# Patient Record
Sex: Female | Born: 1990 | Race: White | Hispanic: Yes | Marital: Single | Smoking: Former smoker
Health system: Southern US, Community
[De-identification: ages and names within clinical notes are randomized; demographics above are authoritative.]

## PROBLEM LIST (undated history)

## (undated) ENCOUNTER — Inpatient Hospital Stay (HOSPITAL_COMMUNITY): Payer: Self-pay

## (undated) ENCOUNTER — Inpatient Hospital Stay (HOSPITAL_COMMUNITY): Payer: Medicaid Other

## (undated) ENCOUNTER — Ambulatory Visit (HOSPITAL_COMMUNITY): Admission: EM | Source: Home / Self Care

## (undated) DIAGNOSIS — K429 Umbilical hernia without obstruction or gangrene: Secondary | ICD-10-CM

## (undated) DIAGNOSIS — K8020 Calculus of gallbladder without cholecystitis without obstruction: Secondary | ICD-10-CM

## (undated) DIAGNOSIS — T8859XA Other complications of anesthesia, initial encounter: Secondary | ICD-10-CM

## (undated) DIAGNOSIS — K219 Gastro-esophageal reflux disease without esophagitis: Secondary | ICD-10-CM

## (undated) DIAGNOSIS — D509 Iron deficiency anemia, unspecified: Secondary | ICD-10-CM

## (undated) DIAGNOSIS — J45909 Unspecified asthma, uncomplicated: Secondary | ICD-10-CM

## (undated) DIAGNOSIS — T4145XA Adverse effect of unspecified anesthetic, initial encounter: Secondary | ICD-10-CM

## (undated) DIAGNOSIS — O210 Mild hyperemesis gravidarum: Secondary | ICD-10-CM

## (undated) DIAGNOSIS — R011 Cardiac murmur, unspecified: Secondary | ICD-10-CM

## (undated) HISTORY — PX: PILONIDAL CYST / SINUS EXCISION: SUR543

---
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
## 2011-04-09 LAB — DIFFERENTIAL
Basophils Absolute: 0 K/uL (ref 0.0–0.1)
Basophils Relative: 0 % (ref 0–1)
Eosinophils Absolute: 0.1 K/uL (ref 0.0–1.2)
Eosinophils Relative: 0 % (ref 0–5)
Lymphocytes Relative: 13 % — ABNORMAL LOW (ref 24–48)
Lymphs Abs: 1.8 K/uL (ref 1.1–4.8)
Monocytes Absolute: 0.2 K/uL (ref 0.2–1.2)
Monocytes Relative: 2 % — ABNORMAL LOW (ref 3–11)
Neutro Abs: 11.8 K/uL — ABNORMAL HIGH (ref 1.7–8.0)
Neutrophils Relative %: 85 % — ABNORMAL HIGH (ref 43–71)

## 2011-04-09 LAB — CBC
HCT: 42 % (ref 36.0–49.0)
Hemoglobin: 14 g/dL (ref 12.0–16.0)
MCHC: 33.4 g/dL (ref 31.0–37.0)
MCV: 89.6 fL (ref 78.0–98.0)
Platelets: 266 K/uL (ref 150–400)
RBC: 4.69 MIL/uL (ref 3.80–5.70)
RDW: 13.9 % (ref 11.4–15.5)
WBC: 13.9 K/uL — ABNORMAL HIGH (ref 4.5–13.5)

## 2011-04-09 LAB — WET PREP, GENITAL
Trich, Wet Prep: NONE SEEN
Yeast Wet Prep HPF POC: NONE SEEN

## 2011-04-09 LAB — COMPREHENSIVE METABOLIC PANEL WITH GFR
ALT: 29 U/L (ref 0–35)
AST: 27 U/L (ref 0–37)
Albumin: 4.3 g/dL (ref 3.5–5.2)
Alkaline Phosphatase: 76 U/L (ref 47–119)
BUN: 8 mg/dL (ref 6–23)
CO2: 26 meq/L (ref 19–32)
Calcium: 9.8 mg/dL (ref 8.4–10.5)
Chloride: 104 meq/L (ref 96–112)
Creatinine, Ser: 0.51 mg/dL (ref 0.4–1.2)
Glucose, Bld: 105 mg/dL — ABNORMAL HIGH (ref 70–99)
Potassium: 4.1 meq/L (ref 3.5–5.1)
Sodium: 139 meq/L (ref 135–145)
Total Bilirubin: 0.7 mg/dL (ref 0.3–1.2)
Total Protein: 7.6 g/dL (ref 6.0–8.3)

## 2011-04-09 LAB — URINALYSIS, ROUTINE W REFLEX MICROSCOPIC
Bilirubin Urine: NEGATIVE
Glucose, UA: NEGATIVE mg/dL
Hgb urine dipstick: NEGATIVE
Ketones, ur: NEGATIVE mg/dL
Nitrite: NEGATIVE
Protein, ur: NEGATIVE mg/dL
Specific Gravity, Urine: 1.021 (ref 1.005–1.030)
Urobilinogen, UA: 0.2 mg/dL (ref 0.0–1.0)
pH: 7.5 (ref 5.0–8.0)

## 2011-04-09 LAB — GC/CHLAMYDIA PROBE AMP, GENITAL
Chlamydia, DNA Probe: NEGATIVE
GC Probe Amp, Genital: POSITIVE — AB

## 2011-04-09 LAB — POCT PREGNANCY, URINE: Preg Test, Ur: NEGATIVE

## 2011-05-07 NOTE — H&P (Signed)
Caitlin Sullivan, SESMA                ACCOUNT NO.:  [id number]   MEDICAL RECORD NO.:  [id number]          PATIENT TYPE:  EMS   LOCATION:  ED                           FACILITY:  [facility]   PHYSICIAN:  Anselm Pancoast. Weatherly, M.D.DATE OF BIRTH:  September 08, 1991   DATE OF ADMISSION:  05/24/2008  DATE OF DISCHARGE:                              HISTORY & PHYSICAL   PREOPERATIVE DIAGNOSIS:  Infected pilonidal cyst.   HISTORY:  Fawne Hughley is a 20 year old Latino who presented I think to  the [facility] either ER or Urgent Care yesterday and had a pilonidal cyst  that she said started being painful on Saturday.  She was drained in the  emergency room with a little small incision to the left of midline and  then referred to the pediatric surgical clinic this morning.  Dr.  Wyline Mood, a pediatric surgeon from [facility], has a clinic here twice a  week, and he examined her and thought that the abscess was not  adequately drained but he only does nonacute cases and asked if we would  manage the problem.  I said sure and sent to the emergency room at  [facility] where I am on call.  On questioning, she says that she has  never had previous problems until this past Saturday.  She is 20 years  and is in the tenth grade and has had no problems with chronic  infection.  Her mother speaks very little [place].  The patient speaks  excellent English, and it appears that she was placed on Vicodin and  Septra last evening from the clinic.   ALLERGIES:  NONE.   CHRONIC MEDICATIONS:  None.   Her last menstrual period was approximately 1 month ago.   PHYSICAL EXAMINATION IN THE EMERGENCY ROOM:  GENERAL:  She is a pleasant  female, uncomfortable but not toxic-appearing.  VITAL SIGNS:  Temperature was 98, blood pressure 104/78, pulse 94,  respirations 18.  HEENT:  Unremarkable.  LUNGS:  Clear.  CARDIAC:  Normal sinus rhythm.  ABDOMEN:  Soft, nontender.  In the pilonidal area, there is an obvious  much inflamed  fluctuant area with a little small quarter-inch stab wound  to the right of midline about 2 cm from actually the midline area.  There is one small crypt that you can see in the midline pilonidal area.  The area is markedly inflamed and inadequately drained.  EXTREMITIES:  Unremarkable.  CNS: Physiologic.   ADMISSION IMPRESSION:  Inadequately drained pilonidal cyst.   PLAN:  We will plan on proceeding to the OR for drainage and debridement  of this pilonidal cyst, and hopefully this will not recurrent and can  heal with secondary closure.  The patient and her mother are in  agreement with this plan.           ______________________________  Anselm Pancoast. Zachery Dakins, M.D.     WJW/MEDQ  D:  05/24/2008  T:  05/24/2008  Job:  [id number]

## 2011-05-07 NOTE — Op Note (Signed)
NAMEMARISSA, Sullivan                ACCOUNT NO.:  [id number]   MEDICAL RECORD NO.:  [id number]          PATIENT TYPE:  EMS   LOCATION:  ED                           FACILITY:  [facility]   PHYSICIAN:  Anselm Pancoast. Weatherly, M.D.DATE OF BIRTH:  17-Jan-1991   DATE OF PROCEDURE:  DATE OF DISCHARGE:                               OPERATIVE REPORT   PREOPERATIVE DIAGNOSIS:  Infected pilonidal cyst.   OPERATION:  Debridement and drainage of moderate size infected pilonidal  cyst.   ANESTHESIA:  General.   POSITION:  Prone.   HISTORY:  Caitlin Sullivan is a 20 year old Latino female who presented to  [facility] via Urgent Clinic for ER yesterday and had a pilonidal cyst that  she said had only been painful about 3 days, drained with local  anesthesia.  She was referred by Dr. [facility]  today.  He called and said that this needed to be more adequately  drained.  They only do chronic non-acute issues there and could we take  care of it.  I sent her to the emergency room at [facility] and on  physical exam she has an obvious large pilonidal abscess that has not  drained adequately and the little opening is not actually draining any  fluid at this time.  I gave her a gram of Ancef.  She had been started  on Septra DS and Vicodin tablets and permission was obtained to drain  this in the operating room.  Her white count was about 12,000 and her  last menstrual period was approximately a month ago.   PROCEDURE:  The patient was positioned on the OR table, induction of  general anesthesia in a prone position, and then the buttocks were taped  apart.  I shaved or clipped the surrounding hair and then draped it  after a Betadine prep.  With pressing on the cavity, after she was  asleep, there was frank purulence that was very foul-smelling that was  cultured, aerobic and anaerobically.  The hemostat was then placed and  you could feel that there was a little pocket where they have  actually  drained and a much larger pocket more centrally, and it does go to the  midline area and you could see a little crypt right at the midline area.  I excised just to the left opposite side of midline and then sharply  debrided down all the abscess cavity wall.  There was not any actual  hair in the area.  I then switched to the opposite side and cut into  where they had drained this little vein to the area centrally and then  removed a little bit more skin on the opposite side, and then removed  all the chronic abscess cavity and etc. down to normal tissue  circumferentially right on down  periosteally to fascia over the sacrum.  I cauterized the little bleeders, no actual sutures were needed, and  then waited a few minutes after flushing and washing the area and then  re-lightly cauterized the area.  I then used Iodoform 1 inch gauze that  was soaked in additional Betadine to pack the little area and then we  put 4x4 dressings over it.  I will see her in the office either Thursday  or Friday to change the dressing  and then we will do sitz baths or soak in the tub afterwards and  hopefully this will close up, but not reoccur.  The tissue was sent for  pathology exam plus the cultures which were aerobic and anaerobic.  We  will continue all the Septra DS and the Vicodin tablets and she will be  released after a short stay if she is comfortable.           ______________________________  Anselm Pancoast. Zachery Dakins, M.D.     WJW/MEDQ  D:  05/24/2008  T:  05/24/2008  Job:  [id number]   cc:   Bunnie Pion, MD  Fax: [phone]

## 2011-07-04 ENCOUNTER — Emergency Department (HOSPITAL_COMMUNITY)
Admission: EM | Admit: 2011-07-04 | Discharge: 2011-07-04 | Disposition: A | Discharge status: Home / Self Care | Payer: Medicaid Other | Attending: Emergency Medicine | Admitting: Emergency Medicine

## 2011-07-04 DIAGNOSIS — B9689 Other specified bacterial agents as the cause of diseases classified elsewhere: Secondary | ICD-10-CM | POA: Insufficient documentation

## 2011-07-04 DIAGNOSIS — N898 Other specified noninflammatory disorders of vagina: Secondary | ICD-10-CM | POA: Insufficient documentation

## 2011-07-04 DIAGNOSIS — A499 Bacterial infection, unspecified: Secondary | ICD-10-CM | POA: Insufficient documentation

## 2011-07-04 DIAGNOSIS — N949 Unspecified condition associated with female genital organs and menstrual cycle: Secondary | ICD-10-CM | POA: Insufficient documentation

## 2011-07-04 DIAGNOSIS — N946 Dysmenorrhea, unspecified: Secondary | ICD-10-CM | POA: Insufficient documentation

## 2011-07-04 DIAGNOSIS — N390 Urinary tract infection, site not specified: Secondary | ICD-10-CM | POA: Insufficient documentation

## 2011-07-04 DIAGNOSIS — N760 Acute vaginitis: Secondary | ICD-10-CM | POA: Insufficient documentation

## 2011-07-04 LAB — POCT I-STAT, CHEM 8
BUN: 8 mg/dL (ref 6–23)
Calcium, Ion: 1.15 mmol/L (ref 1.12–1.32)
Creatinine, Ser: 0.6 mg/dL (ref 0.50–1.10)
Glucose, Bld: 85 mg/dL (ref 70–99)
TCO2: 21 mmol/L (ref 0–100)

## 2011-07-04 LAB — URINALYSIS, ROUTINE W REFLEX MICROSCOPIC
Bilirubin Urine: NEGATIVE
Specific Gravity, Urine: 1.027 (ref 1.005–1.030)
pH: 7.5 (ref 5.0–8.0)

## 2011-07-04 LAB — URINE MICROSCOPIC-ADD ON

## 2011-07-04 LAB — WET PREP, GENITAL: Yeast Wet Prep HPF POC: NONE SEEN

## 2011-09-01 ENCOUNTER — Emergency Department (HOSPITAL_COMMUNITY)
Admission: EM | Admit: 2011-09-01 | Discharge: 2011-09-02 | Disposition: A | Discharge status: Home / Self Care | Payer: Self-pay | Attending: Emergency Medicine | Admitting: Emergency Medicine

## 2011-09-01 DIAGNOSIS — L02419 Cutaneous abscess of limb, unspecified: Secondary | ICD-10-CM | POA: Insufficient documentation

## 2011-09-01 DIAGNOSIS — L03119 Cellulitis of unspecified part of limb: Secondary | ICD-10-CM | POA: Insufficient documentation

## 2011-09-19 LAB — CULTURE, ROUTINE-ABSCESS
Culture: NO GROWTH
Gram Stain: NONE SEEN

## 2011-09-19 LAB — DIFFERENTIAL
Eosinophils Absolute: 0
Lymphocytes Relative: 12 — ABNORMAL LOW
Lymphs Abs: 1.4 — ABNORMAL LOW
Monocytes Relative: 8
Neutro Abs: 9.5 — ABNORMAL HIGH
Neutrophils Relative %: 80 — ABNORMAL HIGH

## 2011-09-19 LAB — POCT PREGNANCY, URINE
Operator id: 231701
Preg Test, Ur: NEGATIVE

## 2011-09-19 LAB — COMPREHENSIVE METABOLIC PANEL
ALT: 31
Calcium: 9.4
Glucose, Bld: 101 — ABNORMAL HIGH
Sodium: 137
Total Protein: 7.6

## 2011-09-19 LAB — CBC
Hemoglobin: 13.2
MCHC: 34.4
RDW: 13.3

## 2012-09-19 ENCOUNTER — Emergency Department (HOSPITAL_COMMUNITY): Payer: Self-pay

## 2012-09-19 ENCOUNTER — Encounter (HOSPITAL_COMMUNITY): Payer: Self-pay | Admitting: Emergency Medicine

## 2012-09-19 ENCOUNTER — Emergency Department (HOSPITAL_COMMUNITY)
Admission: EM | Admit: 2012-09-19 | Discharge: 2012-09-19 | Disposition: A | Discharge status: Home / Self Care | Payer: Medicaid Other | Attending: Emergency Medicine | Admitting: Emergency Medicine

## 2012-09-19 DIAGNOSIS — A499 Bacterial infection, unspecified: Secondary | ICD-10-CM | POA: Insufficient documentation

## 2012-09-19 DIAGNOSIS — N390 Urinary tract infection, site not specified: Secondary | ICD-10-CM | POA: Insufficient documentation

## 2012-09-19 DIAGNOSIS — Z711 Person with feared health complaint in whom no diagnosis is made: Secondary | ICD-10-CM

## 2012-09-19 DIAGNOSIS — Z202 Contact with and (suspected) exposure to infections with a predominantly sexual mode of transmission: Secondary | ICD-10-CM | POA: Insufficient documentation

## 2012-09-19 DIAGNOSIS — N83209 Unspecified ovarian cyst, unspecified side: Secondary | ICD-10-CM

## 2012-09-19 DIAGNOSIS — N760 Acute vaginitis: Secondary | ICD-10-CM | POA: Insufficient documentation

## 2012-09-19 DIAGNOSIS — B9689 Other specified bacterial agents as the cause of diseases classified elsewhere: Secondary | ICD-10-CM | POA: Insufficient documentation

## 2012-09-19 DIAGNOSIS — R1031 Right lower quadrant pain: Secondary | ICD-10-CM | POA: Insufficient documentation

## 2012-09-19 LAB — COMPREHENSIVE METABOLIC PANEL
BUN: 10 mg/dL (ref 6–23)
CO2: 21 mEq/L (ref 19–32)
Calcium: 9.5 mg/dL (ref 8.4–10.5)
GFR calc Af Amer: >90 mL/min (ref >90)
GFR calc non Af Amer: >90 mL/min (ref >90)
Glucose, Bld: 143 mg/dL — ABNORMAL HIGH (ref 70–99)
Total Protein: 7.9 g/dL (ref 6.0–8.3)

## 2012-09-19 LAB — URINE MICROSCOPIC-ADD ON

## 2012-09-19 LAB — CBC WITH DIFFERENTIAL/PLATELET
Basophils Absolute: 0 10*3/uL (ref 0.0–0.1)
HCT: 39.9 % (ref 36.0–46.0)
Hemoglobin: 14 g/dL (ref 12.0–15.0)
Lymphocytes Relative: 15 % (ref 12–46)
Monocytes Absolute: 0.4 10*3/uL (ref 0.1–1.0)
Monocytes Relative: 3 % (ref 3–12)
Neutro Abs: 9 10*3/uL — ABNORMAL HIGH (ref 1.7–7.7)
Neutrophils Relative %: 81 % — ABNORMAL HIGH (ref 43–77)
WBC: 11 10*3/uL — ABNORMAL HIGH (ref 4.0–10.5)

## 2012-09-19 LAB — LIPASE, BLOOD: Lipase: 19 U/L (ref 11–59)

## 2012-09-19 LAB — URINALYSIS, ROUTINE W REFLEX MICROSCOPIC
Glucose, UA: NEGATIVE mg/dL
Specific Gravity, Urine: 1.028 (ref 1.005–1.030)
pH: 7.5 (ref 5.0–8.0)

## 2012-09-19 LAB — POCT PREGNANCY, URINE: Preg Test, Ur: NEGATIVE

## 2012-09-19 LAB — WET PREP, GENITAL: Trich, Wet Prep: NONE SEEN

## 2012-09-19 MED ORDER — AZITHROMYCIN 250 MG PO TABS
1000.0000 mg | ORAL_TABLET | Freq: Once | ORAL | Status: AC
  Administered 2012-09-19: 1000 mg via ORAL
  Filled 2012-09-19: qty 4

## 2012-09-19 MED ORDER — ALBUTEROL SULFATE (5 MG/ML) 0.5% IN NEBU
5.0000 mg | INHALATION_SOLUTION | Freq: Once | RESPIRATORY_TRACT | Status: AC
  Administered 2012-09-19: 5 mg via RESPIRATORY_TRACT
  Filled 2012-09-19: qty 1

## 2012-09-19 MED ORDER — SULFAMETHOXAZOLE-TRIMETHOPRIM 800-160 MG PO TABS: 1.0000 | ORAL_TABLET | Freq: Two times a day (BID) | ORAL | Status: DC

## 2012-09-19 MED ORDER — METRONIDAZOLE 500 MG PO TABS: 500.0000 mg | ORAL_TABLET | Freq: Two times a day (BID) | ORAL | Status: DC

## 2012-09-19 MED ORDER — IOHEXOL 300 MG/ML  SOLN
100.0000 mL | Freq: Once | INTRAMUSCULAR | Status: AC | PRN
  Administered 2012-09-19: 100 mL via INTRAVENOUS

## 2012-09-19 MED ORDER — ONDANSETRON HCL 4 MG/2ML IJ SOLN
4.0000 mg | Freq: Once | INTRAMUSCULAR | Status: AC
  Administered 2012-09-19: 4 mg via INTRAVENOUS
  Filled 2012-09-19: qty 2

## 2012-09-19 MED ORDER — LIDOCAINE HCL 1 % IJ SOLN
INTRAMUSCULAR | Status: AC
  Administered 2012-09-19: 2.1 mL
  Filled 2012-09-19: qty 20

## 2012-09-19 MED ORDER — SODIUM CHLORIDE 0.9 % IV BOLUS (SEPSIS)
1000.0000 mL | Freq: Once | INTRAVENOUS | Status: AC
  Administered 2012-09-19: 1000 mL via INTRAVENOUS

## 2012-09-19 MED ORDER — CEFTRIAXONE SODIUM 250 MG IJ SOLR
250.0000 mg | Freq: Once | INTRAMUSCULAR | Status: AC
  Administered 2012-09-19: 250 mg via INTRAMUSCULAR
  Filled 2012-09-19: qty 250

## 2012-09-19 MED ORDER — ONDANSETRON HCL 4 MG PO TABS: 4.0000 mg | ORAL_TABLET | Freq: Four times a day (QID) | ORAL | Status: DC

## 2012-09-19 NOTE — ED Provider Notes (Signed)
History     CSN: [id number]  Arrival date & time 09/19/12  4696   First MD Initiated Contact with Patient 09/19/12 1008      Chief Complaint  Patient presents with  . Emesis    (Consider location/radiation/quality/duration/timing/severity/associated sxs/prior treatment) The history is provided by the patient and medical records.    Caitlin Sullivan is a 21 y.o. female presents to the emergency department complaining of vomiting.  The onset of the symptoms was  abrupt starting 4 hours ago.  The patient has associated nausea, shortness of breath, chest discomfort, abdominal pain, dizziness.  The symptoms have been  intermittent, gradually worsened.  nothing makes the symptoms worse and nothing makes symptoms better.  The patient denies fever, chills, headache, changes in vision, neck pain, diarrhea, syncope.  Pt states she awoke at 6am and began vomiting. She states she vomited off and on for a few hours and then became anxious and felt as if she couldn't breathe. She presented to triage with hyperventilation and c/o dizziness.  Pt states Hx of many UTIs as a child, but no corrective surgery needed after urethrocystogram.  Pt currently denies dysuria, frequency or urgency.   Pt Denies exogenous estrogen, long trips, surgery, periods of immobilization.  Pt is sexually active with 1 partner and intermittent use of a condom.  She does not take an OCP.     History reviewed. No pertinent past medical history.  No past surgical history on file.  No family history on file.  History  Substance Use Topics  . Smoking status: Current Every Day Smoker -- 0.2 packs/day    Types: Cigarettes  . Smokeless tobacco: Never Used  . Alcohol Use: No    OB History    Grav Para Term Preterm Abortions TAB SAB Ect Mult Living                  Review of Systems  Constitutional: Negative for fever, chills, diaphoresis, appetite change, fatigue and unexpected weight change.  HENT: Negative for congestion,  mouth sores, trouble swallowing, neck pain, neck stiffness and postnasal drip.   Respiratory: Positive for cough, shortness of breath and wheezing. Negative for chest tightness and stridor.   Cardiovascular: Negative for chest pain and palpitations.  Gastrointestinal: Positive for nausea, vomiting and abdominal pain. Negative for diarrhea, constipation, blood in stool, abdominal distention and rectal pain.  Genitourinary: Negative for dysuria, urgency, frequency, hematuria, flank pain and difficulty urinating.  Musculoskeletal: Negative for back pain.  Skin: Negative for rash.  Neurological: Positive for dizziness. Negative for weakness.  Hematological: Negative for adenopathy.  Psychiatric/Behavioral: Negative for confusion.  All other systems reviewed and are negative.    Allergies  Review of patient's allergies indicates no known allergies.  Home Medications   Current Outpatient Rx  Name Route Sig Dispense Refill  . METRONIDAZOLE 500 MG PO TABS Oral Take 1 tablet (500 mg total) by mouth 2 (two) times daily. 14 tablet 0  . ONDANSETRON HCL 4 MG PO TABS Oral Take 1 tablet (4 mg total) by mouth every 6 (six) hours. 12 tablet 0  . SULFAMETHOXAZOLE-TRIMETHOPRIM 800-160 MG PO TABS Oral Take 1 tablet by mouth every 12 (twelve) hours. 20 tablet 0    BP 116/76  Pulse 59  Temp 97.8 F (36.6 C) (Oral)  SpO2 100%  LMP 09/18/2012  Physical Exam  Nursing note and vitals reviewed. Constitutional: She is oriented to person, place, and time. She appears well-developed and well-nourished. No distress.  HENT:  Head: Normocephalic and atraumatic.  Right Ear: Tympanic membrane, external ear and ear canal normal.  Left Ear: Tympanic membrane, external ear and ear canal normal.  Nose: Nose normal. Right sinus exhibits no maxillary sinus tenderness and no frontal sinus tenderness. Left sinus exhibits no maxillary sinus tenderness and no frontal sinus tenderness.  Mouth/Throat: Uvula is midline,  oropharynx is clear and moist and mucous membranes are normal. No oropharyngeal exudate.  Eyes: Conjunctivae normal are normal. Pupils are equal, round, and reactive to light. No scleral icterus.  Neck: Normal range of motion. Neck supple. No thyromegaly present.  Cardiovascular: Normal rate, regular rhythm, S1 normal, S2 normal, normal heart sounds, intact distal pulses and normal pulses.  PMI is not displaced.  Exam reveals no gallop and no friction rub.   No murmur heard. Pulmonary/Chest: Effort normal. No respiratory distress. She has decreased breath sounds (throughout). She has wheezes (very mild expiratory) in the right lower field and the left lower field. She has no rhonchi. She has no rales.  Abdominal: Soft. Normal appearance and bowel sounds are normal. She exhibits no mass. There is tenderness in the right upper quadrant, right lower quadrant, epigastric area and left upper quadrant. There is rebound (RLQ) and guarding. There is no rigidity, no CVA tenderness and negative Murphy's sign.  Genitourinary: Pelvic exam was performed with patient supine. There is no rash, tenderness, lesion or injury on the right labia. There is no rash, tenderness, lesion or injury on the left labia. Uterus is tender. Uterus is not deviated, not enlarged and not fixed. Cervix exhibits no motion tenderness, no discharge and no friability. Right adnexum displays tenderness. Right adnexum displays no mass and no fullness. Left adnexum displays no mass, no tenderness and no fullness. There is tenderness (R vaginal wall with sample collection) and bleeding around the vagina. No erythema around the vagina. No signs of injury around the vagina. No vaginal discharge found.  Musculoskeletal: Normal range of motion. She exhibits no edema.  Lymphadenopathy:    She has no cervical adenopathy.  Neurological: She is alert and oriented to person, place, and time.       Speech is clear and goal oriented Moves extremities  without ataxia  Skin: Skin is warm and dry. No rash noted. She is not diaphoretic. No erythema.  Psychiatric: She has a normal mood and affect.    ED Course  Procedures (including critical care time)  Labs Reviewed  URINALYSIS, ROUTINE W REFLEX MICROSCOPIC - Abnormal; Notable for the following:    Hgb urine dipstick SMALL (*)     Ketones, ur 15 (*)     Leukocytes, UA MODERATE (*)     All other components within normal limits  CBC WITH DIFFERENTIAL - Abnormal; Notable for the following:    WBC 11.0 (*)     Neutrophils Relative 81 (*)     Neutro Abs 9.0 (*)     All other components within normal limits  COMPREHENSIVE METABOLIC PANEL - Abnormal; Notable for the following:    Glucose, Bld 143 (*)     All other components within normal limits  WET PREP, GENITAL - Abnormal; Notable for the following:    Clue Cells Wet Prep HPF POC FEW (*)     WBC, Wet Prep HPF POC FEW (*)     All other components within normal limits  URINE MICROSCOPIC-ADD ON - Abnormal; Notable for the following:    Bacteria, UA MANY (*)     All other components within  normal limits  LIPASE, BLOOD  POCT PREGNANCY, URINE  GC/CHLAMYDIA PROBE AMP, GENITAL   US Transvaginal Non-ob  09/19/2012  *RADIOLOGY REPORT*  Clinical Data:  Right lower quadrant/right pelvic pain, evaluate for torsion  TRANSABDOMINAL AND TRANSVAGINAL ULTRASOUND OF PELVIS DOPPLER ULTRASOUND OF OVARIES  Technique:  Both transabdominal and transvaginal ultrasound examinations of the pelvis were performed. Transabdominal technique was performed for global imaging of the pelvis including uterus, ovaries, adnexal regions, and pelvic cul-de-sac.  It was necessary to proceed with endovaginal exam following the transabdominal exam to visualize the endometrium.  Color and duplex Doppler ultrasound was utilized to evaluate blood flow to the ovaries.  Comparison:  None.  Findings:  Uterus:  Normal in size and appearance, measuring 8.5 x 3.5 x 5.1 cm.  Endometrium:   Normal in thickness and appearance, measuring 3 mm.  Right ovary: Measures 4.0 x 2.3 x 2.5 cm and is notable for a probable hemorrhagic/corpus luteal cyst.  Left ovary: Normal appearance/no adnexal mass, measuring 3.4 x 1.9 x 1.7 cm.  Pulsed Doppler evaluation demonstrates normal low-resistance arterial and venous waveforms in both ovaries.  Additional comments:  No free fluid.  IMPRESSION: Normal exam.  No evidence of pelvic mass or other significant abnormality.  Suspected right hemorrhagic/corpus luteal cyst.  No sonographic evidence for ovarian torsion.   Original Report Authenticated By: Charline Bills, M.D.    US Pelvis Complete  09/19/2012  *RADIOLOGY REPORT*  Clinical Data:  Right lower quadrant/right pelvic pain, evaluate for torsion  TRANSABDOMINAL AND TRANSVAGINAL ULTRASOUND OF PELVIS DOPPLER ULTRASOUND OF OVARIES  Technique:  Both transabdominal and transvaginal ultrasound examinations of the pelvis were performed. Transabdominal technique was performed for global imaging of the pelvis including uterus, ovaries, adnexal regions, and pelvic cul-de-sac.  It was necessary to proceed with endovaginal exam following the transabdominal exam to visualize the endometrium.  Color and duplex Doppler ultrasound was utilized to evaluate blood flow to the ovaries.  Comparison:  None.  Findings:  Uterus:  Normal in size and appearance, measuring 8.5 x 3.5 x 5.1 cm.  Endometrium:  Normal in thickness and appearance, measuring 3 mm.  Right ovary: Measures 4.0 x 2.3 x 2.5 cm and is notable for a probable hemorrhagic/corpus luteal cyst.  Left ovary: Normal appearance/no adnexal mass, measuring 3.4 x 1.9 x 1.7 cm.  Pulsed Doppler evaluation demonstrates normal low-resistance arterial and venous waveforms in both ovaries.  Additional comments:  No free fluid.  IMPRESSION: Normal exam.  No evidence of pelvic mass or other significant abnormality.  Suspected right hemorrhagic/corpus luteal cyst.  No sonographic evidence  for ovarian torsion.   Original Report Authenticated By: Charline Bills, M.D.    Ct Abdomen Pelvis W Contrast  09/19/2012  *RADIOLOGY REPORT*  Clinical Data: Right lower quadrant pain, vomiting  CT ABDOMEN AND PELVIS WITH CONTRAST  Technique:  Multidetector CT imaging of the abdomen and pelvis was performed following the standard protocol during bolus administration of intravenous contrast.  Contrast: OMNIPAQUE IOHEXOL 300 MG/ML  SOLN  Comparison: None.  Findings: Lung bases are clear.  Spleen, pancreas, and adrenal glands are within normal limits.  Gallbladder is unremarkable.  No intrahepatic or extrahepatic ductal dilatation.  Kidneys are within normal limits.  No hydronephrosis.  No evidence of bowel obstruction.  Normal appendix.  No evidence of abdominal aortic aneurysm.  No suspicious abdominopelvic lymphadenopathy.  Uterus and bilateral ovaries are unremarkable.  Trace pelvic ascites, likely physiologic.  Bladder is within normal limits.  Visualized osseous structures are within  normal limits.  IMPRESSION: Normal appendix.  No evidence of bowel obstruction.  No CT findings to account for the patient's abdominal pain.   Original Report Authenticated By: Charline Bills, M.D.    [place] Art/ven Flow Abd Pelv Doppler  09/19/2012  *RADIOLOGY REPORT*  Clinical Data:  Right lower quadrant/right pelvic pain, evaluate for torsion  TRANSABDOMINAL AND TRANSVAGINAL ULTRASOUND OF PELVIS DOPPLER ULTRASOUND OF OVARIES  Technique:  Both transabdominal and transvaginal ultrasound examinations of the pelvis were performed. Transabdominal technique was performed for global imaging of the pelvis including uterus, ovaries, adnexal regions, and pelvic cul-de-sac.  It was necessary to proceed with endovaginal exam following the transabdominal exam to visualize the endometrium.  Color and duplex Doppler ultrasound was utilized to evaluate blood flow to the ovaries.  Comparison:  None.  Findings:  Uterus:  Normal in size  and appearance, measuring 8.5 x 3.5 x 5.1 cm.  Endometrium:  Normal in thickness and appearance, measuring 3 mm.  Right ovary: Measures 4.0 x 2.3 x 2.5 cm and is notable for a probable hemorrhagic/corpus luteal cyst.  Left ovary: Normal appearance/no adnexal mass, measuring 3.4 x 1.9 x 1.7 cm.  Pulsed Doppler evaluation demonstrates normal low-resistance arterial and venous waveforms in both ovaries.  Additional comments:  No free fluid.  IMPRESSION: Normal exam.  No evidence of pelvic mass or other significant abnormality.  Suspected right hemorrhagic/corpus luteal cyst.  No sonographic evidence for ovarian torsion.   Original Report Authenticated By: Charline Bills, M.D.    Results for orders placed during the hospital encounter of 09/19/12  URINALYSIS, ROUTINE W REFLEX MICROSCOPIC      Component Value Range   Color, Urine YELLOW  YELLOW   APPearance CLEAR  CLEAR   Specific Gravity, Urine 1.028  1.005 - 1.030   pH 7.5  5.0 - 8.0   Glucose, UA NEGATIVE  NEGATIVE mg/dL   Hgb urine dipstick SMALL (*) NEGATIVE   Bilirubin Urine NEGATIVE  NEGATIVE   Ketones, ur 15 (*) NEGATIVE mg/dL   Protein, ur NEGATIVE  NEGATIVE mg/dL   Urobilinogen, UA 0.2  0.0 - 1.0 mg/dL   Nitrite NEGATIVE  NEGATIVE   Leukocytes, UA MODERATE (*) NEGATIVE  CBC WITH DIFFERENTIAL      Component Value Range   WBC 11.0 (*) 4.0 - 10.5 K/uL   RBC 4.67  3.87 - 5.11 MIL/uL   Hemoglobin 14.0  12.0 - 15.0 g/dL   HCT 16.1  09.6 - 04.5 %   MCV 85.4  78.0 - 100.0 fL   MCH 30.0  26.0 - 34.0 pg   MCHC 35.1  30.0 - 36.0 g/dL   RDW 40.9  81.1 - 91.4 %   Platelets 301  150 - 400 K/uL   Neutrophils Relative 81 (*) 43 - 77 %   Neutro Abs 9.0 (*) 1.7 - 7.7 K/uL   Lymphocytes Relative 15  12 - 46 %   Lymphs Abs 1.6  0.7 - 4.0 K/uL   Monocytes Relative 3  3 - 12 %   Monocytes Absolute 0.4  0.1 - 1.0 K/uL   Eosinophils Relative 0  0 - 5 %   Eosinophils Absolute 0.0  0.0 - 0.7 K/uL   Basophils Relative 0  0 - 1 %   Basophils Absolute  0.0  0.0 - 0.1 K/uL  COMPREHENSIVE METABOLIC PANEL      Component Value Range   Sodium 137  135 - 145 mEq/L   Potassium 3.5  3.5 - 5.1 mEq/L  Chloride 103  96 - 112 mEq/L   CO2 21  19 - 32 mEq/L   Glucose, Bld 143 (*) 70 - 99 mg/dL   BUN 10  6 - 23 mg/dL   Creatinine, Ser 4.09  0.50 - 1.10 mg/dL   Calcium 9.5  8.4 - 81.1 mg/dL   Total Protein 7.9  6.0 - 8.3 g/dL   Albumin 4.3  3.5 - 5.2 g/dL   AST 15  0 - 37 U/L   ALT 13  0 - 35 U/L   Alkaline Phosphatase 63  39 - 117 U/L   Total Bilirubin 0.4  0.3 - 1.2 mg/dL   GFR calc non Af Amer >90  >90 mL/min   GFR calc Af Amer >90  >90 mL/min  LIPASE, BLOOD      Component Value Range   Lipase 19  11 - 59 U/L  WET PREP, GENITAL      Component Value Range   Yeast Wet Prep HPF POC NONE SEEN  NONE SEEN   Trich, Wet Prep NONE SEEN  NONE SEEN   Clue Cells Wet Prep HPF POC FEW (*) NONE SEEN   WBC, Wet Prep HPF POC FEW (*) NONE SEEN  POCT PREGNANCY, URINE      Component Value Range   Preg Test, Ur NEGATIVE  NEGATIVE  URINE MICROSCOPIC-ADD ON      Component Value Range   Squamous Epithelial / LPF RARE  RARE   WBC, UA 7-10  <3 WBC/hpf   RBC / HPF 0-2  <3 RBC/hpf   Bacteria, UA MANY (*) RARE   US Transvaginal Non-ob  09/19/2012  *RADIOLOGY REPORT*  Clinical Data:  Right lower quadrant/right pelvic pain, evaluate for torsion  TRANSABDOMINAL AND TRANSVAGINAL ULTRASOUND OF PELVIS DOPPLER ULTRASOUND OF OVARIES  Technique:  Both transabdominal and transvaginal ultrasound examinations of the pelvis were performed. Transabdominal technique was performed for global imaging of the pelvis including uterus, ovaries, adnexal regions, and pelvic cul-de-sac.  It was necessary to proceed with endovaginal exam following the transabdominal exam to visualize the endometrium.  Color and duplex Doppler ultrasound was utilized to evaluate blood flow to the ovaries.  Comparison:  None.  Findings:  Uterus:  Normal in size and appearance, measuring 8.5 x 3.5 x 5.1 cm.   Endometrium:  Normal in thickness and appearance, measuring 3 mm.  Right ovary: Measures 4.0 x 2.3 x 2.5 cm and is notable for a probable hemorrhagic/corpus luteal cyst.  Left ovary: Normal appearance/no adnexal mass, measuring 3.4 x 1.9 x 1.7 cm.  Pulsed Doppler evaluation demonstrates normal low-resistance arterial and venous waveforms in both ovaries.  Additional comments:  No free fluid.  IMPRESSION: Normal exam.  No evidence of pelvic mass or other significant abnormality.  Suspected right hemorrhagic/corpus luteal cyst.  No sonographic evidence for ovarian torsion.   Original Report Authenticated By: Charline Bills, M.D.    US Pelvis Complete  09/19/2012  *RADIOLOGY REPORT*  Clinical Data:  Right lower quadrant/right pelvic pain, evaluate for torsion  TRANSABDOMINAL AND TRANSVAGINAL ULTRASOUND OF PELVIS DOPPLER ULTRASOUND OF OVARIES  Technique:  Both transabdominal and transvaginal ultrasound examinations of the pelvis were performed. Transabdominal technique was performed for global imaging of the pelvis including uterus, ovaries, adnexal regions, and pelvic cul-de-sac.  It was necessary to proceed with endovaginal exam following the transabdominal exam to visualize the endometrium.  Color and duplex Doppler ultrasound was utilized to evaluate blood flow to the ovaries.  Comparison:  None.  Findings:  Uterus:  Normal in size and appearance, measuring 8.5 x 3.5 x 5.1 cm.  Endometrium:  Normal in thickness and appearance, measuring 3 mm.  Right ovary: Measures 4.0 x 2.3 x 2.5 cm and is notable for a probable hemorrhagic/corpus luteal cyst.  Left ovary: Normal appearance/no adnexal mass, measuring 3.4 x 1.9 x 1.7 cm.  Pulsed Doppler evaluation demonstrates normal low-resistance arterial and venous waveforms in both ovaries.  Additional comments:  No free fluid.  IMPRESSION: Normal exam.  No evidence of pelvic mass or other significant abnormality.  Suspected right hemorrhagic/corpus luteal cyst.  No  sonographic evidence for ovarian torsion.   Original Report Authenticated By: Charline Bills, M.D.    Ct Abdomen Pelvis W Contrast  09/19/2012  *RADIOLOGY REPORT*  Clinical Data: Right lower quadrant pain, vomiting  CT ABDOMEN AND PELVIS WITH CONTRAST  Technique:  Multidetector CT imaging of the abdomen and pelvis was performed following the standard protocol during bolus administration of intravenous contrast.  Contrast: OMNIPAQUE IOHEXOL 300 MG/ML  SOLN  Comparison: None.  Findings: Lung bases are clear.  Spleen, pancreas, and adrenal glands are within normal limits.  Gallbladder is unremarkable.  No intrahepatic or extrahepatic ductal dilatation.  Kidneys are within normal limits.  No hydronephrosis.  No evidence of bowel obstruction.  Normal appendix.  No evidence of abdominal aortic aneurysm.  No suspicious abdominopelvic lymphadenopathy.  Uterus and bilateral ovaries are unremarkable.  Trace pelvic ascites, likely physiologic.  Bladder is within normal limits.  Visualized osseous structures are within normal limits.  IMPRESSION: Normal appendix.  No evidence of bowel obstruction.  No CT findings to account for the patient's abdominal pain.   Original Report Authenticated By: Charline Bills, M.D.    [place] Art/ven Flow Abd Pelv Doppler  09/19/2012  *RADIOLOGY REPORT*  Clinical Data:  Right lower quadrant/right pelvic pain, evaluate for torsion  TRANSABDOMINAL AND TRANSVAGINAL ULTRASOUND OF PELVIS DOPPLER ULTRASOUND OF OVARIES  Technique:  Both transabdominal and transvaginal ultrasound examinations of the pelvis were performed. Transabdominal technique was performed for global imaging of the pelvis including uterus, ovaries, adnexal regions, and pelvic cul-de-sac.  It was necessary to proceed with endovaginal exam following the transabdominal exam to visualize the endometrium.  Color and duplex Doppler ultrasound was utilized to evaluate blood flow to the ovaries.  Comparison:  None.  Findings:   Uterus:  Normal in size and appearance, measuring 8.5 x 3.5 x 5.1 cm.  Endometrium:  Normal in thickness and appearance, measuring 3 mm.  Right ovary: Measures 4.0 x 2.3 x 2.5 cm and is notable for a probable hemorrhagic/corpus luteal cyst.  Left ovary: Normal appearance/no adnexal mass, measuring 3.4 x 1.9 x 1.7 cm.  Pulsed Doppler evaluation demonstrates normal low-resistance arterial and venous waveforms in both ovaries.  Additional comments:  No free fluid.  IMPRESSION: Normal exam.  No evidence of pelvic mass or other significant abnormality.  Suspected right hemorrhagic/corpus luteal cyst.  No sonographic evidence for ovarian torsion.   Original Report Authenticated By: Charline Bills, M.D.     1. BV (bacterial vaginosis)   2. Concern about sexually transmitted disease in female without diagnosis   3. UTI (lower urinary tract infection)   4. Hemorrhagic ovarian cyst       MDM  Caitlin Sullivan presents with abdominal pain and emesis.  Exam with R adnexal tenderness and RLQ abdominal rebound tenderness.  Concern for PID vs ovarian torsion vs appendicitis.  Pt UPT negative. CBC with mild leukocytosis at 11.0.  CMP negative, Lipase WNL.  UA cath specimen with evidence of UTI.  Wet prep with clue cells and a few WBCs.  I will treat prophylactically for GC/chlamydia here and send her home with an Rx for BV and the UTI.  CT without evidence of appendicitis.  [place] without evidence of ovarian torsion, but suspected right hemorrhagic/corpus luteal cyst.  Pt pain and nausea managed here in the Emergency Department.  Pt tolerating PO fluids without difficulty.  I will discharge home with nausea medication and instructions for GYN follow-up.  I have discussed all these findings with the patient and her mother.  I have also discussed reasons to return immediately to the ER.  Patient expresses understanding and agrees with plan.  1. Medications: metronidazole, bactrim, zofran 2. Treatment: rest, drink plenty of  fluids, take medications as prescribed 3. Follow Up: with GYN          Dierdre Forth, PA-C 09/19/12 1332

## 2012-09-19 NOTE — ED Notes (Signed)
Pt w/ onset of emesis this a.m. At 0630, emesis x 20 per pt. Now w/ hyperventilation, hands and face numb. Brown paper bag used to help manage breathing, O2 SAT 100%

## 2012-09-19 NOTE — ED Notes (Signed)
Respiratory called

## 2012-09-20 NOTE — ED Provider Notes (Signed)
Medical screening examination/treatment/procedure(s) were performed by non-physician practitioner and as supervising physician I was immediately available for consultation/collaboration.   Hiral Lukasiewicz E Amesha Bailey, MD 09/20/12 0708 

## 2012-09-21 LAB — GC/CHLAMYDIA PROBE AMP, GENITAL: Chlamydia, DNA Probe: NEGATIVE

## 2012-11-29 ENCOUNTER — Emergency Department (HOSPITAL_COMMUNITY)
Admission: EM | Admit: 2012-11-29 | Discharge: 2012-11-29 | Disposition: A | Discharge status: Home / Self Care | Payer: Medicaid Other | Attending: Emergency Medicine | Admitting: Emergency Medicine

## 2012-11-29 ENCOUNTER — Emergency Department (HOSPITAL_COMMUNITY): Payer: Medicaid Other

## 2012-11-29 ENCOUNTER — Encounter (HOSPITAL_COMMUNITY): Payer: Self-pay | Admitting: *Deleted

## 2012-11-29 DIAGNOSIS — O219 Vomiting of pregnancy, unspecified: Secondary | ICD-10-CM | POA: Insufficient documentation

## 2012-11-29 DIAGNOSIS — F17200 Nicotine dependence, unspecified, uncomplicated: Secondary | ICD-10-CM | POA: Insufficient documentation

## 2012-11-29 DIAGNOSIS — J029 Acute pharyngitis, unspecified: Secondary | ICD-10-CM | POA: Insufficient documentation

## 2012-11-29 DIAGNOSIS — O200 Threatened abortion: Secondary | ICD-10-CM | POA: Insufficient documentation

## 2012-11-29 DIAGNOSIS — J3489 Other specified disorders of nose and nasal sinuses: Secondary | ICD-10-CM | POA: Insufficient documentation

## 2012-11-29 DIAGNOSIS — J069 Acute upper respiratory infection, unspecified: Secondary | ICD-10-CM | POA: Insufficient documentation

## 2012-11-29 LAB — COMPREHENSIVE METABOLIC PANEL
ALT: 10 U/L (ref 0–35)
AST: 15 U/L (ref 0–37)
Albumin: 3.7 g/dL (ref 3.5–5.2)
CO2: 24 mEq/L (ref 19–32)
Calcium: 9.3 mg/dL (ref 8.4–10.5)
Creatinine, Ser: 0.52 mg/dL (ref 0.50–1.10)
GFR calc non Af Amer: >90 mL/min (ref >90)
Sodium: 138 mEq/L (ref 135–145)
Total Protein: 7.4 g/dL (ref 6.0–8.3)

## 2012-11-29 LAB — CBC WITH DIFFERENTIAL/PLATELET
Basophils Absolute: 0.1 10*3/uL (ref 0.0–0.1)
Basophils Relative: 0 % (ref 0–1)
Eosinophils Absolute: 0.1 10*3/uL (ref 0.0–0.7)
Eosinophils Relative: 1 % (ref 0–5)
HCT: 38.5 % (ref 36.0–46.0)
MCH: 30.4 pg (ref 26.0–34.0)
MCHC: 34.5 g/dL (ref 30.0–36.0)
MCV: 88.1 fL (ref 78.0–100.0)
Monocytes Absolute: 1.3 10*3/uL — ABNORMAL HIGH (ref 0.1–1.0)
Platelets: 262 10*3/uL (ref 150–400)
RDW: 13.3 % (ref 11.5–15.5)
WBC: 11.6 10*3/uL — ABNORMAL HIGH (ref 4.0–10.5)

## 2012-11-29 LAB — URINALYSIS, ROUTINE W REFLEX MICROSCOPIC
Bilirubin Urine: NEGATIVE
Hgb urine dipstick: NEGATIVE
Nitrite: NEGATIVE
Protein, ur: NEGATIVE mg/dL
Specific Gravity, Urine: 1.03 (ref 1.005–1.030)
Urobilinogen, UA: 1 mg/dL (ref 0.0–1.0)

## 2012-11-29 LAB — HCG, QUANTITATIVE, PREGNANCY: hCG, Beta Chain, Quant, S: 22326 m[IU]/mL — ABNORMAL HIGH (ref <5)

## 2012-11-29 LAB — RAPID STREP SCREEN (MED CTR MEBANE ONLY): Streptococcus, Group A Screen (Direct): NEGATIVE

## 2012-11-29 MED ORDER — SODIUM CHLORIDE 0.9 % IV SOLN: INTRAVENOUS | Status: DC

## 2012-11-29 MED ORDER — ONDANSETRON HCL 4 MG/2ML IJ SOLN
4.0000 mg | Freq: Once | INTRAMUSCULAR | Status: AC
  Administered 2012-11-29: 4 mg via INTRAVENOUS
  Filled 2012-11-29: qty 2

## 2012-11-29 MED ORDER — PRENATAL VITAMINS 28-0.8 MG PO TABS: 1.0000 | ORAL_TABLET | Freq: Every day | ORAL | Status: DC

## 2012-11-29 MED ORDER — PROMETHAZINE HCL 25 MG PO TABS: 25.0000 mg | ORAL_TABLET | Freq: Four times a day (QID) | ORAL | Status: DC | PRN

## 2012-11-29 MED ORDER — SODIUM CHLORIDE 0.9 % IV BOLUS (SEPSIS)
250.0000 mL | Freq: Once | INTRAVENOUS | Status: AC
  Administered 2012-11-29: 250 mL via INTRAVENOUS

## 2012-11-29 MED ORDER — ALBUTEROL SULFATE HFA 108 (90 BASE) MCG/ACT IN AERS: 1.0000 | INHALATION_SPRAY | Freq: Four times a day (QID) | RESPIRATORY_TRACT | Status: DC | PRN

## 2012-11-29 NOTE — ED Notes (Signed)
Patient currently resting quietly in bed; no respiratory or acute distress noted.  Patient currently pending for ultrasound; denies any needs at this time; will continue to monitor.

## 2012-11-29 NOTE — ED Notes (Signed)
The pt is c/o a cold cough sorethroat abd pain and difficulty breathing for 2-3 days from chest congestion nasal congestion and vomiting.  lmp oct hx irregular [periods

## 2012-11-29 NOTE — ED Provider Notes (Signed)
History     CSN: [id number]  Arrival date & time 11/29/12  0024   First MD Initiated Contact with Patient 11/29/12 0131      Chief Complaint  Patient presents with  . Abdominal Pain    (Consider location/radiation/quality/duration/timing/severity/associated sxs/prior treatment) Patient is a 21 y.o. female presenting with abdominal pain.  Abdominal Pain The primary symptoms of the illness include nausea and vomiting. The primary symptoms of the illness do not include abdominal pain, fever, diarrhea, dysuria, vaginal discharge or vaginal bleeding.  Symptoms associated with the illness do not include hematuria or back pain.   patient is a 21 year old female complaining of bilateral lower quadrant abdominal pain has been present for a few weeks. Got worse about 3 days ago nausea and vomiting patient said 10 times today 5-10 times yesterday no diarrhea. Patient's last menstrual period was mid October however she says she is usually irregular. She's also had upper respiratory infection type symptoms with sore throat congestion has never had strep throat in the past. Patient also states at times she feels short of breath and feels like his she's wheezing.  History reviewed. No pertinent past medical history.  History reviewed. No pertinent past surgical history.  No family history on file.  History  Substance Use Topics  . Smoking status: Current Every Day Smoker -- 0.2 packs/day    Types: Cigarettes  . Smokeless tobacco: Never Used  . Alcohol Use: No    OB History    Grav Para Term Preterm Abortions TAB SAB Ect Mult Living                  Review of Systems  Constitutional: Negative for fever.  HENT: Positive for congestion and sore throat. Negative for neck pain.   Cardiovascular: Negative for chest pain.  Gastrointestinal: Positive for nausea and vomiting. Negative for abdominal pain and diarrhea.  Genitourinary: Negative for dysuria, hematuria, vaginal bleeding and vaginal  discharge.  Musculoskeletal: Negative for back pain.  Skin: Negative for rash.  Neurological: Negative for headaches.  Hematological: Does not bruise/bleed easily.    Allergies  Review of patient's allergies indicates no known allergies.  Home Medications   Current Outpatient Rx  Name  Route  Sig  Dispense  Refill  . ALBUTEROL SULFATE HFA 108 (90 BASE) MCG/ACT IN AERS   Inhalation   Inhale 1-2 puffs into the lungs every 6 (six) hours as needed for wheezing.   1 Inhaler   0   . PRENATAL VITAMINS 28-0.8 MG PO TABS   Oral   Take 1 tablet by mouth daily.   30 tablet   3   . PROMETHAZINE HCL 25 MG PO TABS   Oral   Take 1 tablet (25 mg total) by mouth every 6 (six) hours as needed for nausea.   20 tablet   0     BP 126/74  Pulse 58  Temp 98.3 F (36.8 C) (Oral)  Resp 18  SpO2 100%  LMP 09/29/2012  Physical Exam  Nursing note and vitals reviewed. Constitutional: She is oriented to person, place, and time. She appears well-developed and well-nourished.  HENT:  Head: Normocephalic and atraumatic.  Mouth/Throat: Oropharynx is clear and moist. No oropharyngeal exudate.  Eyes: Conjunctivae normal are normal. Pupils are equal, round, and reactive to light.  Neck: Normal range of motion. Neck supple.  Cardiovascular: Normal rate, regular rhythm and normal heart sounds.   No murmur heard. Pulmonary/Chest: Effort normal and breath sounds normal. No respiratory distress.  She has no wheezes. She has no rales.  Abdominal: Soft. Bowel sounds are normal. There is no tenderness. There is no rebound and no guarding.       The patient was subjective tenderness but no tenderness to palpation.  Musculoskeletal: Normal range of motion. She exhibits no edema.  Neurological: She is alert and oriented to person, place, and time. No cranial nerve deficit. She exhibits normal muscle tone. Coordination normal.  Skin: Skin is warm. No rash noted.    ED Course  Procedures (including  critical care time)  Labs Reviewed  URINALYSIS, ROUTINE W REFLEX MICROSCOPIC - Abnormal; Notable for the following:    APPearance CLOUDY (*)     All other components within normal limits  PREGNANCY, URINE - Abnormal; Notable for the following:    Preg Test, Ur POSITIVE (*)     All other components within normal limits  CBC WITH DIFFERENTIAL - Abnormal; Notable for the following:    WBC 11.6 (*)     Neutro Abs 8.0 (*)     Monocytes Absolute 1.3 (*)     All other components within normal limits  COMPREHENSIVE METABOLIC PANEL - Abnormal; Notable for the following:    Total Bilirubin 0.2 (*)     All other components within normal limits  HCG, QUANTITATIVE, PREGNANCY - Abnormal; Notable for the following:    hCG, Beta Chain, Quant, S 22326 (*)     All other components within normal limits  LIPASE, BLOOD  RAPID STREP SCREEN   US Ob Comp Less 14 Wks  11/29/2012  *RADIOLOGY REPORT*  Clinical Data: ABDOMINAL PAIN; ;  OBSTETRIC <14 WK [place] AND TRANSVAGINAL OB US  Technique: Both transabdominal and transvaginal ultrasound examinations were performed for complete evaluation of the gestation as well as the maternal uterus, adnexal regions, and pelvic cul-de-sac.  Comparison: None.  Findings: There is an intrauterine gestation.  By mean sac diameter, estimated gestational age is 6 weeks 6 days.  Very small embryo noted, with a fetal heart rate of 97 beats per minute.  Yolk sac is present.  Small second separate rounded structure which could represent a second yolk sac.  No subchorionic hemorrhage.  Right corpus luteum cyst.  No adnexal masses.  No free fluid.  IMPRESSION: 6-week-6-day intrauterine pregnancy.  Fetal heart rate 97 beats per minute.  Question second yolk sac.  Recommend follow-up ultrasound in 14 days.   Original Report Authenticated By: Charlett Nose, M.D.    US Ob Transvaginal  11/29/2012  *RADIOLOGY REPORT*  Clinical Data: ABDOMINAL PAIN; ;  OBSTETRIC <14 WK [place] AND TRANSVAGINAL OB US   Technique: Both transabdominal and transvaginal ultrasound examinations were performed for complete evaluation of the gestation as well as the maternal uterus, adnexal regions, and pelvic cul-de-sac.  Comparison: None.  Findings: There is an intrauterine gestation.  By mean sac diameter, estimated gestational age is 6 weeks 6 days.  Very small embryo noted, with a fetal heart rate of 97 beats per minute.  Yolk sac is present.  Small second separate rounded structure which could represent a second yolk sac.  No subchorionic hemorrhage.  Right corpus luteum cyst.  No adnexal masses.  No free fluid.  IMPRESSION: 6-week-6-day intrauterine pregnancy.  Fetal heart rate 97 beats per minute.  Question second yolk sac.  Recommend follow-up ultrasound in 14 days.   Original Report Authenticated By: Charlett Nose, M.D.      1. Threatened miscarriage in early pregnancy       MDM  Ultrasound confirms intrauterine pregnancy about 6 weeks fetal heart beat is present but it is on the low side around 92. Along with patient's abdominal pain no vaginal bleeding and a low heart rate it may represent impending miscarriage or threatened miscarriage. Patient informed of this. Patient with an upper respiratory infection rapid strep was negative for strep throat patient has been feeling as if she's been having some bronchospasm and some wheezing we'll treat with albuterol inhaler start her on prenatal vitamins and Phenergan for nausea. Patient is not febrile oxygen saturations are normal lungs are clear do not suspect pneumonia as part of the diagnosis.        Shelda Jakes, MD 11/29/12 [phone]

## 2012-11-29 NOTE — ED Notes (Signed)
Patient currently sitting up in bed; no respiratory or acute distress noted.  Patient updated on plan of care; informed patient that we are currently waiting on further orders from EDP; patient tearful at this time (EDP at bedside speaking with patient about test results).  Will continue to monitor.

## 2012-12-03 ENCOUNTER — Encounter (HOSPITAL_COMMUNITY): Payer: Self-pay | Admitting: *Deleted

## 2012-12-03 ENCOUNTER — Inpatient Hospital Stay (HOSPITAL_COMMUNITY)
Admission: AD | Admit: 2012-12-03 | Discharge: 2012-12-03 | Disposition: A | Discharge status: Home / Self Care | Payer: Medicaid Other | Source: Ambulatory Visit | Attending: Obstetrics and Gynecology | Admitting: Obstetrics and Gynecology

## 2012-12-03 DIAGNOSIS — Z3201 Encounter for pregnancy test, result positive: Secondary | ICD-10-CM

## 2012-12-03 DIAGNOSIS — O219 Vomiting of pregnancy, unspecified: Secondary | ICD-10-CM

## 2012-12-03 DIAGNOSIS — O210 Mild hyperemesis gravidarum: Secondary | ICD-10-CM | POA: Insufficient documentation

## 2012-12-03 HISTORY — DX: Umbilical hernia without obstruction or gangrene: K42.9

## 2012-12-03 HISTORY — DX: Unspecified asthma, uncomplicated: J45.909

## 2012-12-03 LAB — COMPREHENSIVE METABOLIC PANEL
AST: 17 U/L (ref 0–37)
Albumin: 4.1 g/dL (ref 3.5–5.2)
Alkaline Phosphatase: 61 U/L (ref 39–117)
BUN: 9 mg/dL (ref 6–23)
Creatinine, Ser: 0.49 mg/dL — ABNORMAL LOW (ref 0.50–1.10)
Potassium: 3.7 mEq/L (ref 3.5–5.1)
Total Protein: 7.8 g/dL (ref 6.0–8.3)

## 2012-12-03 LAB — URINE MICROSCOPIC-ADD ON

## 2012-12-03 LAB — URINALYSIS, ROUTINE W REFLEX MICROSCOPIC
Glucose, UA: NEGATIVE mg/dL
Hgb urine dipstick: NEGATIVE
Specific Gravity, Urine: 1.015 (ref 1.005–1.030)
Urobilinogen, UA: 0.2 mg/dL (ref 0.0–1.0)
pH: 7 (ref 5.0–8.0)

## 2012-12-03 LAB — CBC
HCT: 39.3 % (ref 36.0–46.0)
MCHC: 34.1 g/dL (ref 30.0–36.0)
Platelets: 255 10*3/uL (ref 150–400)
RDW: 13.3 % (ref 11.5–15.5)

## 2012-12-03 MED ORDER — ONDANSETRON HCL 4 MG PO TABS: 4.0000 mg | ORAL_TABLET | Freq: Three times a day (TID) | ORAL | Status: DC | PRN

## 2012-12-03 MED ORDER — RANITIDINE HCL 150 MG PO TABS: 150.0000 mg | ORAL_TABLET | Freq: Two times a day (BID) | ORAL | Status: DC

## 2012-12-03 MED ORDER — FAMOTIDINE IN NACL 20-0.9 MG/50ML-% IV SOLN
20.0000 mg | INTRAVENOUS | Status: AC
  Administered 2012-12-03: 20 mg via INTRAVENOUS
  Filled 2012-12-03: qty 50

## 2012-12-03 MED ORDER — PROMETHAZINE HCL 25 MG/ML IJ SOLN
25.0000 mg | Freq: Once | INTRAVENOUS | Status: AC
  Administered 2012-12-03: 25 mg via INTRAVENOUS
  Filled 2012-12-03: qty 1

## 2012-12-03 MED ORDER — ONDANSETRON HCL 4 MG/2ML IJ SOLN
4.0000 mg | INTRAMUSCULAR | Status: AC
  Administered 2012-12-03: 4 mg via INTRAVENOUS
  Filled 2012-12-03: qty 2

## 2012-12-03 MED ORDER — PROMETHAZINE HCL 25 MG RE SUPP: 25.0000 mg | Freq: Four times a day (QID) | RECTAL | Status: DC | PRN

## 2012-12-03 MED ORDER — LACTATED RINGERS IV BOLUS (SEPSIS)
1000.0000 mL | Freq: Once | INTRAVENOUS | Status: AC
  Administered 2012-12-03: 1000 mL via INTRAVENOUS

## 2012-12-03 NOTE — MAU Provider Note (Signed)
Chief Complaint: Hyperemesis Gravidarum   First Provider Initiated Contact with Patient 12/03/12 0455     SUBJECTIVE HPI: Caitlin Sullivan is a 21 y.o. G1P0 at [redacted]w[redacted]d by LMP who presents to maternity admissions reporting n/v several times daily and she has not kept down any food or much fluid for 3 days.  She feels weak and dizzy when standing and is having abdominal cramping today.  She denies vaginal bleeding, vaginal itching/burning, urinary symptoms, h/a, or fever/chills.     Past Medical History  Diagnosis Date  . Asthma   . Umbilical hernia    Past Surgical History  Procedure Date  . No past surgeries    History   Social History  . Marital Status: Single    Spouse Name: N/A    Number of Children: N/A  . Years of Education: N/A   Occupational History  . Not on file.   Social History Main Topics  . Smoking status: Former Smoker -- 0.2 packs/day    Types: Cigarettes  . Smokeless tobacco: Never Used  . Alcohol Use: No  . Drug Use: No  . Sexually Active: Yes    Birth Control/ Protection: None   Other Topics Concern  . Not on file   Social History Narrative  . No narrative on file   No current facility-administered medications on file prior to encounter.   Current Outpatient Prescriptions on File Prior to Encounter  Medication Sig Dispense Refill  . albuterol (PROVENTIL HFA;VENTOLIN HFA) 108 (90 BASE) MCG/ACT inhaler Inhale 1-2 puffs into the lungs every 6 (six) hours as needed for wheezing.  1 Inhaler  0  . promethazine (PHENERGAN) 25 MG tablet Take 1 tablet (25 mg total) by mouth every 6 (six) hours as needed for nausea.  20 tablet  0  . Prenatal Vit-Fe Fumarate-FA (PRENATAL VITAMINS) 28-0.8 MG TABS Take 1 tablet by mouth daily.  30 tablet  3   No Known Allergies  ROS: Pertinent items in HPI  OBJECTIVE Blood pressure 130/65, pulse 72, temperature 97.8 F (36.6 C), temperature source Oral, resp. rate 20, height 5\' 6"  (1.676 m), weight 77.111 kg (170 lb), last  menstrual period 09/29/2012, SpO2 100.00%. GENERAL: Well-developed, well-nourished female in no acute distress.  HEENT: Normocephalic HEART: normal rate RESP: normal effort ABDOMEN: Soft, non-tender EXTREMITIES: Nontender, no edema NEURO: Alert and oriented SPECULUM EXAM: Deferred  LAB RESULTS Results for orders placed during the hospital encounter of 12/03/12 (from the past 24 hour(s))  URINALYSIS, ROUTINE W REFLEX MICROSCOPIC     Status: Abnormal   Collection Time   12/03/12  4:05 AM      Component Value Range   Color, Urine YELLOW  YELLOW   APPearance CLEAR  CLEAR   Specific Gravity, Urine 1.015  1.005 - 1.030   pH 7.0  5.0 - 8.0   Glucose, UA NEGATIVE  NEGATIVE mg/dL   Hgb urine dipstick NEGATIVE  NEGATIVE   Bilirubin Urine SMALL (*) NEGATIVE   Ketones, ur >80 (*) NEGATIVE mg/dL   Protein, ur 161 (*) NEGATIVE mg/dL   Urobilinogen, UA 0.2  0.0 - 1.0 mg/dL   Nitrite NEGATIVE  NEGATIVE   Leukocytes, UA NEGATIVE  NEGATIVE  URINE MICROSCOPIC-ADD ON     Status: Abnormal   Collection Time   12/03/12  4:05 AM      Component Value Range   Squamous Epithelial / LPF FEW (*) RARE   WBC, UA 0-2  <3 WBC/hpf   RBC / HPF 0-2  <3 RBC/hpf  Bacteria, UA FEW (*) RARE   Urine-Other RARE YEAST      IMAGING Done at [facility] on 11/29/12:  US Ob Comp Less 14 Wks  11/29/2012  *RADIOLOGY REPORT*  Clinical Data: ABDOMINAL PAIN; ;  OBSTETRIC <14 WK [place] AND TRANSVAGINAL OB US  Technique: Both transabdominal and transvaginal ultrasound examinations were performed for complete evaluation of the gestation as well as the maternal uterus, adnexal regions, and pelvic cul-de-sac.  Comparison: None.  Findings: There is an intrauterine gestation.  By mean sac diameter, estimated gestational age is 6 weeks 6 days.  Very small embryo noted, with a fetal heart rate of 97 beats per minute.  Yolk sac is present.  Small second separate rounded structure which could represent a second yolk sac.  No subchorionic  hemorrhage.  Right corpus luteum cyst.  No adnexal masses.  No free fluid.  IMPRESSION: 6-week-6-day intrauterine pregnancy.  Fetal heart rate 97 beats per minute.  Question second yolk sac.  Recommend follow-up ultrasound in 14 days.   Original Report Authenticated By: Charlett Nose, M.D.    US Ob Transvaginal  11/29/2012  *RADIOLOGY REPORT*  Clinical Data: ABDOMINAL PAIN; ;  OBSTETRIC <14 WK [place] AND TRANSVAGINAL OB US  Technique: Both transabdominal and transvaginal ultrasound examinations were performed for complete evaluation of the gestation as well as the maternal uterus, adnexal regions, and pelvic cul-de-sac.  Comparison: None.  Findings: There is an intrauterine gestation.  By mean sac diameter, estimated gestational age is 6 weeks 6 days.  Very small embryo noted, with a fetal heart rate of 97 beats per minute.  Yolk sac is present.  Small second separate rounded structure which could represent a second yolk sac.  No subchorionic hemorrhage.  Right corpus luteum cyst.  No adnexal masses.  No free fluid.  IMPRESSION: 6-week-6-day intrauterine pregnancy.  Fetal heart rate 97 beats per minute.  Question second yolk sac.  Recommend follow-up ultrasound in 14 days.   Original Report Authenticated By: Charlett Nose, M.D.     ASSESSMENT 1. Positive blood pregnancy test   2. Nausea/vomiting in pregnancy     PLAN D5LR with Phenergan 25 mg IV x1000 ml, Zofran 4 mg IV, Pepcid 20 mg IV in MAU Second bag of fluid, LR x1000 ml--Total relief of symptoms with treatment in MAU  Discharge home Resume PO Phenergan Zofran 4-8 mg PO Q8 hours Zantac 150 mg PO BID Phenergan 25 mg rectal suppository as needed Drink plenty of fluids Transvaginal outpatient U/S scheduled in 1 week to f/u for viability (previous U/S with low FHR but early gestation) Keep scheduled appointment in 2 weeks in [facility] clinic Return to MAU as needed   Sharen Counter Certified Nurse-Midwife 12/03/2012  4:56 AM

## 2012-12-03 NOTE — MAU Note (Signed)
Pt presents with complaint of continued vomiting, states she is unable to keep anything down, states she feels weak.

## 2012-12-09 ENCOUNTER — Inpatient Hospital Stay (HOSPITAL_COMMUNITY)
Admission: AD | Admit: 2012-12-09 | Discharge: 2012-12-09 | Disposition: A | Discharge status: Home / Self Care | Payer: Medicaid Other | Source: Ambulatory Visit | Attending: Obstetrics & Gynecology | Admitting: Obstetrics & Gynecology

## 2012-12-09 ENCOUNTER — Encounter (HOSPITAL_COMMUNITY): Payer: Self-pay

## 2012-12-09 DIAGNOSIS — O219 Vomiting of pregnancy, unspecified: Secondary | ICD-10-CM

## 2012-12-09 DIAGNOSIS — O210 Mild hyperemesis gravidarum: Secondary | ICD-10-CM | POA: Insufficient documentation

## 2012-12-09 DIAGNOSIS — Z3490 Encounter for supervision of normal pregnancy, unspecified, unspecified trimester: Secondary | ICD-10-CM

## 2012-12-09 LAB — URINALYSIS, ROUTINE W REFLEX MICROSCOPIC
Glucose, UA: NEGATIVE mg/dL
Leukocytes, UA: NEGATIVE
pH: 8 (ref 5.0–8.0)

## 2012-12-09 MED ORDER — ONDANSETRON HCL 4 MG/2ML IJ SOLN
4.0000 mg | Freq: Once | INTRAMUSCULAR | Status: AC
  Administered 2012-12-09: 4 mg via INTRAVENOUS
  Filled 2012-12-09: qty 2

## 2012-12-09 MED ORDER — FAMOTIDINE IN NACL 20-0.9 MG/50ML-% IV SOLN
20.0000 mg | Freq: Once | INTRAVENOUS | Status: AC
  Administered 2012-12-09: 20 mg via INTRAVENOUS
  Filled 2012-12-09: qty 50

## 2012-12-09 MED ORDER — LACTATED RINGERS IV BOLUS (SEPSIS)
1000.0000 mL | Freq: Once | INTRAVENOUS | Status: AC
  Administered 2012-12-09: 1000 mL via INTRAVENOUS

## 2012-12-09 MED ORDER — ONDANSETRON 4 MG PO TBDP: 4.0000 mg | ORAL_TABLET | Freq: Four times a day (QID) | ORAL | Status: DC | PRN

## 2012-12-09 NOTE — MAU Provider Note (Signed)
Chief Complaint: Hyperemesis Gravidarum   First Provider Initiated Contact with Patient 12/09/12 1441     SUBJECTIVE HPI: Caitlin Sullivan is a 21 y.o. G2P0010 at [redacted]w[redacted]d  who presents for the third time this pregnancy for intractable vomiting. Tried Phenergan this am but vomited the pill. Last retained any fluids 1 days ago. Also has Zofran and Zantac but has not taken either. Has epigastric burning pain. CBC and CMP were WNL 12/03/12.  Past Medical History  Diagnosis Date  . Asthma   . Umbilical hernia    OB History    Grav Para Term Preterm Abortions TAB SAB Ect Mult Living   2    1 1          # Outc Date GA Lbr Len/2nd Wgt Sex Del Anes PTL Lv   1 TAB            2 CUR              Past Surgical History  Procedure Date  . No past surgeries   . Dilation and curettage of uterus     at age 6  Hx umbilical "hernia" that she says was "drained" in [place]  History   Social History  . Marital Status: Single    Spouse Name: N/A    Number of Children: N/A  . Years of Education: N/A   Occupational History  . Not on file.   Social History Main Topics  . Smoking status: Former Smoker -- 0.2 packs/day    Types: Cigarettes  . Smokeless tobacco: Never Used  . Alcohol Use: No  . Drug Use: No  . Sexually Active: Yes    Birth Control/ Protection: None   Other Topics Concern  . Not on file   Social History Narrative  . No narrative on file   No current facility-administered medications on file prior to encounter.   Current Outpatient Prescriptions on File Prior to Encounter  Medication Sig Dispense Refill  . albuterol (PROVENTIL HFA;VENTOLIN HFA) 108 (90 BASE) MCG/ACT inhaler Inhale 1-2 puffs into the lungs every 6 (six) hours as needed for wheezing.  1 Inhaler  0  . guaiFENesin (ROBITUSSIN) 100 MG/5ML liquid Take 200 mg by mouth 3 (three) times daily as needed.      . ondansetron (ZOFRAN) 4 MG tablet Take 1-2 tablets (4-8 mg total) by mouth every 8 (eight) hours as needed for  nausea.  30 tablet  1  . Prenatal Vit-Fe Fumarate-FA (PRENATAL VITAMINS) 28-0.8 MG TABS Take 1 tablet by mouth daily.  30 tablet  3  . promethazine (PHENERGAN) 25 MG suppository Place 1 suppository (25 mg total) rectally every 6 (six) hours as needed for nausea.  12 each  0  . promethazine (PHENERGAN) 25 MG tablet Take 1 tablet (25 mg total) by mouth every 6 (six) hours as needed for nausea.  20 tablet  0  . ranitidine (ZANTAC) 150 MG tablet Take 1 tablet (150 mg total) by mouth 2 (two) times daily.  60 tablet  5   No Known Allergies  ROS: Pertinent items in HPI  OBJECTIVE Blood pressure 120/69, pulse 82, temperature 97.6 F (36.4 C), temperature source Oral, resp. rate 20, last menstrual period 09/29/2012. Weight 12/03/12: 170#, today: 179#  GENERAL: Well-developed, well-nourished female, vomited in MAU HEENT: Normocephalic HEART: normal rate RESP: normal effort ABDOMEN: Soft, epigastric tenderness localized, no hernia noted EXTREMITIES: Nontender, no edema NEURO: Alert and oriented  LAB RESULTS Results for orders placed during the hospital encounter  of 12/09/12 (from the past 24 hour(s))  URINALYSIS, ROUTINE W REFLEX MICROSCOPIC     Status: Abnormal   Collection Time   12/09/12  2:11 PM      Component Value Range   Color, Urine YELLOW  YELLOW   APPearance CLOUDY (*) CLEAR   Specific Gravity, Urine 1.015  1.005 - 1.030   pH 8.0  5.0 - 8.0   Glucose, UA NEGATIVE  NEGATIVE mg/dL   Hgb urine dipstick NEGATIVE  NEGATIVE   Bilirubin Urine NEGATIVE  NEGATIVE   Ketones, ur NEGATIVE  NEGATIVE mg/dL   Protein, ur NEGATIVE  NEGATIVE mg/dL   Urobilinogen, UA 0.2  0.0 - 1.0 mg/dL   Nitrite NEGATIVE  NEGATIVE   Leukocytes, UA NEGATIVE  NEGATIVE    IMAGING US Ob Comp Less 14 Wks  11/29/2012  *RADIOLOGY REPORT*  Clinical Data: ABDOMINAL PAIN; ;  OBSTETRIC <14 WK [place] AND TRANSVAGINAL OB US  Technique: Both transabdominal and transvaginal ultrasound examinations were performed for  complete evaluation of the gestation as well as the maternal uterus, adnexal regions, and pelvic cul-de-sac.  Comparison: None.  Findings: There is an intrauterine gestation.  By mean sac diameter, estimated gestational age is 6 weeks 6 days.  Very small embryo noted, with a fetal heart rate of 97 beats per minute.  Yolk sac is present.  Small second separate rounded structure which could represent a second yolk sac.  No subchorionic hemorrhage.  Right corpus luteum cyst.  No adnexal masses.  No free fluid.  IMPRESSION: 6-week-6-day intrauterine pregnancy.  Fetal heart rate 97 beats per minute.  Question second yolk sac.  Recommend follow-up ultrasound in 14 days.   Original Report Authenticated By: Charlett Nose, M.D.    US Ob Transvaginal  11/29/2012  *RADIOLOGY REPORT*  Clinical Data: ABDOMINAL PAIN; ;  OBSTETRIC <14 WK [place] AND TRANSVAGINAL OB US  Technique: Both transabdominal and transvaginal ultrasound examinations were performed for complete evaluation of the gestation as well as the maternal uterus, adnexal regions, and pelvic cul-de-sac.  Comparison: None.  Findings: There is an intrauterine gestation.  By mean sac diameter, estimated gestational age is 6 weeks 6 days.  Very small embryo noted, with a fetal heart rate of 97 beats per minute.  Yolk sac is present.  Small second separate rounded structure which could represent a second yolk sac.  No subchorionic hemorrhage.  Right corpus luteum cyst.  No adnexal masses.  No free fluid.  IMPRESSION: 6-week-6-day intrauterine pregnancy.  Fetal heart rate 97 beats per minute.  Question second yolk sac.  Recommend follow-up ultrasound in 14 days.   Original Report Authenticated By: Charlett Nose, M.D.     MAU COURSE IV LR bolus with Pepcid  20 mg and Zofran 4 mg > nausea much improved  ASSESSMENT 1. Viable pregnancy   2. Nausea and vomiting in pregnancy     PLAN Discharge home See AVS Follow-up Information    Schedule an appointment as soon as  possible for a visit with [facility] [place]. (Come for ultrasound 12/14/12 at 11:30am. If you need to reschedule call [phone]. See list of pregnancy providers below)    Contact information:   [address] [place] [address] [phone]       Has appt [facility] 12/17/12 to start [facility]     Medication List     As of 12/09/2012  3:47 PM    STOP taking these medications         guaiFENesin 100  MG/5ML liquid   Commonly known as: ROBITUSSIN      ondansetron 4 MG tablet   Commonly known as: ZOFRAN      TAKE these medications         albuterol 108 (90 BASE) MCG/ACT inhaler   Commonly known as: PROVENTIL HFA;VENTOLIN HFA   Inhale 1-2 puffs into the lungs every 6 (six) hours as needed for wheezing.      Prenatal Vitamins 28-0.8 MG Tabs   Take 1 tablet by mouth daily.      promethazine 25 MG tablet   Commonly known as: PHENERGAN   Take 1 tablet (25 mg total) by mouth every 6 (six) hours as needed for nausea.      promethazine 25 MG suppository   Commonly known as: PHENERGAN   Place 1 suppository (25 mg total) rectally every 6 (six) hours as needed for nausea.      ranitidine 150 MG tablet   Commonly known as: ZANTAC   Take 1 tablet (150 mg total) by mouth 2 (two) times daily.           Danae Orleans, CNM 12/09/2012  2:46 PM

## 2012-12-09 NOTE — MAU Note (Signed)
Patient is in with c/o n/v and no relief from phenergan tablets. She last took a dose at 8am, patient thinks that she vomited the pill shortly after. She is actively vomiting yellow emesis. She c/o intermittent umbilical soreness. Denies vaginal bleeding

## 2012-12-09 NOTE — MAU Note (Signed)
Pt states was seen in MAU last week for hyperemesis, given rx for promethazine, last took at 800 this am, did not stay down. Actively vomiting in restroom and RN took pt to room, where she vomited again.

## 2012-12-14 ENCOUNTER — Ambulatory Visit (HOSPITAL_COMMUNITY)
Admit: 2012-12-14 | Discharge: 2012-12-14 | Disposition: A | Discharge status: Home / Self Care | Payer: Medicaid Other | Attending: Emergency Medicine | Admitting: Emergency Medicine

## 2012-12-14 ENCOUNTER — Encounter (HOSPITAL_COMMUNITY): Payer: Self-pay | Admitting: *Deleted

## 2012-12-14 ENCOUNTER — Inpatient Hospital Stay (HOSPITAL_COMMUNITY)
Admission: AD | Admit: 2012-12-14 | Discharge: 2012-12-14 | Disposition: A | Discharge status: Home / Self Care | Payer: Medicaid Other | Source: Ambulatory Visit | Attending: Obstetrics & Gynecology | Admitting: Obstetrics & Gynecology

## 2012-12-14 DIAGNOSIS — Z3201 Encounter for pregnancy test, result positive: Secondary | ICD-10-CM

## 2012-12-14 DIAGNOSIS — O99891 Other specified diseases and conditions complicating pregnancy: Secondary | ICD-10-CM | POA: Insufficient documentation

## 2012-12-14 DIAGNOSIS — O3680X0 Pregnancy with inconclusive fetal viability, not applicable or unspecified: Secondary | ICD-10-CM | POA: Insufficient documentation

## 2012-12-14 DIAGNOSIS — Z3689 Encounter for other specified antenatal screening: Secondary | ICD-10-CM | POA: Insufficient documentation

## 2012-12-14 DIAGNOSIS — Z3490 Encounter for supervision of normal pregnancy, unspecified, unspecified trimester: Secondary | ICD-10-CM

## 2012-12-14 NOTE — MAU Note (Signed)
Feeling much better, cramping continues.  Nausea continues.

## 2012-12-14 NOTE — MAU Provider Note (Signed)
  History     CSN: [id number]  Arrival date and time: 12/14/12 1246   First Provider Initiated Contact with Patient 12/14/12 1315      Chief Complaint  Patient presents with  . Follow-up   HPI [name] is a 21 year old G2 P0010 at 8w 2d who presents to MAU today for a follow-up US. The patient denies abdominal pain, vaginal bleeding or discharge at this time. She already has an appointment to start prenatal care in the clinic later this week. She is taking prenatal vitamins.   OB History    Grav Para Term Preterm Abortions TAB SAB Ect Mult Living   2    1 1           Past Medical History  Diagnosis Date  . Asthma   . Umbilical hernia     Past Surgical History  Procedure Date  . No past surgeries   . Dilation and curettage of uterus     at age 62    No family history on file.  History  Substance Use Topics  . Smoking status: Former Smoker -- 0.2 packs/day    Types: Cigarettes  . Smokeless tobacco: Never Used  . Alcohol Use: No    Allergies: No Known Allergies  Prescriptions prior to admission  Medication Sig Dispense Refill  . albuterol (PROVENTIL HFA;VENTOLIN HFA) 108 (90 BASE) MCG/ACT inhaler Inhale 1-2 puffs into the lungs every 6 (six) hours as needed for wheezing.  1 Inhaler  0  . ondansetron (ZOFRAN ODT) 4 MG disintegrating tablet Take 1 tablet (4 mg total) by mouth every 6 (six) hours as needed for nausea.  20 tablet  0  . Prenatal Vit-Fe Fumarate-FA (PRENATAL VITAMINS) 28-0.8 MG TABS Take 1 tablet by mouth daily.  30 tablet  3  . promethazine (PHENERGAN) 25 MG suppository Place 1 suppository (25 mg total) rectally every 6 (six) hours as needed for nausea.  12 each  0  . promethazine (PHENERGAN) 25 MG tablet Take 1 tablet (25 mg total) by mouth every 6 (six) hours as needed for nausea.  20 tablet  0  . ranitidine (ZANTAC) 150 MG tablet Take 1 tablet (150 mg total) by mouth 2 (two) times daily.  60 tablet  5    ROS All negative unless otherwise  noted in HPI Physical Exam   Blood pressure 125/73, pulse 74, temperature 97.8 F (36.6 C), temperature source Oral, resp. rate 18, last menstrual period 09/29/2012.  Physical Exam  Constitutional: She is oriented to person, place, and time. She appears well-developed and well-nourished. No distress.  Cardiovascular: Normal rate.   Respiratory: Effort normal.  Neurological: She is alert and oriented to person, place, and time.  Psychiatric: She has a normal mood and affect.     MAU Course  Procedures None   Assessment and Plan  A: Pregnant  P: Discharge home Patient has appointment to start prenatal care [facility] later this week Patient encouraged to continue taking prenatal vitamins Pregnancy confirmation letter given to start Medicaid process Patient cautioned to return to MAU if she were to develop abnormal bleeding or severe abdominal pain.    Freddi Starr, PA-C 12/14/2012, 1:40 PM

## 2012-12-16 NOTE — MAU Provider Note (Signed)
Attestation of Attending Supervision of Advanced Practitioner: Evaluation and management procedures were performed by the PA/NP/CNM/OB Fellow under my supervision/collaboration. Chart reviewed and agree with management and plan.  Julian Medina V 12/16/2012 11:18 PM

## 2012-12-17 ENCOUNTER — Encounter: Payer: Self-pay | Admitting: Family Medicine

## 2012-12-21 ENCOUNTER — Emergency Department (HOSPITAL_COMMUNITY)
Admission: EM | Admit: 2012-12-21 | Discharge: 2012-12-22 | Disposition: A | Discharge status: Home / Self Care | Payer: Medicaid Other | Attending: Emergency Medicine | Admitting: Emergency Medicine

## 2012-12-21 ENCOUNTER — Encounter (HOSPITAL_COMMUNITY): Payer: Self-pay | Admitting: *Deleted

## 2012-12-21 DIAGNOSIS — O9989 Other specified diseases and conditions complicating pregnancy, childbirth and the puerperium: Secondary | ICD-10-CM | POA: Insufficient documentation

## 2012-12-21 DIAGNOSIS — Z87891 Personal history of nicotine dependence: Secondary | ICD-10-CM | POA: Insufficient documentation

## 2012-12-21 DIAGNOSIS — Z3201 Encounter for pregnancy test, result positive: Secondary | ICD-10-CM | POA: Insufficient documentation

## 2012-12-21 DIAGNOSIS — O26899 Other specified pregnancy related conditions, unspecified trimester: Secondary | ICD-10-CM

## 2012-12-21 DIAGNOSIS — Z79899 Other long term (current) drug therapy: Secondary | ICD-10-CM | POA: Insufficient documentation

## 2012-12-21 DIAGNOSIS — R112 Nausea with vomiting, unspecified: Secondary | ICD-10-CM | POA: Insufficient documentation

## 2012-12-21 DIAGNOSIS — J45909 Unspecified asthma, uncomplicated: Secondary | ICD-10-CM | POA: Insufficient documentation

## 2012-12-21 DIAGNOSIS — Z9889 Other specified postprocedural states: Secondary | ICD-10-CM | POA: Insufficient documentation

## 2012-12-21 DIAGNOSIS — O219 Vomiting of pregnancy, unspecified: Secondary | ICD-10-CM

## 2012-12-21 DIAGNOSIS — R1032 Left lower quadrant pain: Secondary | ICD-10-CM | POA: Insufficient documentation

## 2012-12-21 DIAGNOSIS — Z8719 Personal history of other diseases of the digestive system: Secondary | ICD-10-CM | POA: Insufficient documentation

## 2012-12-21 NOTE — ED Notes (Signed)
Pt states she is [redacted] wks pregnant; started "gushing" blood 5 days ago; heavy clots x 1 days then like a period for two more days; has continued with severe cramping and nausea/vomiting since then; hurts to walk; no fever;

## 2012-12-22 ENCOUNTER — Emergency Department (HOSPITAL_COMMUNITY): Payer: Medicaid Other

## 2012-12-22 LAB — CBC WITH DIFFERENTIAL/PLATELET
Basophils Absolute: 0 10*3/uL (ref 0.0–0.1)
HCT: 37.3 % (ref 36.0–46.0)
Hemoglobin: 12.9 g/dL (ref 12.0–15.0)
Lymphocytes Relative: 23 % (ref 12–46)
Lymphs Abs: 2.5 10*3/uL (ref 0.7–4.0)
Monocytes Absolute: 0.7 10*3/uL (ref 0.1–1.0)
Monocytes Relative: 7 % (ref 3–12)
Neutro Abs: 7.4 10*3/uL (ref 1.7–7.7)
RBC: 4.33 MIL/uL (ref 3.87–5.11)
WBC: 10.7 10*3/uL — ABNORMAL HIGH (ref 4.0–10.5)

## 2012-12-22 LAB — URINALYSIS, ROUTINE W REFLEX MICROSCOPIC
Hgb urine dipstick: NEGATIVE
Protein, ur: NEGATIVE mg/dL
Urobilinogen, UA: 0.2 mg/dL (ref 0.0–1.0)

## 2012-12-22 LAB — COMPREHENSIVE METABOLIC PANEL
AST: 16 U/L (ref 0–37)
BUN: 7 mg/dL (ref 6–23)
CO2: 23 mEq/L (ref 19–32)
Chloride: 102 mEq/L (ref 96–112)
Creatinine, Ser: 0.44 mg/dL — ABNORMAL LOW (ref 0.50–1.10)
GFR calc non Af Amer: >90 mL/min (ref >90)
Total Bilirubin: 0.2 mg/dL — ABNORMAL LOW (ref 0.3–1.2)

## 2012-12-22 LAB — HCG, QUANTITATIVE, PREGNANCY: hCG, Beta Chain, Quant, S: 139114 m[IU]/mL — ABNORMAL HIGH (ref <5)

## 2012-12-22 LAB — WET PREP, GENITAL
Trich, Wet Prep: NONE SEEN
Yeast Wet Prep HPF POC: NONE SEEN

## 2012-12-22 MED ORDER — ACETAMINOPHEN 325 MG PO TABS
650.0000 mg | ORAL_TABLET | Freq: Once | ORAL | Status: AC
  Administered 2012-12-22: 650 mg via ORAL
  Filled 2012-12-22: qty 2

## 2012-12-22 MED ORDER — ONDANSETRON HCL 4 MG/2ML IJ SOLN
4.0000 mg | Freq: Once | INTRAMUSCULAR | Status: AC
  Administered 2012-12-22: 4 mg via INTRAVENOUS
  Filled 2012-12-22: qty 2

## 2012-12-22 MED ORDER — SODIUM CHLORIDE 0.9 % IV BOLUS (SEPSIS)
1000.0000 mL | Freq: Once | INTRAVENOUS | Status: AC
  Administered 2012-12-22: 1000 mL via INTRAVENOUS

## 2012-12-22 MED ORDER — ONDANSETRON HCL 4 MG PO TABS: 4.0000 mg | ORAL_TABLET | Freq: Four times a day (QID) | ORAL | Status: DC

## 2012-12-22 NOTE — ED Notes (Signed)
MD at bedside. 

## 2012-12-23 ENCOUNTER — Observation Stay (HOSPITAL_COMMUNITY)
Admission: AD | Admit: 2012-12-23 | Discharge: 2012-12-24 | Disposition: A | Discharge status: Home / Self Care | Payer: Medicaid Other | Source: Ambulatory Visit | Attending: Obstetrics & Gynecology | Admitting: Obstetrics & Gynecology

## 2012-12-23 ENCOUNTER — Encounter (HOSPITAL_COMMUNITY): Payer: Self-pay | Admitting: *Deleted

## 2012-12-23 DIAGNOSIS — E876 Hypokalemia: Secondary | ICD-10-CM | POA: Insufficient documentation

## 2012-12-23 DIAGNOSIS — O9989 Other specified diseases and conditions complicating pregnancy, childbirth and the puerperium: Secondary | ICD-10-CM | POA: Insufficient documentation

## 2012-12-23 DIAGNOSIS — K117 Disturbances of salivary secretion: Secondary | ICD-10-CM | POA: Insufficient documentation

## 2012-12-23 DIAGNOSIS — E860 Dehydration: Secondary | ICD-10-CM | POA: Insufficient documentation

## 2012-12-23 DIAGNOSIS — O210 Mild hyperemesis gravidarum: Secondary | ICD-10-CM

## 2012-12-23 DIAGNOSIS — O211 Hyperemesis gravidarum with metabolic disturbance: Principal | ICD-10-CM | POA: Insufficient documentation

## 2012-12-23 LAB — CBC WITH DIFFERENTIAL/PLATELET
Eosinophils Absolute: 0 10*3/uL (ref 0.0–0.7)
Eosinophils Relative: 0 % (ref 0–5)
Lymphs Abs: 1.7 10*3/uL (ref 0.7–4.0)
MCH: 29.6 pg (ref 26.0–34.0)
MCV: 85.5 fL (ref 78.0–100.0)
Platelets: 259 10*3/uL (ref 150–400)
RBC: 4.29 MIL/uL (ref 3.87–5.11)
RDW: 12.8 % (ref 11.5–15.5)

## 2012-12-23 LAB — URINALYSIS, ROUTINE W REFLEX MICROSCOPIC
Bilirubin Urine: NEGATIVE
Hgb urine dipstick: NEGATIVE
Ketones, ur: >80 mg/dL — AB
Protein, ur: NEGATIVE mg/dL
Urobilinogen, UA: 1 mg/dL (ref 0.0–1.0)

## 2012-12-23 LAB — BASIC METABOLIC PANEL
BUN: 5 mg/dL — ABNORMAL LOW (ref 6–23)
Calcium: 9.6 mg/dL (ref 8.4–10.5)
GFR calc non Af Amer: >90 mL/min (ref >90)
Glucose, Bld: 90 mg/dL (ref 70–99)
Sodium: 132 mEq/L — ABNORMAL LOW (ref 135–145)

## 2012-12-23 MED ORDER — SODIUM CHLORIDE 0.9 % IV SOLN
INTRAVENOUS | Status: AC
  Administered 2012-12-23: 08:00:00 via INTRAVENOUS

## 2012-12-23 MED ORDER — ALBUTEROL SULFATE HFA 108 (90 BASE) MCG/ACT IN AERS
1.0000 | INHALATION_SPRAY | Freq: Four times a day (QID) | RESPIRATORY_TRACT | Status: DC | PRN
  Filled 2012-12-23: qty 6.7

## 2012-12-23 MED ORDER — ONDANSETRON HCL 4 MG PO TABS: 8.0000 mg | ORAL_TABLET | Freq: Four times a day (QID) | ORAL | Status: DC | PRN

## 2012-12-23 MED ORDER — ONDANSETRON HCL 4 MG/2ML IJ SOLN
4.0000 mg | Freq: Three times a day (TID) | INTRAMUSCULAR | Status: AC | PRN
  Filled 2012-12-23: qty 2

## 2012-12-23 MED ORDER — ONDANSETRON 8 MG/NS 50 ML IVPB
8.0000 mg | Freq: Four times a day (QID) | INTRAVENOUS | Status: DC | PRN
  Administered 2012-12-23 – 2012-12-24 (×3): 8 mg via INTRAVENOUS
  Filled 2012-12-23 (×3): qty 8

## 2012-12-23 MED ORDER — ONDANSETRON HCL 4 MG/2ML IJ SOLN
4.0000 mg | Freq: Once | INTRAMUSCULAR | Status: AC
  Administered 2012-12-23: 4 mg via INTRAVENOUS
  Filled 2012-12-23: qty 2

## 2012-12-23 MED ORDER — GLYCOPYRROLATE 1 MG PO TABS
2.0000 mg | ORAL_TABLET | Freq: Three times a day (TID) | ORAL | Status: DC
  Administered 2012-12-23 – 2012-12-24 (×6): 2 mg via ORAL
  Filled 2012-12-23 (×7): qty 2

## 2012-12-23 MED ORDER — ACETAMINOPHEN 325 MG PO TABS
650.0000 mg | ORAL_TABLET | Freq: Four times a day (QID) | ORAL | Status: DC | PRN
  Filled 2012-12-23: qty 2

## 2012-12-23 MED ORDER — SODIUM CHLORIDE 0.9 % IV BOLUS (SEPSIS)
1000.0000 mL | Freq: Once | INTRAVENOUS | Status: AC
  Administered 2012-12-23: 1000 mL via INTRAVENOUS

## 2012-12-23 MED ORDER — PANTOPRAZOLE SODIUM 40 MG IV SOLR
40.0000 mg | Freq: Two times a day (BID) | INTRAVENOUS | Status: DC
  Administered 2012-12-23 – 2012-12-24 (×4): 40 mg via INTRAVENOUS
  Filled 2012-12-23 (×5): qty 40

## 2012-12-23 MED ORDER — ACETAMINOPHEN 650 MG RE SUPP: 650.0000 mg | Freq: Four times a day (QID) | RECTAL | Status: DC | PRN

## 2012-12-23 MED ORDER — KCL IN DEXTROSE-NACL 40-5-0.45 MEQ/L-%-% IV SOLN
INTRAVENOUS | Status: DC
  Administered 2012-12-23 – 2012-12-24 (×3): via INTRAVENOUS
  Filled 2012-12-23 (×7): qty 1000

## 2012-12-23 MED ORDER — POTASSIUM CHLORIDE 10 MEQ/100ML IV SOLN
10.0000 meq | Freq: Once | INTRAVENOUS | Status: AC
  Administered 2012-12-23: 10 meq via INTRAVENOUS
  Filled 2012-12-23: qty 100

## 2012-12-23 MED ORDER — METOCLOPRAMIDE HCL 5 MG/ML IJ SOLN
10.0000 mg | Freq: Three times a day (TID) | INTRAMUSCULAR | Status: DC
  Administered 2012-12-23 – 2012-12-24 (×5): 10 mg via INTRAVENOUS
  Filled 2012-12-23 (×5): qty 2

## 2012-12-23 MED ORDER — ZOLPIDEM TARTRATE 5 MG PO TABS
5.0000 mg | ORAL_TABLET | Freq: Every evening | ORAL | Status: DC | PRN
  Administered 2012-12-23: 5 mg via ORAL
  Filled 2012-12-23: qty 1

## 2012-12-23 NOTE — ED Provider Notes (Signed)
History     CSN: [id number]  Arrival date & time 12/23/12  0445   First MD Initiated Contact with Patient 12/23/12 [phone]      Chief Complaint  Patient presents with  . Emesis During Pregnancy    (Consider location/radiation/quality/duration/timing/severity/associated sxs/prior treatment) HPI Hx per PT. Is 9 weeks preg, G2P0, has had first [place] with IUO, no vag bleeding or discharge or LOF. No ABD pain, some intermittent cramping, no diarrhea. No F/C, no cough or sore throat. Was evaluated here last night fpr the same, improved and sent home with zofran, took zofran today with persistent emesis.no bloody or bilious emesis. Also took phenergan PTA with emesis. Mod in severity, no known alleviating factors.    Past Medical History  Diagnosis Date  . Asthma   . Umbilical hernia     Past Surgical History  Procedure Date  . No past surgeries   . Dilation and curettage of uterus     at age 35    History reviewed. No pertinent family history.  History  Substance Use Topics  . Smoking status: Former Smoker -- 0.2 packs/day    Types: Cigarettes  . Smokeless tobacco: Never Used  . Alcohol Use: No    OB History    Grav Para Term Preterm Abortions TAB SAB Ect Mult Living   2    1 1           Review of Systems  Constitutional: Negative for fever and chills.  HENT: Negative for neck pain and neck stiffness.   Eyes: Negative for pain.  Respiratory: Negative for shortness of breath.   Cardiovascular: Negative for chest pain.  Gastrointestinal: Positive for nausea, vomiting and abdominal pain. Negative for abdominal distention.  Genitourinary: Negative for dysuria.  Musculoskeletal: Negative for gait problem.  Skin: Negative for rash.  Neurological: Negative for headaches.  All other systems reviewed and are negative.    Allergies  Review of patient's allergies indicates no known allergies.  Home Medications   Current Outpatient Rx  Name  Route  Sig  Dispense  Refill  .  ALBUTEROL SULFATE HFA 108 (90 BASE) MCG/ACT IN AERS   Inhalation   Inhale 1-2 puffs into the lungs every 6 (six) hours as needed. For wheezing or shortness of breath         . FOLIC ACID 800 MCG PO TABS   Oral   Take 400 mcg by mouth every morning.         Marland Kitchen OMEPRAZOLE 20 MG PO CPDR   Oral   Take 20 mg by mouth every morning.         Marland Kitchen ONDANSETRON HCL 4 MG PO TABS   Oral   Take 4 mg by mouth every 6 (six) hours as needed. For nausea         . PRENATAL MULTIVITAMIN CH   Oral   Take 1 tablet by mouth every morning.          Marland Kitchen PROMETHAZINE HCL 25 MG RE SUPP   Rectal   Place 25 mg rectally every 6 (six) hours as needed. For nausea         . PROMETHAZINE HCL 25 MG PO TABS   Oral   Take 25 mg by mouth every 6 (six) hours as needed. For nausea           BP 115/74  Pulse 80  Temp 97.9 F (36.6 C) (Oral)  Resp 16  SpO2 97%  LMP 09/29/2012  Physical  Exam  Constitutional: She is oriented to person, place, and time. She appears well-developed and well-nourished.  HENT:  Head: Normocephalic and atraumatic.       Mildly dry mm  Eyes: EOM are normal. Pupils are equal, round, and reactive to light. No scleral icterus.  Neck: Normal range of motion. Neck supple.  Cardiovascular: Normal rate, regular rhythm and intact distal pulses.   Pulmonary/Chest: Effort normal and breath sounds normal. No respiratory distress.  Abdominal: Soft. Bowel sounds are normal. She exhibits no distension. There is no tenderness. There is no rebound and no guarding.  Musculoskeletal: Normal range of motion. She exhibits no edema.  Neurological: She is alert and oriented to person, place, and time.  Skin: Skin is warm and dry.    ED Course  Procedures (including critical care time)  Results for orders placed during the hospital encounter of 12/23/12  CBC WITH DIFFERENTIAL      Component Value Range   WBC 10.4  4.0 - 10.5 K/uL   RBC 4.29  3.87 - 5.11 MIL/uL   Hemoglobin 12.7  12.0 -  15.0 g/dL   HCT 16.1  09.6 - 04.5 %   MCV 85.5  78.0 - 100.0 fL   MCH 29.6  26.0 - 34.0 pg   MCHC 34.6  30.0 - 36.0 g/dL   RDW 40.9  81.1 - 91.4 %   Platelets 259  150 - 400 K/uL   Neutrophils Relative 77  43 - 77 %   Neutro Abs 8.0 (*) 1.7 - 7.7 K/uL   Lymphocytes Relative 17  12 - 46 %   Lymphs Abs 1.7  0.7 - 4.0 K/uL   Monocytes Relative 6  3 - 12 %   Monocytes Absolute 0.6  0.1 - 1.0 K/uL   Eosinophils Relative 0  0 - 5 %   Eosinophils Absolute 0.0  0.0 - 0.7 K/uL   Basophils Relative 0  0 - 1 %   Basophils Absolute 0.0  0.0 - 0.1 K/uL  BASIC METABOLIC PANEL      Component Value Range   Sodium 132 (*) 135 - 145 mEq/L   Potassium 3.2 (*) 3.5 - 5.1 mEq/L   Chloride 100  96 - 112 mEq/L   CO2 24  19 - 32 mEq/L   Glucose, Bld 90  70 - 99 mg/dL   BUN 5 (*) 6 - 23 mg/dL   Creatinine, Ser 7.82 (*) 0.50 - 1.10 mg/dL   Calcium 9.6  8.4 - 95.6 mg/dL   GFR calc non Af Amer >90  >90 mL/min   GFR calc Af Amer >90  >90 mL/min   US Ob Comp Less 14 Wks  12/22/2012  *RADIOLOGY REPORT*  Clinical Data: Pelvic pain and vaginal bleeding.  OBSTETRIC <14 WK [place] AND TRANSVAGINAL OB US  Technique:  Both transabdominal and transvaginal ultrasound examinations were performed for complete evaluation of the gestation as well as the maternal uterus, adnexal regions, and pelvic cul-de-sac.  Transvaginal technique was performed to assess early pregnancy.  Comparison:  Prior ultrasound of pregnancy performed 12/14/2012  Intrauterine gestational sac:  Visualized/normal in shape. Yolk sac: Yes Embryo: Yes Cardiac Activity: Yes Heart Rate: 168 bpm  CRL: 26  mm  9 w  3 d         [place] EDC: 07/24/2013  Maternal uterus/adnexae: The uterus is otherwise unremarkable in appearance.  No subchorionic hemorrhage is noted.  The ovaries are within normal limits.  The right ovary measures 3.2 x  2.9 x 2.2 cm, while the left ovary measures 3.2 x 1.5 x 2.0 cm. No suspicious adnexal masses are seen; there is no evidence for ovarian  torsion.  No free fluid is seen within the pelvic cul-de-sac.  IMPRESSION: Single live intrauterine pregnancy noted, with a crown-rump length of 2.6 cm, corresponding to a gestational age of [redacted] weeks 3 days. This matches the gestational age of [redacted] weeks 3 days by LMP, reflecting an estimated date of delivery of July 17, 2013.  The quantitative beta HCG level is unusually high; this is nonspecific.   Original Report Authenticated By: Tonia Ghent, M.D.    US Ob Comp Less 14 Wks  11/29/2012  *RADIOLOGY REPORT*  Clinical Data: ABDOMINAL PAIN; ;  OBSTETRIC <14 WK [place] AND TRANSVAGINAL OB US  Technique: Both transabdominal and transvaginal ultrasound examinations were performed for complete evaluation of the gestation as well as the maternal uterus, adnexal regions, and pelvic cul-de-sac.  Comparison: None.  Findings: There is an intrauterine gestation.  By mean sac diameter, estimated gestational age is 6 weeks 6 days.  Very small embryo noted, with a fetal heart rate of 97 beats per minute.  Yolk sac is present.  Small second separate rounded structure which could represent a second yolk sac.  No subchorionic hemorrhage.  Right corpus luteum cyst.  No adnexal masses.  No free fluid.  IMPRESSION: 6-week-6-day intrauterine pregnancy.  Fetal heart rate 97 beats per minute.  Question second yolk sac.  Recommend follow-up ultrasound in 14 days.   Original Report Authenticated By: Charlett Nose, M.D.    US Ob Transvaginal  12/22/2012  *RADIOLOGY REPORT*  Clinical Data: Pelvic pain and vaginal bleeding.  OBSTETRIC <14 WK [place] AND TRANSVAGINAL OB US  Technique:  Both transabdominal and transvaginal ultrasound examinations were performed for complete evaluation of the gestation as well as the maternal uterus, adnexal regions, and pelvic cul-de-sac.  Transvaginal technique was performed to assess early pregnancy.  Comparison:  Prior ultrasound of pregnancy performed 12/14/2012  Intrauterine gestational sac:  Visualized/normal in  shape. Yolk sac: Yes Embryo: Yes Cardiac Activity: Yes Heart Rate: 168 bpm  CRL: 26  mm  9 w  3 d         [place] EDC: 07/24/2013  Maternal uterus/adnexae: The uterus is otherwise unremarkable in appearance.  No subchorionic hemorrhage is noted.  The ovaries are within normal limits.  The right ovary measures 3.2 x 2.9 x 2.2 cm, while the left ovary measures 3.2 x 1.5 x 2.0 cm. No suspicious adnexal masses are seen; there is no evidence for ovarian torsion.  No free fluid is seen within the pelvic cul-de-sac.  IMPRESSION: Single live intrauterine pregnancy noted, with a crown-rump length of 2.6 cm, corresponding to a gestational age of [redacted] weeks 3 days. This matches the gestational age of [redacted] weeks 3 days by LMP, reflecting an estimated date of delivery of July 17, 2013.  The quantitative beta HCG level is unusually high; this is nonspecific.   Original Report Authenticated By: Tonia Ghent, M.D.    US Ob Transvaginal  12/14/2012  OBSTETRICAL ULTRASOUND: This exam was performed within a [place] [facility] Department. The OB US report was generated in the [facility] system, and faxed to the ordering physician.   This report is also available in [employer] and in the [employer]. See [facility] Obstetric US report.   US Ob Transvaginal  11/29/2012  *RADIOLOGY REPORT*  Clinical Data: ABDOMINAL PAIN; ;  OBSTETRIC <14 WK [place]  AND TRANSVAGINAL OB US  Technique: Both transabdominal and transvaginal ultrasound examinations were performed for complete evaluation of the gestation as well as the maternal uterus, adnexal regions, and pelvic cul-de-sac.  Comparison: None.  Findings: There is an intrauterine gestation.  By mean sac diameter, estimated gestational age is 6 weeks 6 days.  Very small embryo noted, with a fetal heart rate of 97 beats per minute.  Yolk sac is present.  Small second separate rounded structure which could represent a second yolk sac.  No subchorionic hemorrhage.  Right corpus luteum cyst.  No  adnexal masses.  No free fluid.  IMPRESSION: 6-week-6-day intrauterine pregnancy.  Fetal heart rate 97 beats per minute.  Question second yolk sac.  Recommend follow-up ultrasound in 14 days.   Original Report Authenticated By: Charlett Nose, M.D.     Hyperemesis gravidarum  IVFs. Zofran. labs MDM   Persistent vomiting despite fluids and antiemetics. Labs reviewed. Potassium provided.  Discussed with OB/GYN on-call Dr. Jearld Lesch, plan transfer to [facility] hospital  Vital signs / nursing notes reviewed        Sunnie Nielsen, MD 12/23/12 [phone]

## 2012-12-23 NOTE — L&D Delivery Note (Signed)
Delivery Note At 7:53 PM a viable and healthy female was delivered via Vaginal, Spontaneous Delivery (Presentation: ; Occiput Anterior).  APGAR: 9, 10; weight 9 lb 11.4 oz (4406 g).   Placenta status: Intact, Spontaneous.  Cord: 3 vessels with the following complications: None.    Anesthesia: Epidural  Episiotomy: None Lacerations: 2nd degree;Perineal Suture Repair: 3.0 vicryl Est. Blood Loss (mL): 300  Mom to postpartum.  Baby to nursery-stable.  Vale Haven, MD 07/19/13 2100

## 2012-12-23 NOTE — ED Notes (Signed)
Pt being followed at [facility] center for pregnancy.

## 2012-12-23 NOTE — ED Notes (Signed)
Pt made aware [facility] and will [facility] soon

## 2012-12-23 NOTE — ED Notes (Signed)
Pt c/o nausea and vomiting starting last night at 2030. Pt states the rx'd medications do not work to control her nausea and vomiting.

## 2012-12-23 NOTE — ED Provider Notes (Signed)
History     CSN: [id number]  Arrival date & time 12/21/12  2300   First MD Initiated Contact with Patient 12/22/12 0024      Chief Complaint  Patient presents with  . Vaginal Bleeding    (Consider location/radiation/quality/duration/timing/severity/associated sxs/prior treatment) HPI 22 year old female presents to the emergency department, G1 P0 at approximately [redacted] weeks pregnant. Patient is complaining of left lower abdominal pain mainly when walking. She reports 5 days ago she had heavy bleeding followed by clots for several days. No bleeding for the last 2-3 days. Patient had ultrasound done in the emergency department 2 weeks ago for threatened AB, noted to have 2 gestational sacs. She had a second ultrasound done at [facility] hospital that was reported to have a normal live fetus in the second sac no longer visualized. She denies any fever or chills. She's been taking Phenergan but is still having vomiting. She has followup at the [facility] hospital clinic in a few weeks Past Medical History  Diagnosis Date  . Asthma   . Umbilical hernia     Past Surgical History  Procedure Date  . No past surgeries   . Dilation and curettage of uterus     at age 54    No family history on file.  History  Substance Use Topics  . Smoking status: Former Smoker -- 0.2 packs/day    Types: Cigarettes  . Smokeless tobacco: Never Used  . Alcohol Use: No    OB History    Grav Para Term Preterm Abortions TAB SAB Ect Mult Living   2    1 1           Review of Systems  All other systems reviewed and are negative.    Allergies  Review of patient's allergies indicates no known allergies.  Home Medications   Current Outpatient Rx  Name  Route  Sig  Dispense  Refill  . ALBUTEROL SULFATE HFA 108 (90 BASE) MCG/ACT IN AERS   Inhalation   Inhale 1-2 puffs into the lungs every 6 (six) hours as needed. For wheezing or shortness of breath         . FOLIC ACID 800 MCG PO TABS   Oral   Take  400 mcg by mouth every morning.         Marland Kitchen OMEPRAZOLE 20 MG PO CPDR   Oral   Take 20 mg by mouth every morning.         Marland Kitchen PRENATAL MULTIVITAMIN CH   Oral   Take 1 tablet by mouth every morning.          Marland Kitchen PROMETHAZINE HCL 25 MG RE SUPP   Rectal   Place 25 mg rectally every 6 (six) hours as needed. For nausea         . PROMETHAZINE HCL 25 MG PO TABS   Oral   Take 25 mg by mouth every 6 (six) hours as needed. For nausea         . ONDANSETRON HCL 4 MG PO TABS   Oral   Take 4 mg by mouth every 6 (six) hours as needed. For nausea           BP 111/74  Pulse 66  Temp 97.8 F (36.6 C) (Oral)  Resp 20  Ht 5\' 6"  (1.676 m)  Wt 180 lb (81.647 kg)  BMI 29.05 kg/m2  SpO2 100%  LMP 09/29/2012  Physical Exam  Nursing note and vitals reviewed. Constitutional: She is oriented to person,  place, and time. She appears well-developed and well-nourished.  HENT:  Head: Normocephalic and atraumatic.  Right Ear: External ear normal.  Left Ear: External ear normal.  Nose: Nose normal.  Mouth/Throat: Oropharynx is clear and moist.  Eyes: Conjunctivae normal and EOM are normal. Pupils are equal, round, and reactive to light.  Neck: Normal range of motion. Neck supple. No JVD present. No tracheal deviation present. No thyromegaly present.  Cardiovascular: Normal rate, regular rhythm, normal heart sounds and intact distal pulses.  Exam reveals no gallop and no friction rub.   No murmur heard. Pulmonary/Chest: Effort normal and breath sounds normal. No stridor. No respiratory distress. She has no wheezes. She has no rales. She exhibits no tenderness.  Abdominal: Soft. Bowel sounds are normal. She exhibits no distension and no mass. There is tenderness (tenderness to palpation across lower abdomen diffusely.). There is no rebound and no guarding.  Genitourinary: Vaginal discharge (scant white discharge noted.) found.       No cervical motion tenderness. Patient with pain with palpation  in left adnexa, mild suprapubic and uterine tenderness. Os is closed no blood noted  Musculoskeletal: Normal range of motion. She exhibits no edema and no tenderness.  Lymphadenopathy:    She has no cervical adenopathy.  Neurological: She is alert and oriented to person, place, and time. She has normal reflexes. No cranial nerve deficit. She exhibits normal muscle tone. Coordination normal.  Skin: Skin is warm and dry. No rash noted. No erythema. No pallor.  Psychiatric: She has a normal mood and affect. Her behavior is normal. Judgment and thought content normal.    ED Course  Procedures (including critical care time)  Labs Reviewed  CBC WITH DIFFERENTIAL - Abnormal; Notable for the following:    WBC 10.7 (*)     All other components within normal limits  COMPREHENSIVE METABOLIC PANEL - Abnormal; Notable for the following:    Sodium 134 (*)     Potassium 3.4 (*)     Creatinine, Ser 0.44 (*)     Total Bilirubin 0.2 (*)     All other components within normal limits  URINALYSIS, ROUTINE W REFLEX MICROSCOPIC - Abnormal; Notable for the following:    APPearance CLOUDY (*)     Ketones, ur 15 (*)     All other components within normal limits  HCG, QUANTITATIVE, PREGNANCY - Abnormal; Notable for the following:    hCG, Beta Chain, [place] Longest [id number] (*)     All other components within normal limits  PREGNANCY, URINE - Abnormal; Notable for the following:    Preg Test, Ur POSITIVE (*)     All other components within normal limits  WET PREP, GENITAL - Abnormal; Notable for the following:    Clue Cells Wet Prep HPF POC MODERATE (*)     WBC, Wet Prep HPF POC MANY (*)     All other components within normal limits  GC/CHLAMYDIA PROBE AMP   US Ob Comp Less 14 Wks  12/22/2012  *RADIOLOGY REPORT*  Clinical Data: Pelvic pain and vaginal bleeding.  OBSTETRIC <14 WK [place] AND TRANSVAGINAL OB US  Technique:  Both transabdominal and transvaginal ultrasound examinations were performed for complete  evaluation of the gestation as well as the maternal uterus, adnexal regions, and pelvic cul-de-sac.  Transvaginal technique was performed to assess early pregnancy.  Comparison:  Prior ultrasound of pregnancy performed 12/14/2012  Intrauterine gestational sac:  Visualized/normal in shape. Yolk sac: Yes Embryo: Yes Cardiac Activity: Yes Heart Rate: 168 bpm  CRL: 26  mm  9 w  3 d         [place] EDC: 07/24/2013  Maternal uterus/adnexae: The uterus is otherwise unremarkable in appearance.  No subchorionic hemorrhage is noted.  The ovaries are within normal limits.  The right ovary measures 3.2 x 2.9 x 2.2 cm, while the left ovary measures 3.2 x 1.5 x 2.0 cm. No suspicious adnexal masses are seen; there is no evidence for ovarian torsion.  No free fluid is seen within the pelvic cul-de-sac.  IMPRESSION: Single live intrauterine pregnancy noted, with a crown-rump length of 2.6 cm, corresponding to a gestational age of [redacted] weeks 3 days. This matches the gestational age of [redacted] weeks 3 days by LMP, reflecting an estimated date of delivery of July 17, 2013.  The quantitative beta HCG level is unusually high; this is nonspecific.   Original Report Authenticated By: Tonia Ghent, M.D.    US Ob Transvaginal  12/22/2012  *RADIOLOGY REPORT*  Clinical Data: Pelvic pain and vaginal bleeding.  OBSTETRIC <14 WK [place] AND TRANSVAGINAL OB US  Technique:  Both transabdominal and transvaginal ultrasound examinations were performed for complete evaluation of the gestation as well as the maternal uterus, adnexal regions, and pelvic cul-de-sac.  Transvaginal technique was performed to assess early pregnancy.  Comparison:  Prior ultrasound of pregnancy performed 12/14/2012  Intrauterine gestational sac:  Visualized/normal in shape. Yolk sac: Yes Embryo: Yes Cardiac Activity: Yes Heart Rate: 168 bpm  CRL: 26  mm  9 w  3 d         [place] EDC: 07/24/2013  Maternal uterus/adnexae: The uterus is otherwise unremarkable in appearance.  No subchorionic  hemorrhage is noted.  The ovaries are within normal limits.  The right ovary measures 3.2 x 2.9 x 2.2 cm, while the left ovary measures 3.2 x 1.5 x 2.0 cm. No suspicious adnexal masses are seen; there is no evidence for ovarian torsion.  No free fluid is seen within the pelvic cul-de-sac.  IMPRESSION: Single live intrauterine pregnancy noted, with a crown-rump length of 2.6 cm, corresponding to a gestational age of [redacted] weeks 3 days. This matches the gestational age of [redacted] weeks 3 days by LMP, reflecting an estimated date of delivery of July 17, 2013.  The quantitative beta HCG level is unusually high; this is nonspecific.   Original Report Authenticated By: Tonia Ghent, M.D.      1. Abdominal pain in pregnancy   2. Nausea/vomiting in pregnancy       MDM  22 year old female with lower abdominal pain with nausea and vomiting in pregnancy. Ultrasound today shows one gestational sac at 9 weeks and 3 days. Patient's pain has improved here after Tylenol. She has had no further vomiting although she is nauseated. Patient is advised that she may have nausea with pregnancy. She should keep something on her stomach at all times. [facility] during this pregnancy.        Olivia Mackie, MD 12/23/12 [phone]

## 2012-12-23 NOTE — ED Notes (Signed)
Pt states she still feels nauseous at times, no vomiting at this time, made aware will be admitted to [facility] hospital.

## 2012-12-23 NOTE — L&D Delivery Note (Signed)
I was present for this delivery.

## 2012-12-24 ENCOUNTER — Encounter (HOSPITAL_COMMUNITY): Payer: Self-pay | Admitting: *Deleted

## 2012-12-24 LAB — COMPREHENSIVE METABOLIC PANEL
Albumin: 3.1 g/dL — ABNORMAL LOW (ref 3.5–5.2)
BUN: 3 mg/dL — ABNORMAL LOW (ref 6–23)
Chloride: 103 mEq/L (ref 96–112)
Creatinine, Ser: 0.45 mg/dL — ABNORMAL LOW (ref 0.50–1.10)
GFR calc Af Amer: >90 mL/min (ref >90)
Glucose, Bld: 79 mg/dL (ref 70–99)
Total Bilirubin: 0.2 mg/dL — ABNORMAL LOW (ref 0.3–1.2)

## 2012-12-24 LAB — GC/CHLAMYDIA PROBE AMP: CT Probe RNA: POSITIVE — AB

## 2012-12-24 MED ORDER — GLYCOPYRROLATE 1 MG PO TABS: 2.0000 mg | ORAL_TABLET | Freq: Three times a day (TID) | ORAL | Status: DC

## 2012-12-24 MED ORDER — METOCLOPRAMIDE HCL 5 MG PO TABS: 5.0000 mg | ORAL_TABLET | Freq: Four times a day (QID) | ORAL | Status: DC

## 2012-12-24 NOTE — Discharge Summary (Signed)
Physician Discharge Summary  Patient ID: Caitlin Sullivan MRN: [id number] DOB/AGE: 1991/10/14 21 y.o.  Admit date: 12/23/2012 Discharge date: 12/24/2012  Admission Diagnoses: Hyperemesis gravidarum  Ptyalism  Dehydration with metabolic disturbance Hypokalemia   Discharge Diagnoses:  Hyperemesis Ptyalism   Discharged Condition: good  Hospital Course: 22 y.o. female G24P0010 Estimated Date of Delivery: 07/24/13 by sonogram 12/23/12 who presented for hyperemesis gravidarum and ptyalism with metabolic disturbance and dehydration, including hypokalemia.  Patient was started on IVF as well as H2 blocker, robinul TID, zofran PRN, reglan TID, and salt/sour therapy.  On the day of discharge she had significantly decreased ptyalism with only mild nausea and no emesis.  She was tolerating a regular diet.  Hypokalemia had resolved after repletion.   Consults: None  Disposition: 01-Home or Self Care  Discharge Orders    Future Appointments: Provider: Department: Dept Phone: Center:   01/07/2013 9:30 AM Adam Phenix, MD [facility] [phone] W[facility]       Medication List     As of 12/24/2012 10:56 PM    TAKE these medications         albuterol 108 (90 BASE) MCG/ACT inhaler   Commonly known as: PROVENTIL HFA;VENTOLIN HFA   Inhale 1-2 puffs into the lungs every 6 (six) hours as needed. For wheezing or shortness of breath      folic acid 800 MCG tablet   Commonly known as: FOLVITE   Take 400 mcg by mouth every morning.      glycopyrrolate 1 MG tablet   Commonly known as: ROBINUL   Take 2 tablets (2 mg total) by mouth 3 (three) times daily.      metoCLOPramide 5 MG tablet   Commonly known as: REGLAN   Take 1 tablet (5 mg total) by mouth 4 (four) times daily.      omeprazole 20 MG capsule   Commonly known as: PRILOSEC   Take 20 mg by mouth every morning.      ondansetron 4 MG tablet   Commonly known as: ZOFRAN   Take 4 mg by mouth every 6 (six) hours as needed. For nausea     prenatal multivitamin Tabs   Take 1 tablet by mouth every morning.      promethazine 25 MG tablet   Commonly known as: PHENERGAN   Take 25 mg by mouth every 6 (six) hours as needed. For nausea      promethazine 25 MG suppository   Commonly known as: PHENERGAN   Place 25 mg rectally every 6 (six) hours as needed. For nausea         Signed: Connor Foxworthy 12/24/2012, 10:56 PM

## 2012-12-24 NOTE — Progress Notes (Signed)
Pt discharged home with friend... Condition stable... No equipment... Ambulated to car with E. Kenslee Achorn, RN.  

## 2012-12-24 NOTE — H&P (Signed)
Caitlin Sullivan is a 22 y.o. female G2P0010 Estimated Date of Delivery: 07/24/13 by sonogram yesterday presenting for hyperemesis gravidarum and ptyalism with metabolic disturbance and dehydration History OB History    Grav Para Term Preterm Abortions TAB SAB Ect Mult Living   2    1 1          Past Medical History  Diagnosis Date  . Asthma   . Umbilical hernia    Past Surgical History  Procedure Date  . No past surgeries   . Dilation and curettage of uterus     at age 46   Family History: family history is not on file. Social History:  reports that she has quit smoking. Her smoking use included Cigarettes. She smoked .25 packs per day. She has never used smokeless tobacco. She reports that she does not drink alcohol or use illicit drugs.   ROS  Review of Systems  Constitutional: Negative for fever, chills, weight loss, malaise/fatigue and diaphoresis.  HENT: Negative for hearing loss, ear pain, nosebleeds, congestion, sore throat, neck pain, tinnitus and ear discharge.   Eyes: Negative for blurred vision, double vision, photophobia, pain, discharge and redness.  Respiratory: Negative for cough, hemoptysis, sputum production, shortness of breath, wheezing and stridor.   Cardiovascular: Negative for chest pain, palpitations, orthopnea, claudication, leg swelling and PND.  Gastrointestinal: Negative for abdominal pain. Genitourinary: Negative for dysuria, urgency, frequency, hematuria and flank pain.  Musculoskeletal: Negative for myalgias, back pain, joint pain and falls.  Skin: Negative for itching and rash.  Neurological: Negative for dizziness, tingling, tremors, sensory change, speech change, focal weakness, seizures, loss of consciousness, weakness and headaches.  Endo/Heme/Allergies: Negative for environmental allergies and polydipsia. Does not bruise/bleed easily.  Psychiatric/Behavioral: Negative for depression, suicidal ideas, hallucinations, memory loss and substance abuse. The  patient is not nervous/anxious and does not have insomnia.       Blood pressure 90/56, pulse 67, temperature 98.2 F (36.8 C), temperature source Oral, resp. rate 18, height 5\' 5"  (1.651 m), weight 78.642 kg (173 lb 6 oz), last menstrual period 09/29/2012, SpO2 98.00%. Exam Physical Exam  Physical Exam  Vitals reviewed. Constitutional: She is oriented to person, place, and time. She appears well-developed and well-nourished.  HENT:  Head: Normocephalic and atraumatic.  Right Ear: External ear normal.  Left Ear: External ear normal.  Nose: Nose normal.  Mouth/Throat: Oropharynx is clear and moist.  Eyes: Conjunctivae and EOM are normal. Pupils are equal, round, and reactive to light. Right eye exhibits no discharge. Left eye exhibits no discharge. No scleral icterus.  Neck: Normal range of motion. Neck supple. No tracheal deviation present. No thyromegaly present.  Cardiovascular: Normal rate, regular rhythm, normal heart sounds and intact distal pulses.  Exam reveals no gallop and no friction rub.   No murmur heard. Respiratory: Effort normal and breath sounds normal. No respiratory distress. She has no wheezes. She has no rales. She exhibits no tenderness.  GI: Soft. Bowel sounds are normal. She exhibits no distension and no mass. There is tenderness. There is no rebound and no guarding.  Genitourinary:       Vulva is normal without lesions Vagina is pink moist without discharge Cervix normal in appearance and pap is normal Uterus is by sonogram 9w 3 days Adnexa is negative with normal sized ovaries by sonogram  Musculoskeletal: Normal range of motion. She exhibits no edema and no tenderness.  Neurological: She is alert and oriented to person, place, and time. She has normal reflexes. She  displays normal reflexes. No cranial nerve deficit. She exhibits normal muscle tone. Coordination normal.  Skin: Skin is warm and dry. No rash noted. No erythema. No pallor.  Psychiatric: She has a  normal mood and affect. Her behavior is normal. Judgment and thought content normal.    Assessment/Plan: 1.  9 weeks 3 days gestation 2.  Hyperemesis with ptyalism and dehydration  H2 blocker, robinul, zofran and salt/sour therapy  Caitlin Sullivan H 12/24/2012, 7:39 AM

## 2012-12-24 NOTE — Discharge Summary (Signed)
Attestation of Attending Supervision of Resident: Evaluation and management procedures were performed by the [facility] Medicine Resident under my supervision.  I have seen and examined the patient, reviewed the resident's note and chart, and I agree with the management and plan.  Anibal Henderson, M.D. 12/24/2012 11:59 PM

## 2012-12-24 NOTE — Progress Notes (Signed)
UR completed 

## 2012-12-24 NOTE — Progress Notes (Signed)
Patient ID: [name], female   DOB: Dec 15, 1991, 22 y.o.   MRN: [id number] 9 weeks 4 days gestation with hyp[ermesis and ptyalism. Has not thrown up in 24 hours and has not been spitting at all.  Filed Vitals:   12/23/12 1752 12/23/12 2135 12/23/12 2230 12/24/12 0559  BP: 111/69 133/65 110/68 90/56  Pulse: 66 73 67 67  Temp: 98.5 F (36.9 C) 98.3 F (36.8 C)  98.2 F (36.8 C)  TempSrc: Oral Oral  Oral  Resp: 16 16 18 18   Height:      Weight:    78.642 kg (173 lb 6 oz)  SpO2: 98% 96% 99% 98%   Recent Results (from the past 48 hour(s))  CBC WITH DIFFERENTIAL   Collection Time   12/23/12  5:42 AM      Component Value Range   WBC 10.4  4.0 - 10.5 K/uL   RBC 4.29  3.87 - 5.11 MIL/uL   Hemoglobin 12.7  12.0 - 15.0 g/dL   HCT 62.1  30.8 - 65.7 %   MCV 85.5  78.0 - 100.0 fL   MCH 29.6  26.0 - 34.0 pg   MCHC 34.6  30.0 - 36.0 g/dL   RDW 84.6  96.2 - 95.2 %   Platelets 259  150 - 400 K/uL   Neutrophils Relative 77  43 - 77 %   Neutro Abs 8.0 (*) 1.7 - 7.7 K/uL   Lymphocytes Relative 17  12 - 46 %   Lymphs Abs 1.7  0.7 - 4.0 K/uL   Monocytes Relative 6  3 - 12 %   Monocytes Absolute 0.6  0.1 - 1.0 K/uL   Eosinophils Relative 0  0 - 5 %   Eosinophils Absolute 0.0  0.0 - 0.7 K/uL   Basophils Relative 0  0 - 1 %   Basophils Absolute 0.0  0.0 - 0.1 K/uL  BASIC METABOLIC PANEL   Collection Time   12/23/12  5:42 AM      Component Value Range   Sodium 132 (*) 135 - 145 mEq/L   Potassium 3.2 (*) 3.5 - 5.1 mEq/L   Chloride 100  96 - 112 mEq/L   CO2 24  19 - 32 mEq/L   Glucose, Bld 90  70 - 99 mg/dL   BUN 5 (*) 6 - 23 mg/dL   Creatinine, Ser 8.41 (*) 0.50 - 1.10 mg/dL   Calcium 9.6  8.4 - 32.4 mg/dL   GFR calc non Af Amer >90  >90 mL/min   GFR calc Af Amer >90  >90 mL/min  URINALYSIS, ROUTINE W REFLEX MICROSCOPIC   Collection Time   12/23/12  6:45 AM      Component Value Range   Color, Urine YELLOW  YELLOW   APPearance TURBID (*) CLEAR   Specific Gravity, Urine 1.020  1.005 -  1.030   pH 8.5 (*) 5.0 - 8.0   Glucose, UA NEGATIVE  NEGATIVE mg/dL   Hgb urine dipstick NEGATIVE  NEGATIVE   Bilirubin Urine NEGATIVE  NEGATIVE   Ketones, ur >80 (*) NEGATIVE mg/dL   Protein, ur NEGATIVE  NEGATIVE mg/dL   Urobilinogen, UA 1.0  0.0 - 1.0 mg/dL   Nitrite NEGATIVE  NEGATIVE   Leukocytes, UA NEGATIVE  NEGATIVE  URINE MICROSCOPIC-ADD ON   Collection Time   12/23/12  6:45 AM      Component Value Range   Squamous Epithelial / LPF RARE  RARE   WBC, UA 0-2  <3 WBC/hpf  Bacteria, UA FEW (*) RARE   Urine-Other AMORPHOUS URATES/PHOSPHATES    COMPREHENSIVE METABOLIC PANEL   Collection Time   12/24/12  5:40 AM      Component Value Range   Sodium 134 (*) 135 - 145 mEq/L   Potassium 3.8  3.5 - 5.1 mEq/L   Chloride 103  96 - 112 mEq/L   CO2 21  19 - 32 mEq/L   Glucose, Bld 79  70 - 99 mg/dL   BUN 3 (*) 6 - 23 mg/dL   Creatinine, Ser 1.61 (*) 0.50 - 1.10 mg/dL   Calcium 9.2  8.4 - 09.6 mg/dL   Total Protein 6.1  6.0 - 8.3 g/dL   Albumin 3.1 (*) 3.5 - 5.2 g/dL   AST 13  0 - 37 U/L   ALT 17  0 - 35 U/L   Alkaline Phosphatase 42  39 - 117 U/L   Total Bilirubin 0.2 (*) 0.3 - 1.2 mg/dL   GFR calc non Af Amer >90  >90 mL/min   GFR calc Af Amer >90  >90 mL/min   12/22/2012 *RADIOLOGY REPORT* Clinical Data: Pelvic pain and vaginal bleeding. OBSTETRIC <14 WK [place] AND TRANSVAGINAL OB US Technique: Both transabdominal and transvaginal ultrasound examinations were performed for complete evaluation of the gestation as well as the maternal uterus, adnexal regions, and pelvic cul-de-sac. Transvaginal technique was performed to assess early pregnancy. Comparison: Prior ultrasound of pregnancy performed 12/14/2012 Intrauterine gestational sac: Visualized/normal in shape. Yolk sac: Yes Embryo: Yes Cardiac Activity: Yes Heart Rate: 168 bpm CRL: 26 mm 9 w 3 d [place] EDC: 07/24/2013 Maternal uterus/adnexae: The uterus is otherwise unremarkable in appearance. No subchorionic hemorrhage is noted. The ovaries  are within normal limits. The right ovary measures 3.2 x 2.9 x 2.2 cm, while the left ovary measures 3.2 x 1.5 x 2.0 cm. No suspicious adnexal masses are seen; there is no evidence for ovarian torsion. No free fluid is seen within the pelvic cul-de-sac. IMPRESSION: Single live intrauterine pregnancy noted, with a crown-rump length of 2.6 cm, corresponding to a gestational age of [redacted] weeks 3 days. This matches the gestational age of [redacted] weeks 3 days by LMP, reflecting an estimated date of delivery of July 17, 2013. The quantitative beta HCG level is unusually high; this is nonspecific. Original Report Authenticated By: Tonia Ghent, M.D.    Assessment 9 weeks 4 days HG, ptyalism  Improved  Begin diet today and may be be able to go home after lunch if tolerates Continue protonix, robinul, zofran, and sour/salty.  EURE,LUTHER H 12/24/2012 7:51 AM

## 2012-12-25 NOTE — ED Notes (Signed)
+   Gonorrhea + Chlamydia Chart sent to EDP office for review. 

## 2012-12-26 ENCOUNTER — Telehealth (HOSPITAL_COMMUNITY): Payer: Self-pay | Admitting: Emergency Medicine

## 2012-12-26 ENCOUNTER — Emergency Department (HOSPITAL_COMMUNITY)
Admission: EM | Admit: 2012-12-26 | Discharge: 2012-12-26 | Disposition: A | Discharge status: Home / Self Care | Payer: Medicaid Other | Attending: Emergency Medicine | Admitting: Emergency Medicine

## 2012-12-26 ENCOUNTER — Encounter (HOSPITAL_COMMUNITY): Payer: Self-pay | Admitting: Emergency Medicine

## 2012-12-26 DIAGNOSIS — R109 Unspecified abdominal pain: Secondary | ICD-10-CM | POA: Insufficient documentation

## 2012-12-26 DIAGNOSIS — R1110 Vomiting, unspecified: Secondary | ICD-10-CM

## 2012-12-26 DIAGNOSIS — Z3490 Encounter for supervision of normal pregnancy, unspecified, unspecified trimester: Secondary | ICD-10-CM

## 2012-12-26 DIAGNOSIS — Z79899 Other long term (current) drug therapy: Secondary | ICD-10-CM | POA: Insufficient documentation

## 2012-12-26 DIAGNOSIS — N949 Unspecified condition associated with female genital organs and menstrual cycle: Secondary | ICD-10-CM | POA: Insufficient documentation

## 2012-12-26 DIAGNOSIS — Z8719 Personal history of other diseases of the digestive system: Secondary | ICD-10-CM | POA: Insufficient documentation

## 2012-12-26 DIAGNOSIS — O98219 Gonorrhea complicating pregnancy, unspecified trimester: Secondary | ICD-10-CM | POA: Insufficient documentation

## 2012-12-26 DIAGNOSIS — A549 Gonococcal infection, unspecified: Secondary | ICD-10-CM

## 2012-12-26 DIAGNOSIS — A5400 Gonococcal infection of lower genitourinary tract, unspecified: Secondary | ICD-10-CM | POA: Insufficient documentation

## 2012-12-26 DIAGNOSIS — O9989 Other specified diseases and conditions complicating pregnancy, childbirth and the puerperium: Secondary | ICD-10-CM | POA: Insufficient documentation

## 2012-12-26 DIAGNOSIS — O98519 Other viral diseases complicating pregnancy, unspecified trimester: Secondary | ICD-10-CM | POA: Insufficient documentation

## 2012-12-26 DIAGNOSIS — O219 Vomiting of pregnancy, unspecified: Secondary | ICD-10-CM | POA: Insufficient documentation

## 2012-12-26 DIAGNOSIS — A749 Chlamydial infection, unspecified: Secondary | ICD-10-CM | POA: Insufficient documentation

## 2012-12-26 DIAGNOSIS — J45909 Unspecified asthma, uncomplicated: Secondary | ICD-10-CM | POA: Insufficient documentation

## 2012-12-26 DIAGNOSIS — Z87891 Personal history of nicotine dependence: Secondary | ICD-10-CM | POA: Insufficient documentation

## 2012-12-26 LAB — CBC WITH DIFFERENTIAL/PLATELET
Hemoglobin: 13.7 g/dL (ref 12.0–15.0)
Lymphocytes Relative: 16 % (ref 12–46)
Lymphs Abs: 1.4 10*3/uL (ref 0.7–4.0)
Monocytes Relative: 5 % (ref 3–12)
Neutro Abs: 7.2 10*3/uL (ref 1.7–7.7)
Neutrophils Relative %: 79 % — ABNORMAL HIGH (ref 43–77)
Platelets: 264 10*3/uL (ref 150–400)
RBC: 4.6 MIL/uL (ref 3.87–5.11)
WBC: 9.1 10*3/uL (ref 4.0–10.5)

## 2012-12-26 LAB — BASIC METABOLIC PANEL
GFR calc Af Amer: >90 mL/min (ref >90)
GFR calc non Af Amer: >90 mL/min (ref >90)
Glucose, Bld: 87 mg/dL (ref 70–99)
Potassium: 3.5 mEq/L (ref 3.5–5.1)
Sodium: 135 mEq/L (ref 135–145)

## 2012-12-26 MED ORDER — ONDANSETRON HCL 4 MG/2ML IJ SOLN
4.0000 mg | Freq: Once | INTRAMUSCULAR | Status: AC
  Administered 2012-12-26: 4 mg via INTRAVENOUS
  Filled 2012-12-26: qty 2

## 2012-12-26 MED ORDER — CEFTRIAXONE SODIUM 250 MG IJ SOLR
250.0000 mg | Freq: Once | INTRAMUSCULAR | Status: AC
  Administered 2012-12-26: 250 mg via INTRAMUSCULAR
  Filled 2012-12-26: qty 250

## 2012-12-26 MED ORDER — SODIUM CHLORIDE 0.9 % IV SOLN
Freq: Once | INTRAVENOUS | Status: AC
  Administered 2012-12-26: 15:00:00 via INTRAVENOUS

## 2012-12-26 MED ORDER — AZITHROMYCIN 250 MG PO TABS
1000.0000 mg | ORAL_TABLET | Freq: Once | ORAL | Status: AC
  Administered 2012-12-26: 1000 mg via ORAL
  Filled 2012-12-26: qty 4

## 2012-12-26 NOTE — ED Notes (Signed)
1/4 Chart returned from MD office.  Chart reviewed by Dr Ignacia Palma needs" Tx of Rocephin 250 mg IM & Zithromax 1 gram po, 1 x dose for tx of gonorrhea and chlamydia".  Called pt. ID verified x 2.  Pt informed of dx, need for addl tx, notify partner(s) for testing and tx and abstain from sex x 2 wks.  Pt stated will return for tx.

## 2012-12-26 NOTE — ED Provider Notes (Signed)
History     CSN: [id number]  Arrival date & time 12/26/12  1359   First MD Initiated Contact with Patient 12/26/12 1421      Chief Complaint  Patient presents with  . SEXUALLY TRANSMITTED DISEASE  . Emesis During Pregnancy    (Consider location/radiation/quality/duration/timing/severity/associated sxs/prior treatment) HPI Comments: Patient is a G1P1001 at eight weeks gestation.  She was told to come in for treatment of std.  She was here on 12/30 for spotting, then sent to [facility] for further eval, then discharged.  She was called today and told to come in for treatment of GC,Chlamydia.  She continues with nausea and vomiting and lower abdominal pain.  She is no longer spotting or bleeding.  She denies fevers or chills. No urinary complaints.    The history is provided by the patient.    Past Medical History  Diagnosis Date  . Asthma   . Umbilical hernia     Past Surgical History  Procedure Date  . No past surgeries   . Dilation and curettage of uterus     at age 52    History reviewed. No pertinent family history.  History  Substance Use Topics  . Smoking status: Former Smoker -- 0.2 packs/day    Types: Cigarettes  . Smokeless tobacco: Never Used  . Alcohol Use: No    OB History    Grav Para Term Preterm Abortions TAB SAB Ect Mult Living   2    1 1           Review of Systems  All other systems reviewed and are negative.    Allergies  Review of patient's allergies indicates no known allergies.  Home Medications   Current Outpatient Rx  Name  Route  Sig  Dispense  Refill  . ALBUTEROL SULFATE HFA 108 (90 BASE) MCG/ACT IN AERS   Inhalation   Inhale 1-2 puffs into the lungs every 6 (six) hours as needed. For wheezing or shortness of breath         . FOLIC ACID 800 MCG PO TABS   Oral   Take 400 mcg by mouth every morning.         Marland Kitchen GLYCOPYRROLATE 1 MG PO TABS   Oral   Take 2 mg by mouth 3 (three) times daily.         Marland Kitchen OMEPRAZOLE 20 MG PO  CPDR   Oral   Take 20 mg by mouth every morning.         Marland Kitchen ONDANSETRON HCL 4 MG PO TABS   Oral   Take 4 mg by mouth every 6 (six) hours as needed. For nausea         . PRENATAL MULTIVITAMIN CH   Oral   Take 1 tablet by mouth every morning.          Marland Kitchen PROMETHAZINE HCL 25 MG RE SUPP   Rectal   Place 25 mg rectally every 6 (six) hours as needed. For nausea         . PROMETHAZINE HCL 25 MG PO TABS   Oral   Take 25 mg by mouth every 6 (six) hours as needed. For nausea           BP 128/75  Pulse 105  Temp 98.4 F (36.9 C) (Oral)  Resp 18  SpO2 99%  LMP 09/29/2012  Physical Exam  Nursing note and vitals reviewed. Constitutional: She is oriented to person, place, and time. She appears well-developed and well-nourished.  No distress.       She is anxious, hyperventilating.    HENT:  Head: Normocephalic and atraumatic.  Mouth/Throat: Oropharynx is clear and moist.  Neck: Normal range of motion. Neck supple.  Cardiovascular: Normal rate and regular rhythm.   No murmur heard. Pulmonary/Chest: Effort normal and breath sounds normal.  Abdominal: Soft. Bowel sounds are normal. She exhibits no distension.       There is ttp in the lower abdomen.  No rebound or guarding.  Bowel sounds are present.  Lymphadenopathy:    She has no cervical adenopathy.  Neurological: She is alert and oriented to person, place, and time.  Skin: Skin is warm and dry. She is not diaphoretic.    ED Course  Procedures (including critical care time)   Labs Reviewed  BASIC METABOLIC PANEL  CBC WITH DIFFERENTIAL   No results found.   No diagnosis found.    MDM  The patient presents with continued vomiting, pelvic pain.  She was seen on 12/20 for the same.  Cultures returned positive for chlamydia and gonorrhea.  She has been treated with rocephin and zithromax for this.  She was also given iv hydration and zofran.  She will be discharged to home with zofran, follow up with  OB/Gyn.        Geoffery Lyons, MD 12/26/12 [phone]

## 2012-12-26 NOTE — ED Notes (Signed)
Pt was seen at [facility] 1/2 for emesis during pregnancy and abd cramping. Pt was swabbed for GC/Chlamydia. Pt received call today notifying her she was positive for both and to return for treatment.  Pt states she is also having abd cramping and vomiting again.

## 2012-12-26 NOTE — ED Notes (Signed)
Pt escorted to discharge window. Verbalized understanding discharge instructions. In no acute distress.   

## 2012-12-26 NOTE — ED Notes (Signed)
Patient called and informed of +chlamydia and +gonorrhea. Patient informed that she needed to return to the ED for treatment of +chalmydia and +gonorrhea. Patient returned on 1/04 to receive treatment for STDs.

## 2012-12-26 NOTE — ED Notes (Addendum)
Chart returned from EDP office. Prescribed Zithromax 1 gram PO and Rocephin 250 mg/IM. Prescribed by Carleene Cooper MD.

## 2013-01-03 ENCOUNTER — Encounter (HOSPITAL_COMMUNITY): Payer: Self-pay | Admitting: *Deleted

## 2013-01-03 ENCOUNTER — Inpatient Hospital Stay (HOSPITAL_COMMUNITY)
Admission: AD | Admit: 2013-01-03 | Discharge: 2013-01-04 | Disposition: A | Discharge status: Hospice | Payer: Medicaid Other | Source: Ambulatory Visit | Attending: Obstetrics & Gynecology | Admitting: Obstetrics & Gynecology

## 2013-01-03 DIAGNOSIS — O210 Mild hyperemesis gravidarum: Secondary | ICD-10-CM

## 2013-01-03 DIAGNOSIS — O219 Vomiting of pregnancy, unspecified: Secondary | ICD-10-CM

## 2013-01-03 HISTORY — DX: Mild hyperemesis gravidarum: O21.0

## 2013-01-03 LAB — URINALYSIS, ROUTINE W REFLEX MICROSCOPIC
Glucose, UA: NEGATIVE mg/dL
Hgb urine dipstick: NEGATIVE
Leukocytes, UA: NEGATIVE
Specific Gravity, Urine: 1.025 (ref 1.005–1.030)
Urobilinogen, UA: 0.2 mg/dL (ref 0.0–1.0)

## 2013-01-03 LAB — COMPREHENSIVE METABOLIC PANEL
CO2: 23 mEq/L (ref 19–32)
Calcium: 9.3 mg/dL (ref 8.4–10.5)
Chloride: 102 mEq/L (ref 96–112)
Creatinine, Ser: 0.49 mg/dL — ABNORMAL LOW (ref 0.50–1.10)
GFR calc Af Amer: >90 mL/min (ref >90)
GFR calc non Af Amer: >90 mL/min (ref >90)
Glucose, Bld: 89 mg/dL (ref 70–99)
Total Bilirubin: 0.2 mg/dL — ABNORMAL LOW (ref 0.3–1.2)

## 2013-01-03 LAB — CBC
HCT: 34.8 % — ABNORMAL LOW (ref 36.0–46.0)
Hemoglobin: 12.1 g/dL (ref 12.0–15.0)
MCH: 29.9 pg (ref 26.0–34.0)
MCV: 85.9 fL (ref 78.0–100.0)
RBC: 4.05 MIL/uL (ref 3.87–5.11)

## 2013-01-03 MED ORDER — LACTATED RINGERS IV BOLUS (SEPSIS)
1000.0000 mL | Freq: Once | INTRAVENOUS | Status: AC
  Administered 2013-01-03: 1000 mL via INTRAVENOUS

## 2013-01-03 MED ORDER — ONDANSETRON HCL 4 MG/2ML IJ SOLN
4.0000 mg | Freq: Once | INTRAMUSCULAR | Status: AC
  Administered 2013-01-03: 4 mg via INTRAVENOUS
  Filled 2013-01-03: qty 2

## 2013-01-03 MED ORDER — ONDANSETRON 8 MG PO TBDP: 8.0000 mg | ORAL_TABLET | Freq: Three times a day (TID) | ORAL | Status: DC | PRN

## 2013-01-03 NOTE — MAU Provider Note (Signed)
History     CSN: [id number]  Arrival date and time: 01/03/13 2122   First Provider Initiated Contact with Patient 01/03/13 2215      Chief Complaint  Patient presents with  . Emesis During Pregnancy   HPI Caitlin Sullivan is a 22 y.o. female @ [redacted]w[redacted]d gestation who presents to MAU with nausea and vomiting. She has had several visits to the [facility] ED and to MAU for same. She was admitted with hyperemesis approximately 2 weeks ago. She was also dx with with GC and Chlamydia by cultures done at [facility] ED 12/21/12. She did go back for treatment. This episode of nausea and vomiting started   She has been taking Zofran for nausea and it has caused constipation but worked well. Ran out of Zofran 3 days ago and started vomiting again.  OB History    Grav Para Term Preterm Abortions TAB SAB Ect Mult Living   2    1 1           Past Medical History  Diagnosis Date  . Asthma   . Umbilical hernia   . Hyperemesis arising during pregnancy     Past Surgical History  Procedure Date  . No past surgeries   . Dilation and curettage of uterus     at age 2    Family History  Problem Relation Age of Onset  . Cancer Mother   . Hypertension Mother   . Diabetes Father   . Hypertension Father   . Hyperlipidemia Father   . Stroke Father   . Asthma Brother     History  Substance Use Topics  . Smoking status: Former Smoker -- 0.2 packs/day    Types: Cigarettes    Quit date: 11/03/2012  . Smokeless tobacco: Never Used  . Alcohol Use: No    Allergies: No Known Allergies  Prescriptions prior to admission  Medication Sig Dispense Refill  . albuterol (PROVENTIL HFA;VENTOLIN HFA) 108 (90 BASE) MCG/ACT inhaler Inhale 1-2 puffs into the lungs every 6 (six) hours as needed. For wheezing or shortness of breath      . folic acid (FOLVITE) 800 MCG tablet Take 400 mcg by mouth every morning.      Marland Kitchen glycopyrrolate (ROBINUL) 1 MG tablet Take 2 mg by mouth 3 (three) times daily.      Marland Kitchen omeprazole (PRILOSEC)  20 MG capsule Take 20 mg by mouth every morning.      . ondansetron (ZOFRAN) 4 MG tablet Take 4 mg by mouth every 6 (six) hours as needed. For nausea      . Prenatal Vit-Fe Fumarate-FA (PRENATAL MULTIVITAMIN) TABS Take 1 tablet by mouth every morning.       . promethazine (PHENERGAN) 25 MG suppository Place 25 mg rectally every 6 (six) hours as needed. For nausea      . promethazine (PHENERGAN) 25 MG tablet Take 25 mg by mouth every 6 (six) hours as needed. For nausea        Review of Systems  Constitutional: Negative for fever and chills.  Eyes: Negative for blurred vision and double vision.  Respiratory: Negative for cough and wheezing.   Cardiovascular: Negative for chest pain and leg swelling.  Gastrointestinal: Positive for nausea, vomiting and constipation.  Genitourinary: Negative for dysuria, urgency and frequency.  Musculoskeletal: Negative for back pain.  Skin: Negative for rash.  Neurological: Negative for dizziness and headaches.  Psychiatric/Behavioral: Negative for depression. The patient is not nervous/anxious.    Blood pressure 124/72, pulse 82,  temperature 98.3 F (36.8 C), temperature source Oral, resp. rate 18, height 5\' 5"  (1.651 m), weight 175 lb (79.379 kg), last menstrual period 09/29/2012, SpO2 100.00%.  Physical Exam  Nursing note and vitals reviewed. Constitutional: She is oriented to person, place, and time. She appears well-developed and well-nourished. No distress.  HENT:  Head: Normocephalic and atraumatic.  Eyes: EOM are normal.  Neck: Neck supple.  Cardiovascular: Normal rate.   Respiratory: Effort normal.  GI: Soft. There is tenderness in the epigastric area.  Musculoskeletal: Normal range of motion.  Neurological: She is alert and oriented to person, place, and time.  Skin: Skin is warm and dry.  Psychiatric: She has a normal mood and affect. Her behavior is normal. Judgment and thought content normal.   Procedures  Results for orders placed  during the hospital encounter of 01/03/13 (from the past 24 hour(s))  URINALYSIS, ROUTINE W REFLEX MICROSCOPIC     Status: Abnormal   Collection Time   01/03/13  9:35 PM      Component Value Range   Color, Urine YELLOW  YELLOW   APPearance HAZY (*) CLEAR   Specific Gravity, Urine 1.025  1.005 - 1.030   pH 6.5  5.0 - 8.0   Glucose, UA NEGATIVE  NEGATIVE mg/dL   Hgb urine dipstick NEGATIVE  NEGATIVE   Bilirubin Urine NEGATIVE  NEGATIVE   Ketones, ur 40 (*) NEGATIVE mg/dL   Protein, ur NEGATIVE  NEGATIVE mg/dL   Urobilinogen, UA 0.2  0.0 - 1.0 mg/dL   Nitrite NEGATIVE  NEGATIVE   Leukocytes, UA NEGATIVE  NEGATIVE  CBC     Status: Abnormal   Collection Time   01/03/13 10:32 PM      Component Value Range   WBC 10.0  4.0 - 10.5 K/uL   RBC 4.05  3.87 - 5.11 MIL/uL   Hemoglobin 12.1  12.0 - 15.0 g/dL   HCT 16.1 (*) 09.6 - 04.5 %   MCV 85.9  78.0 - 100.0 fL   MCH 29.9  26.0 - 34.0 pg   MCHC 34.8  30.0 - 36.0 g/dL   RDW 40.9  81.1 - 91.4 %   Platelets 242  150 - 400 K/uL  COMPREHENSIVE METABOLIC PANEL     Status: Abnormal   Collection Time   01/03/13 10:32 PM      Component Value Range   Sodium 137  135 - 145 mEq/L   Potassium 3.5  3.5 - 5.1 mEq/L   Chloride 102  96 - 112 mEq/L   CO2 23  19 - 32 mEq/L   Glucose, Bld 89  70 - 99 mg/dL   BUN 4 (*) 6 - 23 mg/dL   Creatinine, Ser 7.82 (*) 0.50 - 1.10 mg/dL   Calcium 9.3  8.4 - 95.6 mg/dL   Total Protein 6.7  6.0 - 8.3 g/dL   Albumin 3.5  3.5 - 5.2 g/dL   AST 16  0 - 37 U/L   ALT 20  0 - 35 U/L   Alkaline Phosphatase 42  39 - 117 U/L   Total Bilirubin 0.2 (*) 0.3 - 1.2 mg/dL   GFR calc non Af Amer >90  >90 mL/min   GFR calc Af Amer >90  >90 mL/min   Assessment: 22 y.o. female @ [redacted]w[redacted]d gestation with nausea and vomiting  Plan:  Refill Zofran   Continue Prilosec    Follow up with OB, return here as needed  Discussed with the patient and all questioned fully  answered. She will return if any problems arise.   Medication List      As of 01/03/2013 11:48 PM    START taking these medications         ondansetron 8 MG disintegrating tablet   Commonly known as: ZOFRAN-ODT   Take 1 tablet (8 mg total) by mouth every 8 (eight) hours as needed for nausea.      CONTINUE taking these medications         albuterol 108 (90 BASE) MCG/ACT inhaler   Commonly known as: PROVENTIL HFA;VENTOLIN HFA      folic acid 800 MCG tablet   Commonly known as: FOLVITE      glycopyrrolate 1 MG tablet   Commonly known as: ROBINUL      omeprazole 20 MG capsule   Commonly known as: PRILOSEC      ondansetron 4 MG tablet   Commonly known as: ZOFRAN      prenatal multivitamin Tabs      * promethazine 25 MG tablet   Commonly known as: PHENERGAN      * promethazine 25 MG suppository   Commonly known as: PHENERGAN     * Notice: This list has 2 medication(s) that are the same as other medications prescribed for you. Read the directions carefully, and ask your doctor or other care provider to review them with you.        Where to get your medications    These are the prescriptions that you need to pick up. We sent them to a specific pharmacy, so you will need to go there to get them.   [id number] - [place] ([place]), [place] - [address]    [address] [place] ([place]) [place] [address]    Phone: [phone]        ondansetron 8 MG disintegrating tablet            NEESE,HOPE, RN, FNP, [facility] 01/03/2013, 10:27 PM

## 2013-01-03 NOTE — MAU Note (Signed)
Pt was on zofran and phenergan and ran out of her prescription three days ago.  [facility]

## 2013-01-03 NOTE — MAU Note (Signed)
Pt reports she was hospitalized x 4 days about 2 weeks ago for hyperemesis, was ok for the last week but has not been able to stop vomiting today.

## 2013-01-04 MED ORDER — PROMETHAZINE HCL 25 MG PO TABS: 12.5000 mg | ORAL_TABLET | Freq: Four times a day (QID) | ORAL | Status: DC | PRN

## 2013-01-07 ENCOUNTER — Encounter: Payer: Self-pay | Admitting: *Deleted

## 2013-01-07 ENCOUNTER — Encounter: Payer: Self-pay | Admitting: Obstetrics & Gynecology

## 2013-01-07 ENCOUNTER — Ambulatory Visit (INDEPENDENT_AMBULATORY_CARE_PROVIDER_SITE_OTHER): Payer: Medicaid Other | Admitting: Obstetrics & Gynecology

## 2013-01-07 ENCOUNTER — Other Ambulatory Visit: Payer: Self-pay | Admitting: Obstetrics & Gynecology

## 2013-01-07 ENCOUNTER — Other Ambulatory Visit (HOSPITAL_COMMUNITY)
Admission: RE | Admit: 2013-01-07 | Discharge: 2013-01-07 | Disposition: A | Discharge status: Home / Self Care | Payer: Medicaid Other | Source: Ambulatory Visit | Attending: Obstetrics & Gynecology | Admitting: Obstetrics & Gynecology

## 2013-01-07 ENCOUNTER — Other Ambulatory Visit (HOSPITAL_COMMUNITY): Payer: Self-pay | Admitting: Obstetrics & Gynecology

## 2013-01-07 VITALS — BP 125/81 | Temp 97.7°F | Wt 177.2 lb

## 2013-01-07 DIAGNOSIS — Z113 Encounter for screening for infections with a predominantly sexual mode of transmission: Secondary | ICD-10-CM | POA: Insufficient documentation

## 2013-01-07 DIAGNOSIS — J45909 Unspecified asthma, uncomplicated: Secondary | ICD-10-CM

## 2013-01-07 DIAGNOSIS — Z3400 Encounter for supervision of normal first pregnancy, unspecified trimester: Secondary | ICD-10-CM

## 2013-01-07 DIAGNOSIS — O0991 Supervision of high risk pregnancy, unspecified, first trimester: Secondary | ICD-10-CM

## 2013-01-07 DIAGNOSIS — Z3682 Encounter for antenatal screening for nuchal translucency: Secondary | ICD-10-CM

## 2013-01-07 DIAGNOSIS — O210 Mild hyperemesis gravidarum: Secondary | ICD-10-CM

## 2013-01-07 DIAGNOSIS — A549 Gonococcal infection, unspecified: Secondary | ICD-10-CM

## 2013-01-07 DIAGNOSIS — A5400 Gonococcal infection of lower genitourinary tract, unspecified: Secondary | ICD-10-CM

## 2013-01-07 LAB — POCT URINALYSIS DIP (DEVICE)
Glucose, UA: NEGATIVE mg/dL
Leukocytes, UA: NEGATIVE
Nitrite: NEGATIVE
Protein, ur: NEGATIVE mg/dL
Specific Gravity, Urine: >=1.03 (ref 1.005–1.030)
Urobilinogen, UA: 0.2 mg/dL (ref 0.0–1.0)

## 2013-01-07 MED ORDER — PROMETHAZINE HCL 25 MG PO TABS: 25.0000 mg | ORAL_TABLET | Freq: Four times a day (QID) | ORAL | Status: DC | PRN

## 2013-01-07 NOTE — Progress Notes (Signed)
Nutrition note: 1st visit consult Pt has gained 2.2# @ [redacted]w[redacted]d, which is wnl. Pt reports eating 3-4x/d. Pt reports she is not taking PNV. Pt reports N&V, heartburn, and constipation. Pt stated the medications are helping with the N&V and heartburn. Pt received verbal & written education on general nutrition during pregnancy. Disc tips to decrease N&V, heartburn and constipation. Disc option of taking 2 chewable multivitamins vs PNV. Disc wt gain goals of 15-25# or 0.6#/wk.  Pt agrees to take PNV or equivalent and to continue meds as instructed by PCP. Pt does not receive WIC but plans to apply. Pt plans to BF. F/u if referred Caitlin Reveal, MS, RD, LDN

## 2013-01-07 NOTE — Progress Notes (Signed)
New OB, hyperemesis currently treated  Subjective:    Caitlin Sullivan is a G2P0010 [redacted]w[redacted]d being seen today for her first obstetrical visit.  Her obstetrical history is significant for hyperemesis gravidarum. Patient does intend to breast feed. Pregnancy history fully reviewed.  Patient reports nausea and vomiting.  Filed Vitals:   01/07/13 0955  BP: 125/81  Temp: 97.7 F (36.5 C)  Weight: 177 lb 3.2 oz (80.377 kg)    HISTORY: OB History    Grav Para Term Preterm Abortions TAB SAB Ect Mult Living   2    1 1          # Outc Date GA Lbr Len/2nd Wgt Sex Del Anes PTL Lv   1 TAB            2 CUR              Past Medical History  Diagnosis Date  . Umbilical hernia   . Hyperemesis arising during pregnancy   . Asthma    Past Surgical History  Procedure Date  . No past surgeries   . Dilation and curettage of uterus     at age 37   Family History  Problem Relation Age of Onset  . Cancer Mother   . Hypertension Mother   . Diabetes Father   . Hypertension Father   . Hyperlipidemia Father   . Stroke Father   . Asthma Brother      Exam    Uterus:     Pelvic Exam:    Perineum: No Hemorrhoids   Vulva: normal   Vagina:  normal mucosa   pH:    Cervix: no lesions   Adnexa: normal adnexa   Bony Pelvis: average  System: Breast:  normal appearance, no masses or tenderness   Skin: normal coloration and turgor, no rashes    Neurologic: oriented, normal, normal mood   Extremities: normal strength, tone, and muscle mass   HEENT has dental caries   Mouth/Teeth mucous membranes moist, pharynx normal without lesions   Neck supple   Cardiovascular: regular rate and rhythm   Respiratory:  appears well, vitals normal, no respiratory distress, acyanotic, normal RR, ear and throat exam is normal, neck free of mass or lymphadenopathy, chest clear, no wheezing, crepitations, rhonchi, normal symmetric air entry   Abdomen: soft, non-tender; bowel sounds normal; no masses,  no  organomegaly   Urinary: urethral meatus normal      Assessment:    Pregnancy: G2P0010 Patient Active Problem List  Diagnosis  . Hyperemesis gravidarum  . Asthma  . Supervision of normal first pregnancy        Plan:     Initial labs drawn. Prenatal vitamins. Problem list reviewed and updated. Genetic Screening discussed First Screen: requested.  Ultrasound discussed; fetal survey: 18-20 weeks.  Follow up in 4 weeks. 50% of 30 min visit spent on counseling and coordination of care.  Continue meds for nausea   Emie Sommerfeld 01/07/2013

## 2013-01-07 NOTE — Progress Notes (Signed)
P=95  Had some bleeding similar to a period in Dec.  Patient says they identified two gestational sacs and thinks she may have miscarried one.  She had severe cramping during the bleeding.  Patient still has some mild cramping but is no longer bleeding.

## 2013-01-07 NOTE — Patient Instructions (Signed)
Hyperemesis Gravidarum  Hyperemesis gravidarum is a severe form of nausea and vomiting that happens during pregnancy. Hyperemesis is worse than morning sickness. It may cause a woman to have nausea or vomiting all day for many days. It may keep a woman from eating and drinking enough food and liquids. Hyperemesis usually occurs during the first half (the first 20 weeks) of pregnancy. It often goes away once a woman is in her second half of pregnancy. However, sometimes hyperemesis continues through an entire pregnancy.   CAUSES   The cause of this condition is not completely known but is thought to be due to changes in the body's hormones when pregnant. It could be the high level of the pregnancy hormone or an increase in estrogen in the body.   SYMPTOMS    Severe nausea and vomiting.   Nausea that does not go away.   Vomiting that does not allow you to keep any food down.   Weight loss and body fluid loss (dehydration).   Having no desire to eat or not liking food you have previously enjoyed.  DIAGNOSIS   Your caregiver may ask you about your symptoms. Your caregiver may also order blood tests and urine tests to make sure something else is not causing the problem.   TREATMENT   You may only need medicine to control the problem. If medicines do not control the nausea and vomiting, you will be treated in the hospital to prevent dehydration, acidosis, weight loss, and changes in the electrolytes in your body that may harm the unborn baby (fetus). You may need intravenous (IV) fluids.   HOME CARE INSTRUCTIONS    Take all medicine as directed by your caregiver.   Try eating a couple of dry crackers or toast in the morning before getting out of bed.   Avoid foods and smells that upset your stomach.   Avoid fatty and spicy foods. Eat 5 to 6 small meals a day.   Do not drink when eating meals. Drink between meals.   For snacks, eat high protein foods, such as cheese. Eat or suck on things that have ginger in  them. Ginger helps nausea.   Avoid food preparation. The smell of food can spoil your appetite.   Avoid iron pills and iron in your multivitamins until after 3 to 4 months of being pregnant.  SEEK MEDICAL CARE IF:    Your abdominal pain increases since the last time you saw your caregiver.   You have a severe headache.   You develop vision problems.   You feel you are losing weight.  SEEK IMMEDIATE MEDICAL CARE IF:    You are unable to keep fluids down.   You vomit blood.   You have constant nausea and vomiting.   You have a fever.   You have excessive weakness, dizziness, fainting, or extreme thirst.  MAKE SURE YOU:    Understand these instructions.   Will watch your condition.   Will get help right away if you are not doing well or get worse.  Document Released: 12/09/2005 Document Revised: 03/02/2012 Document Reviewed: 03/11/2011  [employer] Patient Information 2013 [employer].

## 2013-01-08 LAB — OBSTETRIC PANEL
Basophils Relative: 0 % (ref 0–1)
Eosinophils Absolute: 0.1 10*3/uL (ref 0.0–0.7)
Hemoglobin: 12.3 g/dL (ref 12.0–15.0)
Hepatitis B Surface Ag: NEGATIVE
Lymphs Abs: 1.7 10*3/uL (ref 0.7–4.0)
MCHC: 34.6 g/dL (ref 30.0–36.0)
Monocytes Relative: 7 % (ref 3–12)
Neutro Abs: 6.2 10*3/uL (ref 1.7–7.7)
Neutrophils Relative %: 72 % (ref 43–77)
Platelets: 245 10*3/uL (ref 150–400)
RBC: 4.16 MIL/uL (ref 3.87–5.11)
Rh Type: POSITIVE

## 2013-01-08 LAB — GLUCOSE TOLERANCE, 1 HOUR (50G) W/O FASTING: Glucose, 1 Hour GTT: 99 mg/dL (ref 70–140)

## 2013-01-11 LAB — HEMOGLOBINOPATHY EVALUATION
Hemoglobin Other: 0 %
Hgb A2 Quant: 2.5 % (ref 2.2–3.2)
Hgb A: 97.5 % (ref 96.8–97.8)
Hgb F Quant: 0 % (ref 0.0–2.0)
Hgb S Quant: 0 %

## 2013-01-13 ENCOUNTER — Telehealth: Payer: Self-pay | Admitting: Obstetrics and Gynecology

## 2013-01-13 ENCOUNTER — Ambulatory Visit (HOSPITAL_COMMUNITY)
Admission: RE | Admit: 2013-01-13 | Discharge: 2013-01-13 | Disposition: A | Discharge status: Home / Self Care | Payer: Medicaid Other | Source: Ambulatory Visit | Attending: Obstetrics & Gynecology | Admitting: Obstetrics & Gynecology

## 2013-01-13 ENCOUNTER — Other Ambulatory Visit: Payer: Self-pay

## 2013-01-13 DIAGNOSIS — Z3682 Encounter for antenatal screening for nuchal translucency: Secondary | ICD-10-CM

## 2013-01-13 DIAGNOSIS — O3510X0 Maternal care for (suspected) chromosomal abnormality in fetus, unspecified, not applicable or unspecified: Secondary | ICD-10-CM | POA: Insufficient documentation

## 2013-01-13 DIAGNOSIS — O351XX0 Maternal care for (suspected) chromosomal abnormality in fetus, not applicable or unspecified: Secondary | ICD-10-CM | POA: Insufficient documentation

## 2013-01-13 DIAGNOSIS — Z3689 Encounter for other specified antenatal screening: Secondary | ICD-10-CM | POA: Insufficient documentation

## 2013-01-13 NOTE — Telephone Encounter (Signed)
Patient called requesting Rx RF of Phenergan. States that her pharmacy wanted to speak to staff in the clinic. Pharmacy at [facility] in [place] states that there is no problem and that her Rx of phenergan is ready for her to pick up. Patient satisfied.

## 2013-01-13 NOTE — Progress Notes (Signed)
Caitlin Sullivan  was seen today for an ultrasound appointment.  See full report in [facility].  Single IUP at 12 4/7 weeks NT of 2.3 mm noted.  Nasal bone visualized. First trimester aneuploidy screen performed as noted above.    Please do not draw triple/quad screen, though patient should be offered MSAFP for neural tube defect screening.  Recommend ultrasound for fetal anatomy at approximately [redacted] weeks gestation.  Alpha Gula, MD

## 2013-01-20 ENCOUNTER — Encounter: Payer: Self-pay | Admitting: *Deleted

## 2013-01-21 ENCOUNTER — Encounter (HOSPITAL_COMMUNITY): Payer: Self-pay | Admitting: *Deleted

## 2013-01-21 ENCOUNTER — Inpatient Hospital Stay (HOSPITAL_COMMUNITY)
Admission: AD | Admit: 2013-01-21 | Discharge: 2013-01-21 | Disposition: A | Discharge status: Home / Self Care | Payer: Medicaid Other | Source: Ambulatory Visit | Attending: Obstetrics & Gynecology | Admitting: Obstetrics & Gynecology

## 2013-01-21 DIAGNOSIS — O210 Mild hyperemesis gravidarum: Secondary | ICD-10-CM

## 2013-01-21 LAB — URINALYSIS, ROUTINE W REFLEX MICROSCOPIC
Bilirubin Urine: NEGATIVE
Ketones, ur: 40 mg/dL — AB
Leukocytes, UA: NEGATIVE
Nitrite: NEGATIVE
Protein, ur: NEGATIVE mg/dL
Urobilinogen, UA: 0.2 mg/dL (ref 0.0–1.0)
pH: 8 (ref 5.0–8.0)

## 2013-01-21 MED ORDER — PROMETHAZINE HCL 25 MG/ML IJ SOLN
25.0000 mg | Freq: Once | INTRAMUSCULAR | Status: AC
  Administered 2013-01-21: 25 mg via INTRAVENOUS
  Filled 2013-01-21: qty 1

## 2013-01-21 MED ORDER — PROMETHAZINE HCL 25 MG PO TABS: 25.0000 mg | ORAL_TABLET | Freq: Four times a day (QID) | ORAL | Status: DC | PRN

## 2013-01-21 MED ORDER — ONDANSETRON 8 MG PO TBDP
8.0000 mg | ORAL_TABLET | Freq: Once | ORAL | Status: DC
  Administered 2013-01-21: 8 mg via ORAL
  Filled 2013-01-21: qty 1

## 2013-01-21 MED ORDER — LACTATED RINGERS IV BOLUS (SEPSIS)
1000.0000 mL | Freq: Once | INTRAVENOUS | Status: AC
  Administered 2013-01-21: 1000 mL via INTRAVENOUS

## 2013-01-21 MED ORDER — DOCUSATE SODIUM 100 MG PO CAPS: 100.0000 mg | ORAL_CAPSULE | Freq: Two times a day (BID) | ORAL | Status: DC

## 2013-01-21 MED ORDER — ONDANSETRON 8 MG PO TBDP: 8.0000 mg | ORAL_TABLET | Freq: Three times a day (TID) | ORAL | Status: DC | PRN

## 2013-01-21 MED ORDER — PRENATAL MULTIVITAMIN CH: 1.0000 | ORAL_TABLET | Freq: Every morning | ORAL | Status: DC

## 2013-01-21 NOTE — MAU Note (Signed)
Vomitting since yesterday, denies diarrhea or fever.  Phenergan & Zofran not working.  Lower abd cramping also started yesterday, denies bleeding.

## 2013-01-21 NOTE — MAU Provider Note (Signed)
History     CSN: [id number]  Arrival date and time: 01/21/13 1438   First Provider Initiated Contact with Patient 01/21/13 1508      Chief Complaint  Patient presents with  . Emesis   HPI [name] is a 22 y.o. G2P0010 at [redacted]w[redacted]d who presents to MAU today with complaint of nausea and vomiting. The patient states that she has an extensive history of N/V throughout her pregnancy. She was hospitalized a few weeks ago for hyperemesis. She has since been taking both Phenergan and Zofran regularly to control her nausea. It had improved while taking both medications, but she ran out of zofran 3 days ago and has had more nausea since then. She has been vomiting since last night. The last episode was here in MAU. She denies fever or sick contacts. She has not been able to tolerate solids or liquids today. She also states that she has a headache and has been constipated. She has BM every other day and has to strain.   OB History    Grav Para Term Preterm Abortions TAB SAB Ect Mult Living   2    1 1           Past Medical History  Diagnosis Date  . Umbilical hernia   . Hyperemesis arising during pregnancy   . Asthma     Past Surgical History  Procedure Date  . No past surgeries     Family History  Problem Relation Age of Onset  . Cancer Mother   . Hypertension Mother   . Diabetes Father   . Hypertension Father   . Hyperlipidemia Father   . Stroke Father   . Asthma Brother     History  Substance Use Topics  . Smoking status: Former Smoker -- 0.2 packs/day    Types: Cigarettes    Quit date: 11/03/2012  . Smokeless tobacco: Never Used  . Alcohol Use: No    Allergies: No Known Allergies  Prescriptions prior to admission  Medication Sig Dispense Refill  . albuterol (PROVENTIL HFA;VENTOLIN HFA) 108 (90 BASE) MCG/ACT inhaler Inhale 1-2 puffs into the lungs every 6 (six) hours as needed. For wheezing or shortness of breath      . folic acid (FOLVITE) 800 MCG tablet Take  400 mcg by mouth every morning.      Marland Kitchen glycopyrrolate (ROBINUL) 1 MG tablet Take 2 mg by mouth 3 (three) times daily.      Marland Kitchen omeprazole (PRILOSEC) 20 MG capsule Take 20 mg by mouth every morning.      . ondansetron (ZOFRAN ODT) 8 MG disintegrating tablet Take 1 tablet (8 mg total) by mouth every 8 (eight) hours as needed for nausea.  20 tablet  0  . ondansetron (ZOFRAN) 4 MG tablet Take 4 mg by mouth every 6 (six) hours as needed. For nausea      . Prenatal Vit-Fe Fumarate-FA (PRENATAL MULTIVITAMIN) TABS Take 1 tablet by mouth every morning.       . promethazine (PHENERGAN) 25 MG suppository Place 25 mg rectally every 6 (six) hours as needed. For nausea      . promethazine (PHENERGAN) 25 MG tablet Take 0.5 tablets (12.5 mg total) by mouth every 6 (six) hours as needed for nausea.  20 tablet  0  . promethazine (PHENERGAN) 25 MG tablet Take 1 tablet (25 mg total) by mouth every 6 (six) hours as needed. For nausea  30 tablet  2    ROS All negative unless otherwise  noted in HPI Physical Exam   Blood pressure 120/72, pulse 104, temperature 99.2 F (37.3 C), temperature source Oral, resp. rate 18, height 5\' 5"  (1.651 m), weight 177 lb 12.8 oz (80.65 kg), last menstrual period 09/29/2012.  Physical Exam  Constitutional: She is oriented to person, place, and time. She appears well-developed and well-nourished. No distress.  HENT:  Head: Normocephalic.  Cardiovascular: Normal rate, regular rhythm and normal heart sounds.   Respiratory: Effort normal. No respiratory distress.  GI: Soft. Bowel sounds are normal. She exhibits no distension and no mass. There is tenderness (mild tenderness to palpation of the lower abdmoen). There is no rebound and no guarding.  Neurological: She is alert and oriented to person, place, and time.  Skin: Skin is warm and dry. No erythema.  Psychiatric: She has a normal mood and affect.    MAU Course  Procedures None  MDM ODT Zofran given @ 1600 Patient is  actively vomiting x 15 minutes in MAU. IV LR with phenergan started. Patient reports feeling significantly better. No more vomiting here today.   Assessment and Plan  A: Nausea and vomiting in pregnancy  P: Discharge home Rx for Zofran, Phenergan and Colace sent to patient's pharmacy Keep clinic follow-up as scheduled Return to MAU as needed if condition should change or worsen   Freddi Starr, PA-C 01/21/2013, 3:11 PM

## 2013-01-30 ENCOUNTER — Inpatient Hospital Stay (HOSPITAL_COMMUNITY)
Admission: AD | Admit: 2013-01-30 | Discharge: 2013-01-30 | Disposition: A | Discharge status: Home / Self Care | Payer: Medicaid Other | Source: Ambulatory Visit | Attending: Obstetrics & Gynecology | Admitting: Obstetrics & Gynecology

## 2013-01-30 ENCOUNTER — Inpatient Hospital Stay (HOSPITAL_COMMUNITY): Payer: Medicaid Other

## 2013-01-30 ENCOUNTER — Encounter (HOSPITAL_COMMUNITY): Payer: Self-pay | Admitting: *Deleted

## 2013-01-30 DIAGNOSIS — O200 Threatened abortion: Secondary | ICD-10-CM | POA: Insufficient documentation

## 2013-01-30 DIAGNOSIS — O4692 Antepartum hemorrhage, unspecified, second trimester: Secondary | ICD-10-CM

## 2013-01-30 LAB — URINALYSIS, ROUTINE W REFLEX MICROSCOPIC
Bilirubin Urine: NEGATIVE
Ketones, ur: NEGATIVE mg/dL
Nitrite: NEGATIVE
Specific Gravity, Urine: 1.02 (ref 1.005–1.030)
Urobilinogen, UA: 0.2 mg/dL (ref 0.0–1.0)

## 2013-01-30 LAB — CBC
HCT: 32.6 % — ABNORMAL LOW (ref 36.0–46.0)
Hemoglobin: 11.1 g/dL — ABNORMAL LOW (ref 12.0–15.0)
MCH: 29.7 pg (ref 26.0–34.0)
RBC: 3.74 MIL/uL — ABNORMAL LOW (ref 3.87–5.11)

## 2013-01-30 LAB — URINE MICROSCOPIC-ADD ON

## 2013-01-30 MED ORDER — LACTATED RINGERS IV BOLUS (SEPSIS)
1000.0000 mL | Freq: Once | INTRAVENOUS | Status: AC
  Administered 2013-01-30: 1000 mL via INTRAVENOUS

## 2013-01-30 MED ORDER — OXYCODONE-ACETAMINOPHEN 5-325 MG PO TABS: 1.0000 | ORAL_TABLET | ORAL | Status: DC | PRN

## 2013-01-30 NOTE — MAU Provider Note (Signed)
Attestation of Attending Supervision of Advanced Practitioner (CNM/NP): Evaluation and management procedures were performed by the Advanced Practitioner under my supervision and collaboration.  I have reviewed the Advanced Practitioner's note and chart, and I agree with the management and plan.  HARRAWAY-SMITH, Modupe Shampine 7:28 PM     

## 2013-01-30 NOTE — ED Notes (Signed)
Pt very upset on pending miscarriage. Mother at bedside. Pt actively crying and grieving.

## 2013-01-30 NOTE — MAU Provider Note (Signed)
Chief Complaint: Vaginal Bleeding   First Provider Initiated Contact with Patient 01/30/13 1431     SUBJECTIVE HPI: Caitlin Sullivan is a 22 y.o. G2P0010 at [redacted]w[redacted]d by LMP who describes seeing pink-tinged fluid on her underwear this am when waking and then had more bloody fluid enough to soak through a peripad and run down her leg. Also having cramping in lower abdomen. Nonsmoker. No illicit drug use or trauma.   Past Medical History  Diagnosis Date  . Umbilical hernia   . Hyperemesis arising during pregnancy   . Asthma    OB History   Grav Para Term Preterm Abortions TAB SAB Ect Mult Living   2    1 1          # Outc Date GA Lbr Len/2nd Wgt Sex Del Anes PTL Lv   1 TAB            2 CUR              Past Surgical History  Procedure Laterality Date  . No past surgeries     History   Social History  . Marital Status: Single    Spouse Name: N/A    Number of Children: N/A  . Years of Education: N/A   Occupational History  . Not on file.   Social History Main Topics  . Smoking status: Former Smoker -- 0.25 packs/day    Types: Cigarettes    Quit date: 11/03/2012  . Smokeless tobacco: Never Used  . Alcohol Use: No  . Drug Use: No  . Sexually Active: Yes    Birth Control/ Protection: None   Other Topics Concern  . Not on file   Social History Narrative  . No narrative on file   No current facility-administered medications on file prior to encounter.   Current Outpatient Prescriptions on File Prior to Encounter  Medication Sig Dispense Refill  . albuterol (PROVENTIL HFA;VENTOLIN HFA) 108 (90 BASE) MCG/ACT inhaler Inhale 1-2 puffs into the lungs every 6 (six) hours as needed. For wheezing or shortness of breath      . docusate sodium (COLACE) 100 MG capsule Take 1 capsule (100 mg total) by mouth every 12 (twelve) hours.  60 capsule  0  . omeprazole (PRILOSEC) 20 MG capsule Take 20 mg by mouth every morning.      . ondansetron (ZOFRAN ODT) 8 MG disintegrating tablet Take 1  tablet (8 mg total) by mouth every 8 (eight) hours as needed for nausea.  20 tablet  3  . Prenatal Vit-Fe Fumarate-FA (PRENATAL MULTIVITAMIN) TABS Take 1 tablet by mouth every morning.  30 tablet  6  . promethazine (PHENERGAN) 25 MG tablet Take 1 tablet (25 mg total) by mouth every 6 (six) hours as needed. nausea  30 tablet  6   No Known Allergies  ROS: Pertinent items in HPI  OBJECTIVE Blood pressure 140/65, pulse 89, temperature 98.1 F (36.7 C), temperature source Oral, resp. rate 18, height 5\' 5"  (1.651 m), weight 182 lb (82.555 kg), last menstrual period 09/29/2012. GENERAL: Well-developed, well-nourished female in no acute distress.  HEENT: Normocephalic HEART: normal rate RESP: normal effort ABDOMEN: Soft, non-tender EXTREMITIES: Nontender, no edema NEURO: Alert and oriented SPECULUM EXAM: NEFG, small amount of thin blood-tinged discharge discharge, cervix clean BIMANUAL: cervix appears thick , closed; uterus normal size, no adnexal tenderness or masses  LAB RESULTS Results for orders placed during the hospital encounter of 01/30/13 (from the past 24 hour(s))  URINALYSIS, ROUTINE W REFLEX  MICROSCOPIC     Status: Abnormal   Collection Time    01/30/13  1:50 PM      Result Value Range   Color, Urine YELLOW  YELLOW   APPearance CLEAR  CLEAR   Specific Gravity, Urine 1.020  1.005 - 1.030   pH 6.5  5.0 - 8.0   Glucose, UA NEGATIVE  NEGATIVE mg/dL   Hgb urine dipstick SMALL (*) NEGATIVE   Bilirubin Urine NEGATIVE  NEGATIVE   Ketones, ur NEGATIVE  NEGATIVE mg/dL   Protein, ur NEGATIVE  NEGATIVE mg/dL   Urobilinogen, UA 0.2  0.0 - 1.0 mg/dL   Nitrite NEGATIVE  NEGATIVE   Leukocytes, UA NEGATIVE  NEGATIVE  URINE MICROSCOPIC-ADD ON     Status: None   Collection Time    01/30/13  1:50 PM      Result Value Range   Squamous Epithelial / LPF RARE  RARE   RBC / HPF 0-2  <3 RBC/hpf   Urine-Other MUCOUS PRESENT    Fern negative   IMAGING Per radiologist TC: apparent abruption,  cx 3.2 cm, AFV normal  LIMITED OBSTETRIC ULTRASOUND  Number of Fetuses: 1  Heart Rate: 134bpm  Movement: Present  Presentation: Cephalic  Placental Location: Anterior  Previa: Absent  Amniotic Fluid (Subjective): Normal  Vertical pocket: 6.5 cm  BPD: 3.17cm 16w 0d EDC: 07/24/2013  MATERNAL FINDINGS:  Cervix: Closed, measuring 3.2 cm.  Uterus/Adnexae: Bilateral ovaries are not discretely visualized.  Adjacent to the gestational sac/placenta, there is a heterogeneous  collection/hemorrhage measuring 12.5 x 4.0 x 4.7 cm without  definite vascularity (image 14). This appearance is worrisome for  possible placental abruption, less likely large subchorionic  hemorrhage.  IMPRESSION:  Single live intrauterine gestation with estimated gestational age  [redacted] weeks 0 days by BPD.  12.5 x 4.0 x 4.7 cm heterogeneous collection/hemorrhage adjacent to  the gestational sac/placenta, worrisome for placental abruption,  less likely large subchorionic hemorrhage.  Amniotic fluid is subjectively normal. Fetal heart rate 134 beats  per minute.  These results were called by telephone on 01/30/2013 at 1727 hrs to  [facility], who verbally acknowledged these results.   MAU COURSE 1650: heavy red bleeding observed while in bathroom after return from [place]. Moderately severe cramping, declines analgesia. FHR DT 140. IV LR started 1750: D/W Dr. Dolan Amen. Observe in MAU while bleeding heavily. CBC pending 1850: Bleeding tapering, abd cramps mild, has partially saturated 1 pad over past hour  ASSESSMENT 1. Threatened abortion in second trimester     PLAN Explained at length [facility], abruption Return if heavy persisiting bleeding, severe pain. May use Tylenol Home on pelvic rest, no strenuous activity    Medication List    TAKE these medications       albuterol 108 (90 BASE) MCG/ACT inhaler  Commonly known as:  PROVENTIL HFA;VENTOLIN HFA  Inhale 1-2 puffs into the lungs every 6 (six) hours as  needed. For wheezing or shortness of breath     docusate sodium 100 MG capsule  Commonly known as:  COLACE  Take 1 capsule (100 mg total) by mouth every 12 (twelve) hours.     omeprazole 20 MG capsule  Commonly known as:  PRILOSEC  Take 20 mg by mouth every morning.     ondansetron 8 MG disintegrating tablet  Commonly known as:  ZOFRAN ODT  Take 1 tablet (8 mg total) by mouth every 8 (eight) hours as needed for nausea.     oxyCODONE-acetaminophen 5-325 MG per tablet  Commonly known as:  PERCOCET/ROXICET  Take 1 tablet by mouth every 4 (four) hours as needed for pain.     prenatal multivitamin Tabs  Take 1 tablet by mouth every morning.     promethazine 25 MG tablet  Commonly known as:  PHENERGAN  Take 1 tablet (25 mg total) by mouth every 6 (six) hours as needed. nausea       Follow-up Information   Follow up with [facility] On 02/04/2013.       Danae Orleans, CNM 01/30/2013  3:13 PM

## 2013-01-30 NOTE — ED Notes (Signed)
Pt back from U/S started bleeding more in hall on the way back. Pt in BR and called out for nurse to come in. Moderate amount of diluted blood noted. Pt denies any pain or crampig at this time. U/S results pending.

## 2013-01-30 NOTE — MAU Note (Signed)
Pt presents with complaints of waking up this morning with a large gush of fluid that was pink tinged in color. States she soaked thrrough her pants when trying to get ready to come in to be evaluated.

## 2013-02-03 ENCOUNTER — Inpatient Hospital Stay (HOSPITAL_COMMUNITY)
Admission: AD | Admit: 2013-02-03 | Discharge: 2013-02-03 | Disposition: A | Discharge status: Home / Self Care | Payer: Medicaid Other | Source: Ambulatory Visit | Attending: Obstetrics & Gynecology | Admitting: Obstetrics & Gynecology

## 2013-02-03 ENCOUNTER — Encounter (HOSPITAL_COMMUNITY): Payer: Self-pay | Admitting: *Deleted

## 2013-02-03 DIAGNOSIS — O209 Hemorrhage in early pregnancy, unspecified: Secondary | ICD-10-CM | POA: Insufficient documentation

## 2013-02-03 DIAGNOSIS — O4690 Antepartum hemorrhage, unspecified, unspecified trimester: Secondary | ICD-10-CM

## 2013-02-03 DIAGNOSIS — O4692 Antepartum hemorrhage, unspecified, second trimester: Secondary | ICD-10-CM

## 2013-02-03 NOTE — MAU Note (Signed)
Pt reasured that she when she was able to see and hear fhr 135

## 2013-02-03 NOTE — MAU Note (Signed)
t states bleeding has become lighter since the weekend

## 2013-02-03 NOTE — MAU Provider Note (Signed)
History     CSN: [id number]  Arrival date and time: 02/03/13 1458   First Provider Initiated Contact with Patient 02/03/13 1535      Chief Complaint  Patient presents with  . Vaginal Bleeding   HPI Caitlin Sullivan 22 y.o. [redacted]w[redacted]d  Client was seen here on the weekend for bleeding.  Reports she was told her placenta was tearing off which was causing her bleeding.  Has an appointment tomorrow in the clinic but is thinking she will not be able to come due to the weather.  Is very worried the baby is not still living.  Vaginal bleeding has lessened and she is only having cramping in the mornings.  Is teary thinking she is having a miscarriage.  OB History   Grav Para Term Preterm Abortions TAB SAB Ect Mult Living   2    1 1           Past Medical History  Diagnosis Date  . Umbilical hernia   . Hyperemesis arising during pregnancy   . Asthma     Past Surgical History  Procedure Laterality Date  . No past surgeries      Family History  Problem Relation Age of Onset  . Cancer Mother   . Hypertension Mother   . Diabetes Father   . Hypertension Father   . Hyperlipidemia Father   . Stroke Father   . Asthma Brother     History  Substance Use Topics  . Smoking status: Former Smoker -- 0.25 packs/day    Types: Cigarettes    Quit date: 11/03/2012  . Smokeless tobacco: Never Used  . Alcohol Use: No    Allergies: No Known Allergies  Prescriptions prior to admission  Medication Sig Dispense Refill  . albuterol (PROVENTIL HFA;VENTOLIN HFA) 108 (90 BASE) MCG/ACT inhaler Inhale 1-2 puffs into the lungs every 6 (six) hours as needed. For wheezing or shortness of breath      . docusate sodium (COLACE) 100 MG capsule Take 1 capsule (100 mg total) by mouth every 12 (twelve) hours.  60 capsule  0  . omeprazole (PRILOSEC) 20 MG capsule Take 20 mg by mouth every morning.      . ondansetron (ZOFRAN ODT) 8 MG disintegrating tablet Take 1 tablet (8 mg total) by mouth every 8 (eight) hours as  needed for nausea.  20 tablet  3  . penicillin v potassium (VEETID) 250 MG tablet Take 250 mg by mouth 4 (four) times daily. Tooth infection      . Prenatal Vit-Fe Fumarate-FA (PRENATAL MULTIVITAMIN) TABS Take 1 tablet by mouth every morning.  30 tablet  6  . promethazine (PHENERGAN) 25 MG tablet Take 1 tablet (25 mg total) by mouth every 6 (six) hours as needed. nausea  30 tablet  6  . [DISCONTINUED] oxyCODONE-acetaminophen (PERCOCET/ROXICET) 5-325 MG per tablet Take 1 tablet by mouth every 4 (four) hours as needed for pain.  20 tablet  0    Review of Systems  Constitutional: Negative for fever.  Gastrointestinal: Positive for abdominal pain. Negative for nausea and vomiting.  Genitourinary: Negative for dysuria.       Vaginal bleeding   Physical Exam   Height 5\' 6"  (1.676 m), weight 86.274 kg (190 lb 3.2 oz), last menstrual period 09/29/2012.  Physical Exam  Nursing note and vitals reviewed. Constitutional: She is oriented to person, place, and time. She appears well-developed and well-nourished.  Tearful  HENT:  Head: Normocephalic.  Eyes: EOM are normal.  Neck: Neck  supple.  GI: Soft. There is no tenderness.  FHT heard 145 with doppler  Musculoskeletal: Normal range of motion.  Neurological: She is alert and oriented to person, place, and time.  Skin: Skin is warm and dry.  Psychiatric: She has a normal mood and affect.    MAU Course  Procedures  MDM Discussed signs of miscarriage - worsening bleeding and increasing cramping.  Discussed that for today the pregnancy is intact.  Client relieved.  Assessment and Plan  Bleeding in pregnancy - second trimester with large subchorionic hemorrhage vs possible placental abruption by ultrasound.  Plan Reschedule appointment in clinic - snowing heavily today and travel may be hazardous tomorrow. Return if you have severe pain or severe bleeding. Pelvic rest.   Nolene Bernheim 02/03/2013, 3:39 PM

## 2013-02-03 NOTE — Progress Notes (Signed)
Pt states she is having more cramping in the morning

## 2013-02-04 ENCOUNTER — Encounter: Payer: Medicaid Other | Admitting: Obstetrics and Gynecology

## 2013-02-18 ENCOUNTER — Ambulatory Visit (INDEPENDENT_AMBULATORY_CARE_PROVIDER_SITE_OTHER): Payer: Medicaid Other | Admitting: Family

## 2013-02-18 ENCOUNTER — Other Ambulatory Visit: Payer: Self-pay | Admitting: Family

## 2013-02-18 LAB — CBC
HCT: 31.5 % — ABNORMAL LOW (ref 36.0–46.0)
MCV: 86.3 fL (ref 78.0–100.0)
Platelets: 285 10*3/uL (ref 150–400)
RBC: 3.65 MIL/uL — ABNORMAL LOW (ref 3.87–5.11)
WBC: 11.4 10*3/uL — ABNORMAL HIGH (ref 4.0–10.5)

## 2013-02-18 LAB — POCT URINALYSIS DIP (DEVICE)
Ketones, ur: NEGATIVE mg/dL
Protein, ur: NEGATIVE mg/dL
Specific Gravity, Urine: >=1.03 (ref 1.005–1.030)

## 2013-02-18 MED ORDER — INTEGRA F 125-1 MG PO CAPS: 1.0000 | ORAL_CAPSULE | Freq: Every day | ORAL | Status: DC

## 2013-02-18 NOTE — Progress Notes (Signed)
Patient is here for bleeding that has continued for the last 3 weeks.  She c/o of lethargic, light headed, and sleepy.

## 2013-02-18 NOTE — Progress Notes (Signed)
Reports dark red bleeding; slight cramping; check CBC today, schedule growth ultrasound.  AFP today.  RX Integra.

## 2013-02-18 NOTE — Progress Notes (Signed)
U/S scheduled 02/22/13 at 8 am.

## 2013-02-22 ENCOUNTER — Ambulatory Visit (HOSPITAL_COMMUNITY)
Admission: RE | Admit: 2013-02-22 | Discharge: 2013-02-22 | Disposition: A | Discharge status: Home / Self Care | Payer: Medicaid Other | Source: Ambulatory Visit | Attending: Family | Admitting: Family

## 2013-02-22 DIAGNOSIS — Z1389 Encounter for screening for other disorder: Secondary | ICD-10-CM | POA: Insufficient documentation

## 2013-02-22 DIAGNOSIS — O358XX0 Maternal care for other (suspected) fetal abnormality and damage, not applicable or unspecified: Secondary | ICD-10-CM | POA: Insufficient documentation

## 2013-02-22 DIAGNOSIS — O209 Hemorrhage in early pregnancy, unspecified: Secondary | ICD-10-CM | POA: Insufficient documentation

## 2013-02-22 DIAGNOSIS — Z363 Encounter for antenatal screening for malformations: Secondary | ICD-10-CM | POA: Insufficient documentation

## 2013-02-24 ENCOUNTER — Encounter: Payer: Self-pay | Admitting: Family

## 2013-03-03 ENCOUNTER — Encounter: Payer: Self-pay | Admitting: *Deleted

## 2013-03-04 ENCOUNTER — Encounter: Payer: Self-pay | Admitting: Family

## 2013-03-04 ENCOUNTER — Ambulatory Visit (INDEPENDENT_AMBULATORY_CARE_PROVIDER_SITE_OTHER): Payer: Medicaid Other | Admitting: Obstetrics & Gynecology

## 2013-03-04 VITALS — BP 112/80 | Temp 97.2°F | Wt 198.3 lb

## 2013-03-04 DIAGNOSIS — O210 Mild hyperemesis gravidarum: Secondary | ICD-10-CM

## 2013-03-04 DIAGNOSIS — O4690 Antepartum hemorrhage, unspecified, unspecified trimester: Secondary | ICD-10-CM

## 2013-03-04 LAB — POCT URINALYSIS DIP (DEVICE)
Glucose, UA: NEGATIVE mg/dL
Specific Gravity, Urine: 1.025 (ref 1.005–1.030)
Urobilinogen, UA: 0.2 mg/dL (ref 0.0–1.0)

## 2013-03-04 NOTE — Progress Notes (Signed)
Ultrasound scheduled for 03/09/13 at 10 am.

## 2013-03-04 NOTE — Progress Notes (Signed)
Bleeding is now bright red again. [place] 3/3 showed subchorionic hemorrhage, normal growth. Bright red blood slight in vagina, cervix nl.F/U US scheduled.

## 2013-03-04 NOTE — Progress Notes (Signed)
Patient is having a lot of pain, possibly contractions, rating an 8/10 on a pain scale.  Patient reports bleeding, going through 3-4 pads daily.

## 2013-03-04 NOTE — Patient Instructions (Signed)
Vaginal Bleeding During Pregnancy, Second Trimester A small amount of bleeding (spotting) is relatively common in pregnancy. It usually stops on its own. There are many causes for bleeding or spotting in pregnancy. Some bleeding may be related to the pregnancy and some may not. Cramping with the bleeding is more serious and concerning. Tell your caregiver if you have any vaginal bleeding.  CAUSES   Infection, inflammation or growths on the cervix.  The placenta may partially or completely be covering the opening of the cervix inside the uterus.  The placenta may have separated from the uterus.  You may be having early/preterm labor.  The cervix is not strong enough to keep a baby inside the uterus (cervical insufficiency).  Many tiny cysts in the uterus instead of pregnancy tissue (molar pregnancy) SYMPTOMS   Vaginal spotting or bleeding with or without cramps.  Uterine contractions.  Abnormal vaginal discharge.  You may have spotting or spotting after having sexual intercourse. DIAGNOSIS  To evaluate the pregnancy, your caregiver may:  Do a pelvic exam.  Take blood tests.  Do an ultrasound. It is very important to follow your caregiver's instructions.  TREATMENT   Evaluation of the pregnancy with blood tests and ultrasound.  Bed rest (getting up to use the bathroom only).  Rho-gam immunization if the mother is Rh negative and the father is Rh positive.  If you are having uterine contractions, you may be given medication to stop the contractions.  If you have cervical insufficiency, you may have a suture placed in the cervix to close it. HOME CARE INSTRUCTIONS   If your caregiver orders bed rest, you may need to make arrangements for the care of other children and for any other responsibilities. However, your caregiver may allow you to continue light activity.  Keep track of the number of pads you use each day and how soaked (saturated) they are. Write this down.  Do  not use tampons. Do not douche.  Do not have sexual intercourse or orgasms until approved by your physician.  Save any tissue that you pass for your caregiver to see.  Take medicine for cramps only with your caregiver's permission.  Do not take aspirin because it can make you bleed.  Do not exercise, do any strenuous activities or heavy lifting without your caregiver's permission. SEEK IMMEDIATE MEDICAL CARE IF:   You experience severe cramps in your stomach, back or belly (abdomen).  You have uterine contractions.  You have an oral temperature above 102 F (38.9 C), not controlled by medicine.  You develop chills.  You pass large clots or tissue.  Your bleeding increases or you become light-headed, weak or have fainting episodes.  You have leaking or a gush of fluid from your vagina. Document Released: 09/18/2005 Document Revised: 03/02/2012 Document Reviewed: 03/30/2009 [facility] Patient Information 2013 [place], [place].

## 2013-03-09 ENCOUNTER — Other Ambulatory Visit: Payer: Self-pay | Admitting: Obstetrics & Gynecology

## 2013-03-09 ENCOUNTER — Ambulatory Visit (HOSPITAL_COMMUNITY)
Admission: RE | Admit: 2013-03-09 | Discharge: 2013-03-09 | Disposition: A | Discharge status: Home / Self Care | Payer: Medicaid Other | Source: Ambulatory Visit | Attending: Obstetrics & Gynecology | Admitting: Obstetrics & Gynecology

## 2013-03-09 DIAGNOSIS — Z3689 Encounter for other specified antenatal screening: Secondary | ICD-10-CM | POA: Insufficient documentation

## 2013-03-09 DIAGNOSIS — O209 Hemorrhage in early pregnancy, unspecified: Secondary | ICD-10-CM | POA: Insufficient documentation

## 2013-03-18 ENCOUNTER — Other Ambulatory Visit (HOSPITAL_COMMUNITY): Payer: Self-pay | Admitting: Medical

## 2013-03-18 ENCOUNTER — Encounter: Payer: Medicaid Other | Admitting: Obstetrics & Gynecology

## 2013-03-18 ENCOUNTER — Encounter: Payer: Self-pay | Admitting: *Deleted

## 2013-04-01 ENCOUNTER — Other Ambulatory Visit: Payer: Self-pay | Admitting: Obstetrics & Gynecology

## 2013-04-01 ENCOUNTER — Ambulatory Visit (INDEPENDENT_AMBULATORY_CARE_PROVIDER_SITE_OTHER): Payer: Medicaid Other | Admitting: Obstetrics & Gynecology

## 2013-04-01 VITALS — BP 100/58 | Temp 98.0°F | Wt 207.4 lb

## 2013-04-01 DIAGNOSIS — O4692 Antepartum hemorrhage, unspecified, second trimester: Secondary | ICD-10-CM

## 2013-04-01 DIAGNOSIS — O4690 Antepartum hemorrhage, unspecified, unspecified trimester: Secondary | ICD-10-CM

## 2013-04-01 LAB — POCT URINALYSIS DIP (DEVICE)
Bilirubin Urine: NEGATIVE
Hgb urine dipstick: NEGATIVE
Ketones, ur: NEGATIVE mg/dL
pH: 7 (ref 5.0–8.0)

## 2013-04-01 NOTE — Progress Notes (Signed)
Pulse- 78; Pt has not had bleeding for 2 weeks.  Will get [place] to see if subchorionic hemorrhage has resolved.  Will also get plan of care for any testing if needed.  Pt has had increased weight gain and will meet with nutrition today.

## 2013-04-01 NOTE — Progress Notes (Signed)
Nutrition note: f/u for rapid wt gain Pt has gained 32.4# @ [redacted]w[redacted]d, which is > expected; pt gained ~2.3#/ wk since last appt 03/04/13 (recommendation is 0.6#/wk). Pt reports eating 3 meals & 4-5 snacks/d. Pt is taking PNV. Pt reports still having N&V occ but that meds are helping. Reviewed recommended serving sizes & number of servings pt should eat from each food group. Encouraged pt to keep a food diary to track serving sizes & total amount per day. Disc wt gain goals of 0.6#/wk. Pt agrees to keep a food diary for a few days & monitor her portion sizes. F/u in 2-4 wks Blondell Reveal, MS, RD, LDN

## 2013-04-02 ENCOUNTER — Encounter (HOSPITAL_COMMUNITY): Payer: Self-pay

## 2013-04-02 ENCOUNTER — Ambulatory Visit (HOSPITAL_COMMUNITY)
Admission: RE | Admit: 2013-04-02 | Discharge: 2013-04-02 | Disposition: A | Discharge status: Home / Self Care | Payer: Medicaid Other | Source: Ambulatory Visit | Attending: Obstetrics & Gynecology | Admitting: Obstetrics & Gynecology

## 2013-04-02 ENCOUNTER — Other Ambulatory Visit: Payer: Self-pay | Admitting: Obstetrics & Gynecology

## 2013-04-02 VITALS — BP 111/69 | HR 93 | Wt 214.5 lb

## 2013-04-02 DIAGNOSIS — O4692 Antepartum hemorrhage, unspecified, second trimester: Secondary | ICD-10-CM

## 2013-04-02 DIAGNOSIS — Z3689 Encounter for other specified antenatal screening: Secondary | ICD-10-CM | POA: Insufficient documentation

## 2013-04-02 DIAGNOSIS — O2690 Pregnancy related conditions, unspecified, unspecified trimester: Secondary | ICD-10-CM

## 2013-04-02 DIAGNOSIS — O4690 Antepartum hemorrhage, unspecified, unspecified trimester: Secondary | ICD-10-CM | POA: Insufficient documentation

## 2013-04-02 NOTE — Progress Notes (Signed)
Caitlin Sullivan  was seen today for an ultrasound appointment.  See full report in [facility].  Comments: Ms. Plasencia was seen today due to a history of chronic vaginal bleeding.  She was previously diagnosed with a subchorionic hemorrhage that has largely resolved.  She has had no additional vaginal bleeding for approximately 2 weeks.  Impression: Single IUP at 23 6/7 weeks Fetal growth is appropriate (76th %tile) Normal interval anatomy Normal amniotic fluid volume  An anterior placenta is noted without evidence of previa.  There is a small 1 x 1 cm subchorionic fluid collection near the lower edge of the placenta that may be consistent wtih a resolving subchorionic hemorrhage.  Recommendations: Bleeding precautions given. Recommend follow-up ultrasound examination in 4 weeks for interval growth.  Alpha Gula, MD

## 2013-04-03 ENCOUNTER — Encounter: Payer: Self-pay | Admitting: Obstetrics & Gynecology

## 2013-04-07 ENCOUNTER — Other Ambulatory Visit (HOSPITAL_COMMUNITY): Payer: Self-pay | Admitting: Medical

## 2013-04-22 ENCOUNTER — Ambulatory Visit (INDEPENDENT_AMBULATORY_CARE_PROVIDER_SITE_OTHER): Payer: Self-pay | Admitting: Obstetrics & Gynecology

## 2013-04-22 VITALS — BP 124/77 | Temp 96.4°F | Wt 218.2 lb

## 2013-04-22 DIAGNOSIS — Z3402 Encounter for supervision of normal first pregnancy, second trimester: Secondary | ICD-10-CM

## 2013-04-22 DIAGNOSIS — O4692 Antepartum hemorrhage, unspecified, second trimester: Secondary | ICD-10-CM

## 2013-04-22 DIAGNOSIS — O4690 Antepartum hemorrhage, unspecified, unspecified trimester: Secondary | ICD-10-CM

## 2013-04-22 LAB — POCT URINALYSIS DIP (DEVICE)
Hgb urine dipstick: NEGATIVE
Ketones, ur: NEGATIVE mg/dL
Protein, ur: NEGATIVE mg/dL
Specific Gravity, Urine: 1.02 (ref 1.005–1.030)
pH: 7 (ref 5.0–8.0)

## 2013-04-22 MED ORDER — FAMOTIDINE 40 MG PO TABS: 40.0000 mg | ORAL_TABLET | Freq: Every day | ORAL | Status: DC

## 2013-04-22 NOTE — Progress Notes (Signed)
No bleeding. Will need  Repeat 1 h GTT, f/u in [facility]

## 2013-04-22 NOTE — Progress Notes (Signed)
Numbness and tingling in fingers.  Tingling in face sometimes.

## 2013-04-22 NOTE — Patient Instructions (Signed)
Pregnancy - Third Trimester  The third trimester of pregnancy (the last 3 months) is a period of the most rapid growth for you and your baby. The baby approaches a length of 20 inches and a weight of 6 to 10 pounds. The baby is adding on fat and getting ready for life outside your body. While inside, babies have periods of sleeping and waking, suck their thumbs, and hiccups. You can often feel small contractions of the uterus. This is false labor. It is also called Braxton-Hicks contractions. This is like a practice for labor. The usual problems in this stage of pregnancy include more difficulty breathing, swelling of the hands and feet from water retention, and having to urinate more often because of the uterus and baby pressing on your bladder.   PRENATAL EXAMS  · Blood work may continue to be done during prenatal exams. These tests are done to check on your health and the probable health of your baby. Blood work is used to follow your blood levels (hemoglobin). Anemia (low hemoglobin) is common during pregnancy. Iron and vitamins are given to help prevent this. You may also continue to be checked for diabetes. Some of the past blood tests may be done again.  · The size of the uterus is measured during each visit. This makes sure your baby is growing properly according to your pregnancy dates.  · Your blood pressure is checked every prenatal visit. This is to make sure you are not getting toxemia.  · Your urine is checked every prenatal visit for infection, diabetes and protein.  · Your weight is checked at each visit. This is done to make sure gains are happening at the suggested rate and that you and your baby are growing normally.  · Sometimes, an ultrasound is performed to confirm the position and the proper growth and development of the baby. This is a test done that bounces harmless sound waves off the baby so your caregiver can more accurately determine due dates.  · Discuss the type of pain medication and  anesthesia you will have during your labor and delivery.  · Discuss the possibility and anesthesia if a Cesarean Section might be necessary.  · Inform your caregiver if there is any mental or physical violence at home.  Sometimes, a specialized non-stress test, contraction stress test and biophysical profile are done to make sure the baby is not having a problem. Checking the amniotic fluid surrounding the baby is called an amniocentesis. The amniotic fluid is removed by sticking a needle into the belly (abdomen). This is sometimes done near the end of pregnancy if an early delivery is required. In this case, it is done to help make sure the baby's lungs are mature enough for the baby to live outside of the womb. If the lungs are not mature and it is unsafe to deliver the baby, an injection of cortisone medication is given to the mother 1 to 2 days before the delivery. This helps the baby's lungs mature and makes it safer to deliver the baby.  CHANGES OCCURING IN THE THIRD TRIMESTER OF PREGNANCY  Your body goes through many changes during pregnancy. They vary from person to person. Talk to your caregiver about changes you notice and are concerned about.  · During the last trimester, you have probably had an increase in your appetite. It is normal to have cravings for certain foods. This varies from person to person and pregnancy to pregnancy.  · You may begin to   get stretch marks on your hips, abdomen, and breasts. These are normal changes in the body during pregnancy. There are no exercises or medications to take which prevent this change.  · Constipation may be treated with a stool softener or adding bulk to your diet. Drinking lots of fluids, fiber in vegetables, fruits, and whole grains are helpful.  · Exercising is also helpful. If you have been very active up until your pregnancy, most of these activities can be continued during your pregnancy. If you have been less active, it is helpful to start an exercise  program such as walking. Consult your caregiver before starting exercise programs.  · Avoid all smoking, alcohol, un-prescribed drugs, herbs and "street drugs" during your pregnancy. These chemicals affect the formation and growth of the baby. Avoid chemicals throughout the pregnancy to ensure the delivery of a healthy infant.  · Backache, varicose veins and hemorrhoids may develop or get worse.  · You will tire more easily in the third trimester, which is normal.  · The baby's movements may be stronger and more often.  · You may become short of breath easily.  · Your belly button may stick out.  · A yellow discharge may leak from your breasts called colostrum.  · You may have a bloody mucus discharge. This usually occurs a few days to a week before labor begins.  HOME CARE INSTRUCTIONS   · Keep your caregiver's appointments. Follow your caregiver's instructions regarding medication use, exercise, and diet.  · During pregnancy, you are providing food for you and your baby. Continue to eat regular, well-balanced meals. Choose foods such as meat, fish, milk and other low fat dairy products, vegetables, fruits, and whole-grain breads and cereals. Your caregiver will tell you of the ideal weight gain.  · A physical sexual relationship may be continued throughout pregnancy if there are no other problems such as early (premature) leaking of amniotic fluid from the membranes, vaginal bleeding, or belly (abdominal) pain.  · Exercise regularly if there are no restrictions. Check with your caregiver if you are unsure of the safety of your exercises. Greater weight gain will occur in the last 2 trimesters of pregnancy. Exercising helps:  · Control your weight.  · Get you in shape for labor and delivery.  · You lose weight after you deliver.  · Rest a lot with legs elevated, or as needed for leg cramps or low back pain.  · Wear a good support or jogging bra for breast tenderness during pregnancy. This may help if worn during  sleep. Pads or tissues may be used in the bra if you are leaking colostrum.  · Do not use hot tubs, steam rooms, or saunas.  · Wear your seat belt when driving. This protects you and your baby if you are in an accident.  · Avoid raw meat, cat litter boxes and soil used by cats. These carry germs that can cause birth defects in the baby.  · It is easier to loose urine during pregnancy. Tightening up and strengthening the pelvic muscles will help with this problem. You can practice stopping your urination while you are going to the bathroom. These are the same muscles you need to strengthen. It is also the muscles you would use if you were trying to stop from passing gas. You can practice tightening these muscles up 10 times a set and repeating this about 3 times per day. Once you know what muscles to tighten up, do not perform these   exercises during urination. It is more likely to cause an infection by backing up the urine.  · Ask for help if you have financial, counseling or nutritional needs during pregnancy. Your caregiver will be able to offer counseling for these needs as well as refer you for other special needs.  · Make a list of emergency phone numbers and have them available.  · Plan on getting help from family or friends when you go home from the hospital.  · Make a trial run to the hospital.  · Take prenatal classes with the father to understand, practice and ask questions about the labor and delivery.  · Prepare the baby's room/nursery.  · Do not travel out of the city unless it is absolutely necessary and with the advice of your caregiver.  · Wear only low or no heal shoes to have better balance and prevent falling.  MEDICATIONS AND DRUG USE IN PREGNANCY  · Take prenatal vitamins as directed. The vitamin should contain 1 milligram of folic acid. Keep all vitamins out of reach of children. Only a couple vitamins or tablets containing iron may be fatal to a baby or young child when ingested.  · Avoid use  of all medications, including herbs, over-the-counter medications, not prescribed or suggested by your caregiver. Only take over-the-counter or prescription medicines for pain, discomfort, or fever as directed by your caregiver. Do not use aspirin, ibuprofen (Motrin®, Advil®, Nuprin®) or naproxen (Aleve®) unless OK'd by your caregiver.  · Let your caregiver also know about herbs you may be using.  · Alcohol is related to a number of birth defects. This includes fetal alcohol syndrome. All alcohol, in any form, should be avoided completely. Smoking will cause low birth rate and premature babies.  · Street/illegal drugs are very harmful to the baby. They are absolutely forbidden. A baby born to an addicted mother will be addicted at birth. The baby will go through the same withdrawal an adult does.  SEEK MEDICAL CARE IF:  You have any concerns or worries during your pregnancy. It is better to call with your questions if you feel they cannot wait, rather than worry about them.  DECISIONS ABOUT CIRCUMCISION  You may or may not know the sex of your baby. If you know your baby is a boy, it may be time to think about circumcision. Circumcision is the removal of the foreskin of the penis. This is the skin that covers the sensitive end of the penis. There is no proven medical need for this. Often this decision is made on what is popular at the time or based upon religious beliefs and social issues. You can discuss these issues with your caregiver or pediatrician.  SEEK IMMEDIATE MEDICAL CARE IF:   · An unexplained oral temperature above 102° F (38.9° C) develops, or as your caregiver suggests.  · You have leaking of fluid from the vagina (birth canal). If leaking membranes are suspected, take your temperature and tell your caregiver of this when you call.  · There is vaginal spotting, bleeding or passing clots. Tell your caregiver of the amount and how many pads are used.  · You develop a bad smelling vaginal discharge with  a change in the color from clear to white.  · You develop vomiting that lasts more than 24 hours.  · You develop chills or fever.  · You develop shortness of breath.  · You develop burning on urination.  · You loose more than 2 pounds of weight   or gain more than 2 pounds of weight or as suggested by your caregiver.  · You notice sudden swelling of your face, hands, and feet or legs.  · You develop belly (abdominal) pain. Round ligament discomfort is a common non-cancerous (benign) cause of abdominal pain in pregnancy. Your caregiver still must evaluate you.  · You develop a severe headache that does not go away.  · You develop visual problems, blurred or double vision.  · If you have not felt your baby move for more than 1 hour. If you think the baby is not moving as much as usual, eat something with sugar in it and lie down on your left side for an hour. The baby should move at least 4 to 5 times per hour. Call right away if your baby moves less than that.  · You fall, are in a car accident or any kind of trauma.  · There is mental or physical violence at home.  Document Released: 12/03/2001 Document Revised: 03/02/2012 Document Reviewed: 06/07/2009  [employer]® Patient Information ©2013 [employer].

## 2013-04-25 ENCOUNTER — Other Ambulatory Visit (HOSPITAL_COMMUNITY): Payer: Self-pay | Admitting: Medical

## 2013-04-30 ENCOUNTER — Ambulatory Visit (HOSPITAL_COMMUNITY)
Admission: RE | Admit: 2013-04-30 | Discharge: 2013-04-30 | Disposition: A | Discharge status: Home / Self Care | Payer: Medicaid Other | Source: Ambulatory Visit | Attending: Obstetrics and Gynecology | Admitting: Obstetrics and Gynecology

## 2013-04-30 VITALS — BP 115/76 | HR 104 | Wt 221.0 lb

## 2013-04-30 DIAGNOSIS — Z3689 Encounter for other specified antenatal screening: Secondary | ICD-10-CM | POA: Insufficient documentation

## 2013-04-30 DIAGNOSIS — O358XX0 Maternal care for other (suspected) fetal abnormality and damage, not applicable or unspecified: Secondary | ICD-10-CM

## 2013-04-30 DIAGNOSIS — O209 Hemorrhage in early pregnancy, unspecified: Secondary | ICD-10-CM | POA: Insufficient documentation

## 2013-04-30 DIAGNOSIS — O4692 Antepartum hemorrhage, unspecified, second trimester: Secondary | ICD-10-CM

## 2013-04-30 NOTE — Progress Notes (Signed)
Caitlin Sullivan  was seen today for an ultrasound appointment.  See full report in [facility].  Impression: Single IUP at 27 6//7 weeks Follow up due to suspected subchorionic hemorrhage.   The previously noted subchorionic fluid collection is not visualized - appears to have resolved. Fetal growth is appropriate (77th %tile) The right renal pelvis measures 4.2 mm (borderline/ mild renal pylectasis).  The left kidney appears normal. The remainder of the fetal anatomy is within normal limits. Normal amniotic fluid volume  Recommendations: Recommend follow-up ultrasound examination in 4 weeks to reevaluate the fetal kidneys.  Alpha Gula, MD

## 2013-05-06 ENCOUNTER — Ambulatory Visit (INDEPENDENT_AMBULATORY_CARE_PROVIDER_SITE_OTHER): Payer: Self-pay | Admitting: Family Medicine

## 2013-05-06 VITALS — BP 113/74 | Temp 98.2°F | Wt 220.4 lb

## 2013-05-06 DIAGNOSIS — O358XX0 Maternal care for other (suspected) fetal abnormality and damage, not applicable or unspecified: Secondary | ICD-10-CM

## 2013-05-06 DIAGNOSIS — O359XX0 Maternal care for (suspected) fetal abnormality and damage, unspecified, not applicable or unspecified: Secondary | ICD-10-CM | POA: Insufficient documentation

## 2013-05-06 DIAGNOSIS — O210 Mild hyperemesis gravidarum: Secondary | ICD-10-CM

## 2013-05-06 DIAGNOSIS — O359XX1 Maternal care for (suspected) fetal abnormality and damage, unspecified, fetus 1: Secondary | ICD-10-CM

## 2013-05-06 LAB — POCT URINALYSIS DIP (DEVICE)
Hgb urine dipstick: NEGATIVE
Protein, ur: NEGATIVE mg/dL
Specific Gravity, Urine: 1.025 (ref 1.005–1.030)
Urobilinogen, UA: 0.2 mg/dL (ref 0.0–1.0)

## 2013-05-06 LAB — CBC
HCT: 33.5 % — ABNORMAL LOW (ref 36.0–46.0)
MCHC: 33.1 g/dL (ref 30.0–36.0)
MCV: 88.6 fL (ref 78.0–100.0)
RDW: 13.4 % (ref 11.5–15.5)

## 2013-05-06 NOTE — Patient Instructions (Addendum)
Pregnancy - Third Trimester The third trimester of pregnancy (the last 3 months) is a period of the most rapid growth for you and your baby. The baby approaches a length of 20 inches and a weight of 6 to 10 pounds. The baby is adding on fat and getting ready for life outside your body. While inside, babies have periods of sleeping and waking, suck their thumbs, and hiccups. You can often feel small contractions of the uterus. This is false labor. It is also called Braxton-Hicks contractions. This is like a practice for labor. The usual problems in this stage of pregnancy include more difficulty breathing, swelling of the hands and feet from water retention, and having to urinate more often because of the uterus and baby pressing on your bladder.  PRENATAL EXAMS  Blood work may continue to be done during prenatal exams. These tests are done to check on your health and the probable health of your baby. Blood work is used to follow your blood levels (hemoglobin). Anemia (low hemoglobin) is common during pregnancy. Iron and vitamins are given to help prevent this. You may also continue to be checked for diabetes. Some of the past blood tests may be done again.  The size of the uterus is measured during each visit. This makes sure your baby is growing properly according to your pregnancy dates.  Your blood pressure is checked every prenatal visit. This is to make sure you are not getting toxemia.  Your urine is checked every prenatal visit for infection, diabetes and protein.  Your weight is checked at each visit. This is done to make sure gains are happening at the suggested rate and that you and your baby are growing normally.  Sometimes, an ultrasound is performed to confirm the position and the proper growth and development of the baby. This is a test done that bounces harmless sound waves off the baby so your caregiver can more accurately determine due dates.  Discuss the type of pain medication  and anesthesia you will have during your labor and delivery.  Discuss the possibility and anesthesia if a Cesarean Section might be necessary.  Inform your caregiver if there is any mental or physical violence at home. Sometimes, a specialized non-stress test, contraction stress test and biophysical profile are done to make sure the baby is not having a problem. Checking the amniotic fluid surrounding the baby is called an amniocentesis. The amniotic fluid is removed by sticking a needle into the belly (abdomen). This is sometimes done near the end of pregnancy if an early delivery is required. In this case, it is done to help make sure the baby's lungs are mature enough for the baby to live outside of the womb. If the lungs are not mature and it is unsafe to deliver the baby, an injection of cortisone medication is given to the mother 1 to 2 days before the delivery. This helps the baby's lungs mature and makes it safer to deliver the baby. CHANGES OCCURING IN THE THIRD TRIMESTER OF PREGNANCY Your body goes through many changes during pregnancy. They vary from person to person. Talk to your caregiver about changes you notice and are concerned about.  During the last trimester, you have probably had an increase in your appetite. It is normal to have cravings for certain foods. This varies from person to person and pregnancy to pregnancy.  You may begin to get stretch marks on your hips, abdomen, and breasts. These are normal changes in the body   during pregnancy. There are no exercises or medications to take which prevent this change.  Constipation may be treated with a stool softener or adding bulk to your diet. Drinking lots of fluids, fiber in vegetables, fruits, and whole grains are helpful.  Exercising is also helpful. If you have been very active up until your pregnancy, most of these activities can be continued during your pregnancy. If you have been less active, it is helpful to start an  exercise program such as walking. Consult your caregiver before starting exercise programs.  Avoid all smoking, alcohol, un-prescribed drugs, herbs and "street drugs" during your pregnancy. These chemicals affect the formation and growth of the baby. Avoid chemicals throughout the pregnancy to ensure the delivery of a healthy infant.  Backache, varicose veins and hemorrhoids may develop or get worse.  You will tire more easily in the third trimester, which is normal.  The baby's movements may be stronger and more often.  You may become short of breath easily.  Your belly button may stick out.  A yellow discharge may leak from your breasts called colostrum.  You may have a bloody mucus discharge. This usually occurs a few days to a week before labor begins. HOME CARE INSTRUCTIONS   Keep your caregiver's appointments. Follow your caregiver's instructions regarding medication use, exercise, and diet.  During pregnancy, you are providing food for you and your baby. Continue to eat regular, well-balanced meals. Choose foods such as meat, fish, milk and other low fat dairy products, vegetables, fruits, and whole-grain breads and cereals. Your caregiver will tell you of the ideal weight gain.  A physical sexual relationship may be continued throughout pregnancy if there are no other problems such as early (premature) leaking of amniotic fluid from the membranes, vaginal bleeding, or belly (abdominal) pain.  Exercise regularly if there are no restrictions. Check with your caregiver if you are unsure of the safety of your exercises. Greater weight gain will occur in the last 2 trimesters of pregnancy. Exercising helps:  Control your weight.  Get you in shape for labor and delivery.  You lose weight after you deliver.  Rest a lot with legs elevated, or as needed for leg cramps or low back pain.  Wear a good support or jogging bra for breast tenderness during pregnancy. This may help if worn  during sleep. Pads or tissues may be used in the bra if you are leaking colostrum.  Do not use hot tubs, steam rooms, or saunas.  Wear your seat belt when driving. This protects you and your baby if you are in an accident.  Avoid raw meat, cat litter boxes and soil used by cats. These carry germs that can cause birth defects in the baby.  It is easier to loose urine during pregnancy. Tightening up and strengthening the pelvic muscles will help with this problem. You can practice stopping your urination while you are going to the bathroom. These are the same muscles you need to strengthen. It is also the muscles you would use if you were trying to stop from passing gas. You can practice tightening these muscles up 10 times a set and repeating this about 3 times per day. Once you know what muscles to tighten up, do not perform these exercises during urination. It is more likely to cause an infection by backing up the urine.  Ask for help if you have financial, counseling or nutritional needs during pregnancy. Your caregiver will be able to offer counseling for these   needs as well as refer you for other special needs.  Make a list of emergency phone numbers and have them available.  Plan on getting help from family or friends when you go home from the hospital.  Make a trial run to the hospital.  Take prenatal classes with the father to understand, practice and ask questions about the labor and delivery.  Prepare the baby's room/nursery.  Do not travel out of the city unless it is absolutely necessary and with the advice of your caregiver.  Wear only low or no heal shoes to have better balance and prevent falling. MEDICATIONS AND DRUG USE IN PREGNANCY  Take prenatal vitamins as directed. The vitamin should contain 1 milligram of folic acid. Keep all vitamins out of reach of children. Only a couple vitamins or tablets containing iron may be fatal to a baby or young child when  ingested.  Avoid use of all medications, including herbs, over-the-counter medications, not prescribed or suggested by your caregiver. Only take over-the-counter or prescription medicines for pain, discomfort, or fever as directed by your caregiver. Do not use aspirin, ibuprofen (Motrin, Advil, Nuprin) or naproxen (Aleve) unless OK'd by your caregiver.  Let your caregiver also know about herbs you may be using.  Alcohol is related to a number of birth defects. This includes fetal alcohol syndrome. All alcohol, in any form, should be avoided completely. Smoking will cause low birth rate and premature babies.  Street/illegal drugs are very harmful to the baby. They are absolutely forbidden. A baby born to an addicted mother will be addicted at birth. The baby will go through the same withdrawal an adult does. SEEK MEDICAL CARE IF: You have any concerns or worries during your pregnancy. It is better to call with your questions if you feel they cannot wait, rather than worry about them. DECISIONS ABOUT CIRCUMCISION You may or may not know the sex of your baby. If you know your baby is a boy, it may be time to think about circumcision. Circumcision is the removal of the foreskin of the penis. This is the skin that covers the sensitive end of the penis. There is no proven medical need for this. Often this decision is made on what is popular at the time or based upon religious beliefs and social issues. You can discuss these issues with your caregiver or pediatrician. SEEK IMMEDIATE MEDICAL CARE IF:   An unexplained oral temperature above 102 F (38.9 C) develops, or as your caregiver suggests.  You have leaking of fluid from the vagina (birth canal). If leaking membranes are suspected, take your temperature and tell your caregiver of this when you call.  There is vaginal spotting, bleeding or passing clots. Tell your caregiver of the amount and how many pads are used.  You develop a bad smelling  vaginal discharge with a change in the color from clear to white.  You develop vomiting that lasts more than 24 hours.  You develop chills or fever.  You develop shortness of breath.  You develop burning on urination.  You loose more than 2 pounds of weight or gain more than 2 pounds of weight or as suggested by your caregiver.  You notice sudden swelling of your face, hands, and feet or legs.  You develop belly (abdominal) pain. Round ligament discomfort is a common non-cancerous (benign) cause of abdominal pain in pregnancy. Your caregiver still must evaluate you.  You develop a severe headache that does not go away.  You develop visual   problems, blurred or double vision.  If you have not felt your baby move for more than 1 hour. If you think the baby is not moving as much as usual, eat something with sugar in it and lie down on your left side for an hour. The baby should move at least 4 to 5 times per hour. Call right away if your baby moves less than that.  You fall, are in a car accident or any kind of trauma.  There is mental or physical violence at home. Document Released: 12/03/2001 Document Revised: 03/02/2012 Document Reviewed: 06/07/2009 [employer] Patient Information 2013 [employer].  Breastfeeding Deciding to breastfeed is one of the best choices you can make for you and your baby. The information that follows gives a brief overview of the benefits of breastfeeding as well as common topics surrounding breastfeeding. BENEFITS OF BREASTFEEDING For the baby  The first milk (colostrum) helps the baby's digestive system function better.   There are antibodies in the mother's milk that help the baby fight off infections.   The baby has a lower incidence of asthma, allergies, and sudden infant death syndrome (SIDS).   The nutrients in breast milk are better for the baby than infant formulas, and breast milk helps the baby's brain grow better.   Babies who  breastfeed have less gas, colic, and constipation.  For the mother  Breastfeeding helps develop a very special bond between the mother and her baby.   Breastfeeding is convenient, always available at the correct temperature, and costs nothing.   Breastfeeding burns calories in the mother and helps her lose weight that was gained during pregnancy.   Breastfeeding makes the uterus contract back down to normal size faster and slows bleeding following delivery.   Breastfeeding mothers have a lower risk of developing breast cancer.  BREASTFEEDING FREQUENCY  A healthy, full-term baby may breastfeed as often as every hour or space his or her feedings to every 3 hours.   Watch your baby for signs of hunger. Nurse your baby if he or she shows signs of hunger. How often you nurse will vary from baby to baby.   Nurse as often as the baby requests, or when you feel the need to reduce the fullness of your breasts.   Awaken the baby if it has been 3 4 hours since the last feeding.   Frequent feeding will help the mother make more milk and will help prevent problems, such as sore nipples and engorgement of the breasts.  BABY'S POSITION AT THE BREAST  Whether lying down or sitting, be sure that the baby's tummy is facing your tummy.   Support the breast with 4 fingers underneath the breast and the thumb above. Make sure your fingers are well away from the nipple and baby's mouth.   Stroke the baby's lips gently with your finger or nipple.   When the baby's mouth is open wide enough, place all of your nipple and as much of the areola as possible into your baby's mouth.   Pull the baby in close so the tip of the nose and the baby's cheeks touch the breast during the feeding.  FEEDINGS AND SUCTION  The length of each feeding varies from baby to baby and from feeding to feeding.   The baby must suck about 2 3 minutes for your milk to get to him or her. This is called a "let down."  For this reason, allow the baby to feed on each breast as   long as he or she wants. Your baby will end the feeding when he or she has received the right balance of nutrients.   To break the suction, put your finger into the corner of the baby's mouth and slide it between his or her gums before removing your breast from his or her mouth. This will help prevent sore nipples.  HOW TO TELL WHETHER YOUR BABY IS GETTING ENOUGH BREAST MILK. Wondering whether or not your baby is getting enough milk is a common concern among mothers. You can be assured that your baby is getting enough milk if:   Your baby is actively sucking and you hear swallowing.   Your baby seems relaxed and satisfied after a feeding.   Your baby nurses at least 8 12 times in a 24 hour time period. Nurse your baby until he or she unlatches or falls asleep at the first breast (at least 10 20 minutes), then offer the second side.   Your baby is wetting 5 6 disposable diapers (6 8 cloth diapers) in a 24 hour period by 5 6 days of age.   Your baby is having at least 3 4 stools every 24 hours for the first 6 weeks. The stool should be soft and yellow.   Your baby should gain 4 7 ounces per week after he or she is 4 days old.   Your breasts feel softer after nursing.  REDUCING BREAST ENGORGEMENT  In the first week after your baby is born, you may experience signs of breast engorgement. When breasts are engorged, they feel heavy, warm, full, and may be tender to the touch. You can reduce engorgement if you:   Nurse frequently, every 2 3 hours. Mothers who breastfeed early and often have fewer problems with engorgement.   Place light ice packs on your breasts for 10 20 minutes between feedings. This reduces swelling. Wrap the ice packs in a lightweight towel to protect your skin. Bags of frozen vegetables work well for this purpose.   Take a warm shower or apply warm, moist heat to your breast for 5 10 minutes just before  each feeding. This increases circulation and helps the milk flow.   Gently massage your breast before and during the feeding. Using your finger tips, massage from the chest wall towards your nipple in a circular motion.   Make sure that the baby empties at least one breast at every feeding before switching sides.   Use a breast pump to empty the breasts if your baby is sleepy or not nursing well. You may also want to pump if you are returning to work oryou feel you are getting engorged.   Avoid bottle feeds, pacifiers, or supplemental feedings of water or juice in place of breastfeeding. Breast milk is all the food your baby needs. It is not necessary for your baby to have water or formula. In fact, to help your breasts make more milk, it is best not to give your baby supplemental feedings during the early weeks.   Be sure the baby is latched on and positioned properly while breastfeeding.   Wear a supportive bra, avoiding underwire styles.   Eat a balanced diet with enough fluids.   Rest often, relax, and take your prenatal vitamins to prevent fatigue, stress, and anemia.  If you follow these suggestions, your engorgement should improve in 24 48 hours. If you are still experiencing difficulty, call your lactation consultant or caregiver.  CARING FOR YOURSELF Take care of your   breasts  Bathe or shower daily.   Avoid using soap on your nipples.   Start feedings on your left breast at one feeding and on your right breast at the next feeding.   You will notice an increase in your milk supply 2 5 days after delivery. You may feel some discomfort from engorgement, which makes your breasts very firm and often tender. Engorgement "peaks" out within 24 48 hours. In the meantime, apply warm moist towels to your breasts for 5 10 minutes before feeding. Gentle massage and expression of some milk before feeding will soften your breasts, making it easier for your baby to latch on.    Wear a well-fitting nursing bra, and air dry your nipples for a 3 4minutes after each feeding.   Only use cotton bra pads.   Only use pure lanolin on your nipples after nursing. You do not need to wash it off before feeding the baby again. Another option is to express a few drops of breast milk and gently massage it into your nipples.  Take care of yourself  Eat well-balanced meals and nutritious snacks.   Drinking milk, fruit juice, and water to satisfy your thirst (about 8 glasses a day).   Get plenty of rest.  Avoid foods that you notice affect the baby in a bad way.  SEEK MEDICAL CARE IF:   You have difficulty with breastfeeding and need help.   You have a hard, red, sore area on your breast that is accompanied by a fever.   Your baby is too sleepy to eat well or is having trouble sleeping.   Your baby is wetting less than 6 diapers a day, by 5 days of age.   Your baby's skin or white part of his or her eyes is more yellow than it was in the hospital.   You feel depressed.  Document Released: 12/09/2005 Document Revised: 06/09/2012 Document Reviewed: 03/08/2012 [employer] Patient Information 2013 [employer]. Preterm Labor Preterm labor is when labor starts at less than 37 weeks of pregnancy. The normal length of a pregnancy is 39 to 41 weeks. CAUSES Often, there is no identifiable underlying cause as to why a woman goes into preterm labor. However, one of the most common known causes of preterm labor is infection. Infections of the uterus, cervix, vagina, amniotic sac, bladder, kidney, or even the lungs (pneumonia) can cause labor to start. Other causes of preterm labor include:  Urogenital infections, such as yeast infections and bacterial vaginosis.  Uterine abnormalities (uterine shape, uterine septum, fibroids, bleeding from the placenta).  A cervix that has been operated on and opens prematurely.  Malformations in the baby.  Multiple gestations  (twins, triplets, and so on).  Breakage of the amniotic sac. Additional risk factors for preterm labor include:  Previous history of preterm labor.  Premature rupture of membranes (PROM).  A placenta that covers the opening of the cervix (placenta previa).  A placenta that separates from the uterus (placenta abruption).  A cervix that is too weak to hold the baby in the uterus (incompetence cervix).  Having too much fluid in the amniotic sac (polyhydramnios).  Taking illegal drugs or smoking while pregnant.  Not gaining enough weight while pregnant.  Women younger than 18 and older than 21 years old.  Low socioeconomic status.  African-American ethnicity. SYMPTOMS Signs and symptoms of preterm labor include:  Menstrual-like cramps.  Contractions that are 30 to 70 seconds apart, become very regular, closer together, and are more intense   and painful.  Contractions that start on the top of the uterus and spread down to the lower abdomen and back.  A sense of increased pelvic pressure or back pain.  A watery or bloody discharge that comes from the vagina. DIAGNOSIS  A diagnosis can be confirmed by:  A vaginal exam.  An ultrasound of the cervix.  Sampling (swabbing) cervico-vaginal secretions. These samples can be tested for the presence of fetal fibronectin. This is a protein found in cervical discharge which is associated with preterm labor.  Fetal monitoring. TREATMENT  Depending on the length of the pregnancy and other circumstances, a caregiver may suggest bed rest. If necessary, there are medicines that can be given to stop contractions and to quicken fetal lung maturity. If labor happens before 34 weeks of pregnancy, a prolonged hospital stay may be recommended. Treatment depends on the condition of both the mother and baby. PREVENTION There are some things a mother can do to lower the risk of preterm labor in future pregnancies. A woman can:   Stop  smoking.  Maintain healthy weight gain and avoid chemicals and drugs that are not necessary.  Be watchful for any type of infection.  Inform her caregiver if she has a known history of preterm labor. Document Released: 02/29/2004 Document Revised: 03/02/2012 Document Reviewed: 04/05/2011 [employer] Patient Information 2013 [employer].  

## 2013-05-06 NOTE — Progress Notes (Signed)
Pulse-  95 

## 2013-05-06 NOTE — Progress Notes (Signed)
Still throwing up, mild contractions 28 wk labs today U/s for growth on 5/9 showed 77% with mild right renal pyelectasis.--F/u in 4 wks with MFM.

## 2013-05-07 ENCOUNTER — Telehealth: Payer: Self-pay | Admitting: General Practice

## 2013-05-07 ENCOUNTER — Encounter: Payer: Self-pay | Admitting: Family Medicine

## 2013-05-07 NOTE — Telephone Encounter (Signed)
Pt informed and given instructions by [facility].

## 2013-05-07 NOTE — Telephone Encounter (Signed)
Message copied by Kathee Delton on Fri May 07, 2013  9:46 AM ------      Message from: Reva Bores      Created: Fri May 07, 2013  8:05 AM       abnl 1 hour--please schedule for 3 hour. ------

## 2013-05-07 NOTE — Telephone Encounter (Signed)
Called patient and some woman answered and said she wasn't there and wouldn't be back till after 1. Told her to let the patient know a nurse from the [facility] clinics is trying to get in touch with her.

## 2013-05-10 ENCOUNTER — Other Ambulatory Visit: Payer: Self-pay

## 2013-05-12 ENCOUNTER — Other Ambulatory Visit (HOSPITAL_COMMUNITY): Payer: Self-pay | Admitting: Medical

## 2013-05-14 ENCOUNTER — Inpatient Hospital Stay (HOSPITAL_COMMUNITY): Payer: Medicaid Other

## 2013-05-14 ENCOUNTER — Encounter (HOSPITAL_COMMUNITY): Payer: Self-pay | Admitting: *Deleted

## 2013-05-14 ENCOUNTER — Inpatient Hospital Stay (HOSPITAL_COMMUNITY)
Admission: AD | Admit: 2013-05-14 | Discharge: 2013-05-15 | DRG: 781 | Disposition: A | Discharge status: Home / Self Care | Payer: Medicaid Other | Source: Ambulatory Visit | Attending: Obstetrics and Gynecology | Admitting: Obstetrics and Gynecology

## 2013-05-14 DIAGNOSIS — R109 Unspecified abdominal pain: Secondary | ICD-10-CM

## 2013-05-14 DIAGNOSIS — K8020 Calculus of gallbladder without cholecystitis without obstruction: Secondary | ICD-10-CM

## 2013-05-14 DIAGNOSIS — R1013 Epigastric pain: Secondary | ICD-10-CM | POA: Diagnosis present

## 2013-05-14 DIAGNOSIS — O26619 Liver and biliary tract disorders in pregnancy, unspecified trimester: Secondary | ICD-10-CM

## 2013-05-14 DIAGNOSIS — R1011 Right upper quadrant pain: Secondary | ICD-10-CM | POA: Diagnosis present

## 2013-05-14 DIAGNOSIS — O212 Late vomiting of pregnancy: Secondary | ICD-10-CM | POA: Diagnosis present

## 2013-05-14 DIAGNOSIS — O9989 Other specified diseases and conditions complicating pregnancy, childbirth and the puerperium: Principal | ICD-10-CM | POA: Diagnosis present

## 2013-05-14 LAB — URINALYSIS, ROUTINE W REFLEX MICROSCOPIC
Glucose, UA: NEGATIVE mg/dL
Hgb urine dipstick: NEGATIVE
Ketones, ur: NEGATIVE mg/dL
Protein, ur: NEGATIVE mg/dL
pH: 6.5 (ref 5.0–8.0)

## 2013-05-14 LAB — CBC WITH DIFFERENTIAL/PLATELET
Basophils Relative: 0 % (ref 0–1)
Eosinophils Absolute: 0.1 10*3/uL (ref 0.0–0.7)
HCT: 33.1 % — ABNORMAL LOW (ref 36.0–46.0)
Hemoglobin: 11.3 g/dL — ABNORMAL LOW (ref 12.0–15.0)
Lymphs Abs: 2.2 10*3/uL (ref 0.7–4.0)
MCH: 29.7 pg (ref 26.0–34.0)
MCHC: 34.1 g/dL (ref 30.0–36.0)
Monocytes Absolute: 1.1 10*3/uL — ABNORMAL HIGH (ref 0.1–1.0)
Monocytes Relative: 7 % (ref 3–12)

## 2013-05-14 LAB — COMPREHENSIVE METABOLIC PANEL
ALT: 10 U/L (ref 0–35)
AST: 15 U/L (ref 0–37)
Albumin: 2.9 g/dL — ABNORMAL LOW (ref 3.5–5.2)
Calcium: 9.2 mg/dL (ref 8.4–10.5)
Sodium: 136 mEq/L (ref 135–145)
Total Protein: 6.7 g/dL (ref 6.0–8.3)

## 2013-05-14 MED ORDER — ONDANSETRON 4 MG PO TBDP
4.0000 mg | ORAL_TABLET | Freq: Four times a day (QID) | ORAL | Status: DC | PRN
  Filled 2013-05-14: qty 1

## 2013-05-14 MED ORDER — PROMETHAZINE HCL 25 MG/ML IJ SOLN
25.0000 mg | Freq: Four times a day (QID) | INTRAMUSCULAR | Status: DC | PRN
  Administered 2013-05-14: 25 mg via INTRAVENOUS
  Filled 2013-05-14: qty 1

## 2013-05-14 MED ORDER — HYDROMORPHONE HCL PF 2 MG/ML IJ SOLN
2.0000 mg | INTRAMUSCULAR | Status: DC | PRN
  Administered 2013-05-14: 2 mg via INTRAVENOUS
  Filled 2013-05-14: qty 1

## 2013-05-14 MED ORDER — ACETAMINOPHEN 325 MG PO TABS: 650.0000 mg | ORAL_TABLET | ORAL | Status: DC | PRN

## 2013-05-14 MED ORDER — DOCUSATE SODIUM 100 MG PO CAPS
100.0000 mg | ORAL_CAPSULE | Freq: Every day | ORAL | Status: DC
  Administered 2013-05-15: 100 mg via ORAL
  Filled 2013-05-14: qty 1

## 2013-05-14 MED ORDER — CALCIUM CARBONATE ANTACID 500 MG PO CHEW: 2.0000 | CHEWABLE_TABLET | ORAL | Status: DC | PRN

## 2013-05-14 MED ORDER — LACTATED RINGERS IV BOLUS (SEPSIS): 1000.0000 mL | Freq: Once | INTRAVENOUS | Status: DC

## 2013-05-14 MED ORDER — HYDROMORPHONE HCL PF 1 MG/ML IJ SOLN: 1.0000 mg | INTRAMUSCULAR | Status: DC | PRN

## 2013-05-14 MED ORDER — OXYCODONE-ACETAMINOPHEN 5-325 MG PO TABS: 1.0000 | ORAL_TABLET | ORAL | Status: DC | PRN

## 2013-05-14 MED ORDER — ZOLPIDEM TARTRATE 5 MG PO TABS
5.0000 mg | ORAL_TABLET | Freq: Every evening | ORAL | Status: DC | PRN
  Administered 2013-05-15: 5 mg via ORAL
  Filled 2013-05-14: qty 1

## 2013-05-14 MED ORDER — PROMETHAZINE HCL 25 MG PO TABS: 25.0000 mg | ORAL_TABLET | Freq: Four times a day (QID) | ORAL | Status: DC | PRN

## 2013-05-14 MED ORDER — PRENATAL MULTIVITAMIN CH
1.0000 | ORAL_TABLET | Freq: Every day | ORAL | Status: DC
  Administered 2013-05-15: 1 via ORAL
  Filled 2013-05-14: qty 1

## 2013-05-14 MED ORDER — ONDANSETRON HCL 4 MG/2ML IJ SOLN
4.0000 mg | Freq: Four times a day (QID) | INTRAMUSCULAR | Status: DC | PRN
  Administered 2013-05-14: 4 mg via INTRAVENOUS
  Filled 2013-05-14: qty 2

## 2013-05-14 MED ORDER — ALBUTEROL SULFATE HFA 108 (90 BASE) MCG/ACT IN AERS: 1.0000 | INHALATION_SPRAY | Freq: Four times a day (QID) | RESPIRATORY_TRACT | Status: DC | PRN

## 2013-05-14 MED ORDER — ONDANSETRON HCL 4 MG/2ML IJ SOLN
4.0000 mg | Freq: Four times a day (QID) | INTRAMUSCULAR | Status: DC | PRN
  Administered 2013-05-14 – 2013-05-15 (×2): 4 mg via INTRAVENOUS
  Filled 2013-05-14 (×2): qty 2

## 2013-05-14 MED ORDER — LACTATED RINGERS IV SOLN
INTRAVENOUS | Status: DC
  Administered 2013-05-14 – 2013-05-15 (×2): via INTRAVENOUS

## 2013-05-14 MED ORDER — LACTATED RINGERS IV BOLUS (SEPSIS)
1000.0000 mL | Freq: Once | INTRAVENOUS | Status: AC
  Administered 2013-05-14 (×2): 1000 mL via INTRAVENOUS

## 2013-05-14 NOTE — MAU Note (Signed)
Patient states she is glad that pain is relieved but does not like the feeling of dizziness and lightheadedness from the med.  Requests that Phenergan be delayed for now.

## 2013-05-14 NOTE — Progress Notes (Signed)
Caitlin Sullivan Apr 27, 1991  [id number].   Primary GYN: Faculty practice Requesting MD: Dr. Monico Sullivan. Caitlin Sullivan Chief Complaint/Reason for Consult: RUQ abdominal pain, N/V, Cholelithiasis HPI:  22 y.o. female G2P0010 at [redacted]w[redacted]d who presents today to the MAU for RUQ and upper abdominal pain with N/V that has been going on since 6am. Her PMH is significant for vaginal bleeding in pregnancy and hyperemesis. FH is significant for gallbladder disease in her mother what occurred during pregnancy.  Upon waking, she had 9/10 RUQ and epigastric abdominal pains that made her very nauseous and causing her to vomit. She has vomited 6-7 times this morning with bile emesis. She has tried Phenergan and Zofran that she takes regularly for nausea. She had a cheeseburger for dinner last night with no issues, but believes may be the cause of her symptoms. She denies any vaginal discharge or bleeding and feels baby move regularly. Was noted to have high blood sugars on a recent visit.  Appetite has been lower during pregnancy due to nausea, but was able to eat over the last week except for this am.  Normal BM's no diarrhea.  ROS: All systems reviewed and otherwise negative except for as above  Family History  Problem Relation Age of Onset  . Cancer Mother   . Hypertension Mother   . Diabetes Father   . Hypertension Father   . Hyperlipidemia Father   . Stroke Father   . Asthma Brother     Past Medical History  Diagnosis Date  . Umbilical hernia   . Hyperemesis arising during pregnancy   . Asthma     Past Surgical History  Procedure Laterality Date  . No past surgeries    . Pilonidal cyst / sinus excision      Social History:  reports that she quit smoking about 6 months ago. Her smoking use included Cigarettes. She smoked 0.25 packs per day. She has never used smokeless tobacco. She reports that she does not drink alcohol or use illicit drugs.  Allergies: No Known Allergies  Medications Prior to Admission   Medication Sig Dispense Refill  . albuterol (PROVENTIL HFA;VENTOLIN HFA) 108 (90 BASE) MCG/ACT inhaler Inhale 1-2 puffs into the lungs every 6 (six) hours as needed. For wheezing or shortness of breath      . ondansetron (ZOFRAN-ODT) 8 MG disintegrating tablet DISSOLVE ONE TABLET BY MOUTH EVERY 8 HOURS AS NEEDED FOR NAUSEA  20 tablet  0  . promethazine (PHENERGAN) 25 MG tablet Take 1 tablet (25 mg total) by mouth every 6 (six) hours as needed. nausea  30 tablet  6    Blood pressure 115/62, pulse 92, temperature 98 F (36.7 C), temperature source Oral, resp. rate 16, height 5\' 6"  (1.676 m), weight 99.338 kg (219 lb), last menstrual period 09/29/2012. Physical Exam: General: pleasant, WD/WN Hispanic female who is laying in bed in NAD HEENT: head is normocephalic, atraumatic.  Sclera are noninjected.  PERRL.  Ears and nose without any masses or lesions.  Mouth is pink and moist Heart: regular, rate, and rhythm.  No obvious murmurs, gallops, or rubs noted.  Palpable pedal pulses bilaterally Lungs: CTAB, no wheezes, rhonchi, or rales noted.  Respiratory effort nonlabored Abd: soft, mild tenderness in the RUQ with deep palpation, ND, +BS, negative murphy's sign, no masses, umbilical hernia tender to palpation, no strangulated or incarcerated; uterus felt 1/2 way above the umbilicus and zyphoid process, no peritoneal signs MS: all 4 extremities are symmetrical with no cyanosis, clubbing, or edema. Skin: warm  and dry with no masses, lesions, or rashes Psych: A&Ox3 with an appropriate affect.  Results for orders placed during the hospital encounter of 05/14/13 (from the past 48 hour(s))  URINALYSIS, ROUTINE W REFLEX MICROSCOPIC     Status: None   Collection Time    05/14/13  9:35 AM      Result Value Range   Color, Urine YELLOW  YELLOW   APPearance CLEAR  CLEAR   Specific Gravity, Urine 1.020  1.005 - 1.030   pH 6.5  5.0 - 8.0   Glucose, UA NEGATIVE  NEGATIVE mg/dL   Hgb urine dipstick NEGATIVE   NEGATIVE   Bilirubin Urine NEGATIVE  NEGATIVE   Ketones, ur NEGATIVE  NEGATIVE mg/dL   Protein, ur NEGATIVE  NEGATIVE mg/dL   Urobilinogen, UA 0.2  0.0 - 1.0 mg/dL   Nitrite NEGATIVE  NEGATIVE   Leukocytes, UA NEGATIVE  NEGATIVE   Comment: MICROSCOPIC NOT DONE ON URINES WITH NEGATIVE PROTEIN, BLOOD, LEUKOCYTES, NITRITE, OR GLUCOSE <1000 mg/dL.  COMPREHENSIVE METABOLIC PANEL     Status: Abnormal   Collection Time    05/14/13 10:18 AM      Result Value Range   Sodium 136  135 - 145 mEq/L   Potassium 3.6  3.5 - 5.1 mEq/L   Chloride 104  96 - 112 mEq/L   CO2 23  19 - 32 mEq/L   Glucose, Bld 106 (*) 70 - 99 mg/dL   BUN 7  6 - 23 mg/dL   Creatinine, Ser 1.61 (*) 0.50 - 1.10 mg/dL   Calcium 9.2  8.4 - 09.6 mg/dL   Total Protein 6.7  6.0 - 8.3 g/dL   Albumin 2.9 (*) 3.5 - 5.2 g/dL   AST 15  0 - 37 U/L   ALT 10  0 - 35 U/L   Alkaline Phosphatase 91  39 - 117 U/L   Total Bilirubin 0.2 (*) 0.3 - 1.2 mg/dL   GFR calc non Af Amer >90  >90 mL/min   GFR calc Af Amer >90  >90 mL/min   Comment:            The eGFR has been calculated     using the CKD EPI equation.     This calculation has not been     validated in all clinical     situations.     eGFR's persistently     <90 mL/min signify     possible Chronic Kidney Disease.  CBC WITH DIFFERENTIAL     Status: Abnormal   Collection Time    05/14/13 10:18 AM      Result Value Range   WBC 15.4 (*) 4.0 - 10.5 K/uL   RBC 3.80 (*) 3.87 - 5.11 MIL/uL   Hemoglobin 11.3 (*) 12.0 - 15.0 g/dL   HCT 04.5 (*) 40.9 - 81.1 %   MCV 87.1  78.0 - 100.0 fL   MCH 29.7  26.0 - 34.0 pg   MCHC 34.1  30.0 - 36.0 g/dL   RDW 91.4  78.2 - 95.6 %   Platelets 268  150 - 400 K/uL   Neutrophils Relative % 77  43 - 77 %   Neutro Abs 11.9 (*) 1.7 - 7.7 K/uL   Lymphocytes Relative 15  12 - 46 %   Lymphs Abs 2.2  0.7 - 4.0 K/uL   Monocytes Relative 7  3 - 12 %   Monocytes Absolute 1.1 (*) 0.1 - 1.0 K/uL   Eosinophils Relative 1  0 - 5 %   Eosinophils Absolute  0.1  0.0 - 0.7 K/uL   Basophils Relative 0  0 - 1 %   Basophils Absolute 0.0  0.0 - 0.1 K/uL  LIPASE, BLOOD     Status: None   Collection Time    05/14/13 10:18 AM      Result Value Range   Lipase 20  11 - 59 U/L   US Abdomen Limited Ruq  05/14/2013   *RADIOLOGY REPORT*  Clinical Data: Right upper quadrant and epigastric pain, nausea and vomiting.  Pregnant with history of gallbladder disease.  LIMITED ABDOMINAL ULTRASOUND  Comparison:  CT 09/19/2012  Findings: The gallbladder is well distended and demonstrates multiple small mobile gallstones with the longest measurable measuring 9 mm. No associated gallbladder wall thickening or pericholecystic fluid is seen.  Evaluation for a sonographic Murphy's sign is negative.  The common bile duct has a caliber of 5.6 mm.  No choledocholithiasis is identified.  The liver parenchyma is homogeneous in echotexture with no focal parenchymal abnormality or signs of intrahepatic ductal dilatation.  No right upper quadrant fluid is seen.  IMPRESSION: Cholelithiasis with no signs of associated cholecystitis.   Original Report Authenticated By: Rhodia Albright, M.D.       Assessment/Plan Cholelithiasis during pregnancy with likely biliary colic, no evidence of cholecystitis- pain, N/V now resolved 1.  IVF, pain control, antiemetics 2.  Ambulating and SCD's 3.  Will recheck CBC in am, if trending down and pain, N/V resolved and tolerating PO can think about doing a lap chole as an outpatient after delivery 4.  If pain, N/V/pain worsens and unable to take PO then will likely need lap chole this admission.  She may need to be transferred to [facility] for surgery and be transferred back for baby monitoring 5.  Okay to start clears  Umbilical hernia - discussed with patient about getting it fixed as an outpatient Gravid uterus - 29weeks6days  Aris Georgia 05/14/2013, 2:39 PM Pager: [phone]

## 2013-05-14 NOTE — Progress Notes (Signed)
Agree with A&P of MD,PA. Patient is still a little tender in RUQ but soft and no guarding. Hopefully this represents some biliary colic and she will improve. Ideally she should have cholecystectomy sometime in the post-partum period. If she does not improve, may need to do this admit.

## 2013-05-14 NOTE — MAU Note (Addendum)
States she woke up this morning with sudden onset of upper abdominal pain then started vomiting. Pain intermittent with vomiting. No fever or chills, no diarrhea. States has hx of subchorionic hemorrhage and on U/S, saw "fluid in baby's kidneys." No bleeding or leaking.

## 2013-05-14 NOTE — H&P (Signed)
Caitlin Sullivan is a 22 y.o. G2P0010 at [redacted]w[redacted]d admitted for cholelithiasis.   0 Fetal presentation is unsure.  History of Present Illness:  Caitlin Sullivan is a 22 y.o. female G2P0010 at [redacted]w[redacted]d who presents today to the MAU for upper stomach pain with vomiting that has been going on since 6am.  Her PMH is significant for vaginal bleeding in pregnancy and hyperemesis.  FH is significant for gallbladder disease in her mother what occurred during pregnancy.  Upon waking, she had 9/10 RUQ stomach pains that made her very nauseous and causing her to vomit.  She has vomited 6-7 times this morning and everytime has been yellowish-greenish thin emesis.  She has prescription Phenergan and Zofran that she takes regularly for nausea, but hasn't taken any since 1300 yesterday.  She had a cheeseburger for dinner last night with no issues.  Her mother that she lives with has a cold currently.  She denies fever.   or any sick contacts. She denies any vaginal discharge or bleeding and feels baby move regularly.    Patient reports the fetal movement as active. Patient reports uterine contraction  activity as none. Patient reports  vaginal bleeding as none. Patient describes fluid per vagina as None.  Patient Active Problem List   Diagnosis Date Noted  . Suspected fetal anomaly, antepartum-right sided borderline renal pyelectasis 05/06/2013  . Second trimester bleeding 01/30/2013  . Hyperemesis gravidarum 01/07/2013  . Asthma 01/07/2013  . Supervision of normal first pregnancy 01/07/2013    OB History   Grav Para Term Preterm Abortions TAB SAB Ect Mult Living   2    1 1              Past Medical History   Diagnosis  Date   .  Umbilical hernia     .  Hyperemesis arising during pregnancy     .  Asthma        Past Surgical History   Procedure  Laterality  Date   .  No past surgeries       .  Pilonidal cyst / sinus excision          Family History   Problem  Relation  Age of Onset   .  Cancer  Mother      .  Hypertension  Mother     .  Diabetes  Father     .  Hypertension  Father     .  Hyperlipidemia  Father     .  Stroke  Father     .  Asthma  Brother       History   Substance Use Topics   .  Smoking status:  Former Smoker -- 0.25 packs/day       Types:  Cigarettes       Quit date:  11/03/2012   .  Smokeless tobacco:  Never Used   .  Alcohol Use:  No    Allergies: No Known Allergies   Social History: History   Social History  . Marital Status: Single    Spouse Name: N/A    Number of Children: N/A  . Years of Education: N/A   Social History Main Topics  . Smoking status: Former Smoker -- 0.25 packs/day    Types: Cigarettes    Quit date: 11/03/2012  . Smokeless tobacco: Never Used  . Alcohol Use: No  . Drug Use: No  . Sexually Active: Yes    Birth Control/ Protection: None   Other Topics  Concern  . None   Social History Narrative  . None   Review of Systems  Constitutional: Negative for fever and chills.  Eyes: Negative for blurred vision.  Respiratory: Negative for cough, hemoptysis, sputum production and shortness of breath.   Cardiovascular: Negative for chest pain, palpitations and claudication.  Gastrointestinal: Positive for nausea, vomiting and abdominal pain. Negative for heartburn, diarrhea and constipation.  Genitourinary: Negative for dysuria and urgency.  Skin: Negative for itching and rash.  Neurological: Positive for dizziness. Negative for headaches.     Vitals    Blood pressure 131/76, pulse 110, temperature 97.1 F (36.2 C), temperature source Oral, resp. rate 20, height 5\' 6"  (1.676 m), weight 99.338 kg (219 lb), last menstrual period 09/29/2012.  Physical Exam  Constitutional: She is oriented to person, place, and time. She appears well-developed and well-nourished.  She is vomiting, but in no distress  HENT:   Head: Normocephalic.  Cardiovascular: Normal rate and regular rhythm.  Exam reveals no gallop and no friction rub.    No  murmur heard. Respiratory: Breath sounds normal. She has no wheezes. She has no rales.  GI: Soft. Bowel sounds are normal. She exhibits no distension. There is tenderness.  Positive Murphy sign  Neurological: She is alert and oriented to person, place, and time.    Fetal Monitoring:Baseline: 125 bpm, Variability: Fair (1-6 bpm), Accelerations: Reactive and Decelerations: Absent  Labs:  Results for orders placed during the hospital encounter of 05/14/13 (from the past 24 hour(s))  URINALYSIS, ROUTINE W REFLEX MICROSCOPIC   Collection Time    05/14/13  9:35 AM      Result Value Range   Color, Urine YELLOW  YELLOW   APPearance CLEAR  CLEAR   Specific Gravity, Urine 1.020  1.005 - 1.030   pH 6.5  5.0 - 8.0   Glucose, UA NEGATIVE  NEGATIVE mg/dL   Hgb urine dipstick NEGATIVE  NEGATIVE   Bilirubin Urine NEGATIVE  NEGATIVE   Ketones, ur NEGATIVE  NEGATIVE mg/dL   Protein, ur NEGATIVE  NEGATIVE mg/dL   Urobilinogen, UA 0.2  0.0 - 1.0 mg/dL   Nitrite NEGATIVE  NEGATIVE   Leukocytes, UA NEGATIVE  NEGATIVE  COMPREHENSIVE METABOLIC PANEL   Collection Time    05/14/13 10:18 AM      Result Value Range   Sodium 136  135 - 145 mEq/L   Potassium 3.6  3.5 - 5.1 mEq/L   Chloride 104  96 - 112 mEq/L   CO2 23  19 - 32 mEq/L   Glucose, Bld 106 (*) 70 - 99 mg/dL   BUN 7  6 - 23 mg/dL   Creatinine, Ser 1.61 (*) 0.50 - 1.10 mg/dL   Calcium 9.2  8.4 - 09.6 mg/dL   Total Protein 6.7  6.0 - 8.3 g/dL   Albumin 2.9 (*) 3.5 - 5.2 g/dL   AST 15  0 - 37 U/L   ALT 10  0 - 35 U/L   Alkaline Phosphatase 91  39 - 117 U/L   Total Bilirubin 0.2 (*) 0.3 - 1.2 mg/dL   GFR calc non Af Amer >90  >90 mL/min   GFR calc Af Amer >90  >90 mL/min  CBC WITH DIFFERENTIAL   Collection Time    05/14/13 10:18 AM      Result Value Range   WBC 15.4 (*) 4.0 - 10.5 K/uL   RBC 3.80 (*) 3.87 - 5.11 MIL/uL   Hemoglobin 11.3 (*) 12.0 - 15.0 g/dL  HCT 33.1 (*) 36.0 - 46.0 %   MCV 87.1  78.0 - 100.0 fL   MCH 29.7  26.0 -  34.0 pg   MCHC 34.1  30.0 - 36.0 g/dL   RDW 96.0  45.4 - 09.8 %   Platelets 268  150 - 400 K/uL   Neutrophils Relative % 77  43 - 77 %   Neutro Abs 11.9 (*) 1.7 - 7.7 K/uL   Lymphocytes Relative 15  12 - 46 %   Lymphs Abs 2.2  0.7 - 4.0 K/uL   Monocytes Relative 7  3 - 12 %   Monocytes Absolute 1.1 (*) 0.1 - 1.0 K/uL   Eosinophils Relative 1  0 - 5 %   Eosinophils Absolute 0.1  0.0 - 0.7 K/uL   Basophils Relative 0  0 - 1 %   Basophils Absolute 0.0  0.0 - 0.1 K/uL  LIPASE, BLOOD   Collection Time    05/14/13 10:18 AM      Result Value Range   Lipase 20  11 - 59 U/L    Imaging Studies:  US Abdomen Limited Ruq  05/14/2013   *RADIOLOGY REPORT*  Clinical Data: Right upper quadrant and epigastric pain, nausea and vomiting.  Pregnant with history of gallbladder disease.  LIMITED ABDOMINAL ULTRASOUND  Comparison:  CT 09/19/2012  Findings: The gallbladder is well distended and demonstrates multiple small mobile gallstones with the longest measurable measuring 9 mm. No associated gallbladder wall thickening or pericholecystic fluid is seen.  Evaluation for a sonographic Murphy's sign is negative.  The common bile duct has a caliber of 5.6 mm.  No choledocholithiasis is identified.  The liver parenchyma is homogeneous in echotexture with no focal parenchymal abnormality or signs of intrahepatic ductal dilatation.  No right upper quadrant fluid is seen.  IMPRESSION: Cholelithiasis with no signs of associated cholecystitis.   Original Report Authenticated By: Rhodia Albright, M.D.   Prescriptions prior to admission   Medication  Sig  Dispense  Refill   .  albuterol (PROVENTIL HFA;VENTOLIN HFA) 108 (90 BASE) MCG/ACT inhaler  Inhale 1-2 puffs into the lungs every 6 (six) hours as needed. For wheezing or shortness of breath         .  ondansetron (ZOFRAN-ODT) 8 MG disintegrating tablet  DISSOLVE ONE TABLET BY MOUTH EVERY 8 HOURS AS NEEDED FOR NAUSEA   20 tablet   0   .  promethazine (PHENERGAN) 25 MG  tablet  Take 1 tablet (25 mg total) by mouth every 6 (six) hours as needed. nausea   30 tablet   6   I have reviewed the patient's current medications.   ASSESSMENT/PLAN: .ager G2P0010 at [redacted]w[redacted]d with - Cholelithiasis:  General surgery consulted (Dr. Jamey Ripa [facility]). Given increased WBC recommend 24 hour obs and repeat CBC tomorrow. Will come see her and decide about timing of surgery. - IUP - FHTs reassuring, monitor q shift - Pain control, nausea control, advance diet cautiously, low fat   Anna Genre 05/14/2013, 1:54 PM        I saw and examined patient and agree with above student note.  I reviewed history, imaging, labs, and vitals. I personally reviewed the fetal heart tracing, and it is reactive (appropriate for gestation). Napoleon Form, MD

## 2013-05-15 ENCOUNTER — Other Ambulatory Visit (HOSPITAL_COMMUNITY): Payer: Self-pay | Admitting: Medical

## 2013-05-15 DIAGNOSIS — K8020 Calculus of gallbladder without cholecystitis without obstruction: Secondary | ICD-10-CM

## 2013-05-15 LAB — CBC
HCT: 30.2 % — ABNORMAL LOW (ref 36.0–46.0)
MCH: 29.9 pg (ref 26.0–34.0)
MCV: 87.8 fL (ref 78.0–100.0)
Platelets: 249 10*3/uL (ref 150–400)
RBC: 3.44 MIL/uL — ABNORMAL LOW (ref 3.87–5.11)
RDW: 13.1 % (ref 11.5–15.5)

## 2013-05-15 LAB — ABO/RH: ABO/RH(D): O POS

## 2013-05-15 MED ORDER — OXYCODONE-ACETAMINOPHEN 5-325 MG PO TABS: 1.0000 | ORAL_TABLET | ORAL | Status: DC | PRN

## 2013-05-15 MED ORDER — HYDROCODONE-ACETAMINOPHEN 5-325 MG PO TABS: 1.0000 | ORAL_TABLET | Freq: Four times a day (QID) | ORAL | Status: DC | PRN

## 2013-05-15 MED ORDER — OXYCODONE-ACETAMINOPHEN 5-325 MG PO TABS: 1.0000 | ORAL_TABLET | Freq: Four times a day (QID) | ORAL | Status: DC | PRN

## 2013-05-15 NOTE — Progress Notes (Signed)
Pt. Ready for discharge. All instructions reviewed and all questions answered. Belongings are with the patient. Her mom is driving her home.

## 2013-05-15 NOTE — Discharge Summary (Signed)
Physician Discharge Summary  Patient ID: Caitlin Sullivan MRN: [id number] DOB/AGE: July 18, 1991 21 y.o.  Admit date: 05/14/2013 Discharge date: 05/15/2013  Admission Diagnoses:[redacted] weeks gestation, cholelithisis  Discharge Diagnoses: same Active Problems:   Cholelithiasis complicating pregnancy in third trimester, antepartum   Discharged Condition: fair  Hospital Course: nausea and pain improved  Consults: general surgery  Significant Diagnostic Studies: radiology: Ultrasound: RUQ sonogram-cholelithiasis  Treatments: IV hydration and analgesia: Dilaudid and percocet  Discharge Exam: Blood pressure 126/68, pulse 87, temperature 98.3 F (36.8 C), temperature source Oral, resp. rate 20, height 5\' 6"  (1.676 m), weight 219 lb (99.338 kg), last menstrual period 09/29/2012. General appearance: alert, cooperative and mild distress GI: gravid,mild RUQ tenderness  Disposition: 01-Home or Self Care   Future Appointments Provider Department Dept Phone   05/20/2013 10:00 AM Catalina Antigua, MD [facility] [phone]   05/28/2013 2:15 PM [facility] [phone]       Medication List    TAKE these medications       albuterol 108 (90 BASE) MCG/ACT inhaler  Commonly known as:  PROVENTIL HFA;VENTOLIN HFA  Inhale 1-2 puffs into the lungs every 6 (six) hours as needed. For wheezing or shortness of breath     ondansetron 8 MG disintegrating tablet  Commonly known as:  ZOFRAN-ODT  DISSOLVE ONE TABLET BY MOUTH EVERY 8 HOURS AS NEEDED FOR NAUSEA     oxyCODONE-acetaminophen 5-325 MG per tablet  Commonly known as:  PERCOCET/ROXICET  Take 1-2 tablets by mouth every 4 (four) hours as needed.     promethazine 25 MG tablet  Commonly known as:  PHENERGAN  Take 1 tablet (25 mg total) by mouth every 6 (six) hours as needed. nausea           Follow-up Information   Follow up with [facility] In 5 days.       Signed: Tahja Liao 05/15/2013, 3:26 PM

## 2013-05-15 NOTE — Progress Notes (Signed)
Patient ID: Caitlin Sullivan, female   DOB: January 20, 1991, 22 y.o.   MRN: [id number] FACULTY PRACTICE ANTEPARTUM(COMPREHENSIVE) NOTE  Caitlin Sullivan is a 22 y.o. G2P0010 at [redacted]w[redacted]d by best clinical estimate who is admitted for cholelithiasis.   Fetal presentation is unsure. Length of Stay:  1  Days  Subjective: Hungry, still some RUQ pain Patient reports the fetal movement as active. Patient reports uterine contraction  activity as none. Patient reports  vaginal bleeding as none. Patient describes fluid per vagina as None.  Vitals:  Blood pressure 112/57, pulse 70, temperature 97.6 F (36.4 C), temperature source Oral, resp. rate 20, height 5\' 6"  (1.676 m), weight 219 lb (99.338 kg), last menstrual period 09/29/2012. Physical Examination:  General appearance - alert, well appearing, and in no distress Heart - normal rate and regular rhythm Abdomen - soft, nontender, nondistended Fundal Height:  size equals dates and mild RUQ tenderness Cervical Exam: Not evaluated Extremities: extremities normal Membranes:intact  Fetal Monitoring:     Labs:  Results for orders placed during the hospital encounter of 05/14/13 (from the past 24 hour(s))  COMPREHENSIVE METABOLIC PANEL   Collection Time    05/14/13 10:18 AM      Result Value Range   Sodium 136  135 - 145 mEq/L   Potassium 3.6  3.5 - 5.1 mEq/L   Chloride 104  96 - 112 mEq/L   CO2 23  19 - 32 mEq/L   Glucose, Bld 106 (*) 70 - 99 mg/dL   BUN 7  6 - 23 mg/dL   Creatinine, Ser 4.09 (*) 0.50 - 1.10 mg/dL   Calcium 9.2  8.4 - 81.1 mg/dL   Total Protein 6.7  6.0 - 8.3 g/dL   Albumin 2.9 (*) 3.5 - 5.2 g/dL   AST 15  0 - 37 U/L   ALT 10  0 - 35 U/L   Alkaline Phosphatase 91  39 - 117 U/L   Total Bilirubin 0.2 (*) 0.3 - 1.2 mg/dL   GFR calc non Af Amer >90  >90 mL/min   GFR calc Af Amer >90  >90 mL/min  CBC WITH DIFFERENTIAL   Collection Time    05/14/13 10:18 AM      Result Value Range   WBC 15.4 (*) 4.0 - 10.5 K/uL   RBC 3.80 (*) 3.87 -  5.11 MIL/uL   Hemoglobin 11.3 (*) 12.0 - 15.0 g/dL   HCT 91.4 (*) 78.2 - 95.6 %   MCV 87.1  78.0 - 100.0 fL   MCH 29.7  26.0 - 34.0 pg   MCHC 34.1  30.0 - 36.0 g/dL   RDW 21.3  08.6 - 57.8 %   Platelets 268  150 - 400 K/uL   Neutrophils Relative % 77  43 - 77 %   Neutro Abs 11.9 (*) 1.7 - 7.7 K/uL   Lymphocytes Relative 15  12 - 46 %   Lymphs Abs 2.2  0.7 - 4.0 K/uL   Monocytes Relative 7  3 - 12 %   Monocytes Absolute 1.1 (*) 0.1 - 1.0 K/uL   Eosinophils Relative 1  0 - 5 %   Eosinophils Absolute 0.1  0.0 - 0.7 K/uL   Basophils Relative 0  0 - 1 %   Basophils Absolute 0.0  0.0 - 0.1 K/uL  LIPASE, BLOOD   Collection Time    05/14/13 10:18 AM      Result Value Range   Lipase 20  11 - 59 U/L  TYPE AND SCREEN  Collection Time    05/14/13 11:20 PM      Result Value Range   ABO/RH(D) O POS     Antibody Screen NEG     Sample Expiration 05/17/2013    CBC   Collection Time    05/15/13  7:05 AM      Result Value Range   WBC 12.3 (*) 4.0 - 10.5 K/uL   RBC 3.44 (*) 3.87 - 5.11 MIL/uL   Hemoglobin 10.3 (*) 12.0 - 15.0 g/dL   HCT 11.9 (*) 14.7 - 82.9 %   MCV 87.8  78.0 - 100.0 fL   MCH 29.9  26.0 - 34.0 pg   MCHC 34.1  30.0 - 36.0 g/dL   RDW 56.2  13.0 - 86.5 %   Platelets 249  150 - 400 K/uL    Imaging Studies:      Currently EPIC will not allow sonographic studies to automatically populate into notes.  In the meantime, copy and paste results into note or free text.  Medications:  Scheduled . docusate sodium  100 mg Oral Daily  . lactated ringers  1,000 mL Intravenous Once  . prenatal multivitamin  1 tablet Oral Q1200   I have reviewed the patient's current medications.  ASSESSMENT: Patient Active Problem List   Diagnosis Date Noted  . Suspected fetal anomaly, antepartum-right sided borderline renal pyelectasis 05/06/2013  . Second trimester bleeding 01/30/2013  . Hyperemesis gravidarum 01/07/2013  . Asthma 01/07/2013  . Supervision of normal first pregnancy  01/07/2013  Cholelithiasis, WBC improved  PLAN: F/U by genl surgery  Caitlin Sullivan 05/15/2013,9:47 AM

## 2013-05-15 NOTE — Progress Notes (Signed)
Patient ID: Caitlin Sullivan, female   DOB: [birth year], 21 y.o.   MRN: [id number]  General Surgery - [facility] - Progress Note  Subjective: Patient at [facility].  Less pain today - more "sore".  No nausea or emesis this AM.  Wants to try to eat.  Objective: Vital signs in last 24 hours: Temp:  [97.6 F (36.4 C)-98.2 F (36.8 C)] 97.6 F (36.4 C) (05/24 0827) Pulse Rate:  [70-92] 70 (05/24 0827) Resp:  [16-20] 20 (05/24 0827) BP: (94-121)/(42-67) 112/57 mmHg (05/24 0827)    Intake/Output from previous day:    Exam: HEENT - clear, not icteric Neck - soft Chest - clear bilaterally Cor - RRR, no murmur Abd - gravid, uterus about 10-12 cm above umbilicus; BS present; mild tenderness RUQ, no mass Ext - no significant edema Neuro - grossly intact, no focal deficits  Lab Results:   Recent Labs  05/14/13 1018 05/15/13 0705  WBC 15.4* 12.3*  HGB 11.3* 10.3*  HCT 33.1* 30.2*  PLT 268 249     Recent Labs  05/14/13 1018  NA 136  K 3.6  CL 104  CO2 23  GLUCOSE 106*  BUN 7  CREATININE 0.41*  CALCIUM 9.2    Studies/Results: US Abdomen Limited Ruq  05/14/2013   *RADIOLOGY REPORT*  Clinical Data: Right upper quadrant and epigastric pain, nausea and vomiting.  Pregnant with history of gallbladder disease.  LIMITED ABDOMINAL ULTRASOUND  Comparison:  CT 09/19/2012  Findings: The gallbladder is well distended and demonstrates multiple small mobile gallstones with the longest measurable measuring 9 mm. No associated gallbladder wall thickening or pericholecystic fluid is seen.  Evaluation for a sonographic Murphy's sign is negative.  The common bile duct has a caliber of 5.6 mm.  No choledocholithiasis is identified.  The liver parenchyma is homogeneous in echotexture with no focal parenchymal abnormality or signs of intrahepatic ductal dilatation.  No right upper quadrant fluid is seen.  IMPRESSION: Cholelithiasis with no signs of associated cholecystitis.    Original Report Authenticated By: Rhodia Albright, M.D.    Assessment / Plan: 1.  Symptomatic cholelithiasis in [redacted] week pregnant patient  Trial of regular low fat diet  Pain Rx for intermittent colic  WBC normalizing, no fever, doubt acute or subacute cholecystitis  LFT's normal  Will follow with you  Hopefully can go to term and have cholecystectomy electively 6-8 weeks after delivery  Discussed on ward with Dr. Devoria Glassing, MD, [facility] Office: [phone]  05/15/2013

## 2013-05-16 NOTE — H&P (Signed)
Attestation of Attending Supervision of Advanced Practitioner (CNM/NP): Evaluation and management procedures were performed by the Advanced Practitioner under my supervision and collaboration.  I have reviewed the Advanced Practitioner's note and chart, and I agree with the management and plan.  Hosteen Kienast 05/16/2013 8:19 AM   

## 2013-05-18 ENCOUNTER — Telehealth: Payer: Self-pay | Admitting: *Deleted

## 2013-05-18 DIAGNOSIS — O219 Vomiting of pregnancy, unspecified: Secondary | ICD-10-CM

## 2013-05-18 MED ORDER — ONDANSETRON HCL 8 MG PO TABS: 8.0000 mg | ORAL_TABLET | Freq: Three times a day (TID) | ORAL | Status: DC | PRN

## 2013-05-18 NOTE — Telephone Encounter (Signed)
Patient called requesting rx for zofran. I called Dr. Marice Potter and she gave verbal order for 8mg  zofran. Pt informed.

## 2013-05-20 ENCOUNTER — Encounter: Payer: Self-pay | Admitting: Obstetrics and Gynecology

## 2013-05-28 ENCOUNTER — Ambulatory Visit (HOSPITAL_COMMUNITY)
Admission: RE | Admit: 2013-05-28 | Discharge: 2013-05-28 | Disposition: A | Discharge status: Home / Self Care | Payer: Medicaid Other | Source: Ambulatory Visit | Attending: Obstetrics and Gynecology | Admitting: Obstetrics and Gynecology

## 2013-05-28 DIAGNOSIS — Z3689 Encounter for other specified antenatal screening: Secondary | ICD-10-CM | POA: Insufficient documentation

## 2013-05-28 DIAGNOSIS — O358XX0 Maternal care for other (suspected) fetal abnormality and damage, not applicable or unspecified: Secondary | ICD-10-CM

## 2013-05-28 NOTE — Progress Notes (Signed)
Caitlin Sullivan was seen for ultrasound appointment today.  Please see [facility]OB[facility] report for details.

## 2013-05-31 NOTE — Addendum Note (Signed)
Encounter addended by: Ty Hilts, RN on: 05/31/2013  8:49 AM<BR>     Documentation filed: Charges VN, OB Prenatal Vitals, Episodes

## 2013-06-03 ENCOUNTER — Encounter: Payer: Self-pay | Admitting: Obstetrics and Gynecology

## 2013-06-03 ENCOUNTER — Encounter (HOSPITAL_COMMUNITY): Payer: Self-pay | Admitting: *Deleted

## 2013-06-03 ENCOUNTER — Ambulatory Visit (INDEPENDENT_AMBULATORY_CARE_PROVIDER_SITE_OTHER): Payer: Medicaid Other | Admitting: Obstetrics and Gynecology

## 2013-06-03 ENCOUNTER — Inpatient Hospital Stay (HOSPITAL_COMMUNITY)
Admission: AD | Admit: 2013-06-03 | Discharge: 2013-06-03 | Disposition: A | Discharge status: Home / Self Care | Payer: Medicaid Other | Source: Ambulatory Visit | Attending: Family Medicine | Admitting: Family Medicine

## 2013-06-03 VITALS — BP 151/87 | Temp 97.6°F | Wt 222.3 lb

## 2013-06-03 DIAGNOSIS — O163 Unspecified maternal hypertension, third trimester: Secondary | ICD-10-CM

## 2013-06-03 DIAGNOSIS — O212 Late vomiting of pregnancy: Secondary | ICD-10-CM | POA: Insufficient documentation

## 2013-06-03 DIAGNOSIS — O4700 False labor before 37 completed weeks of gestation, unspecified trimester: Secondary | ICD-10-CM | POA: Insufficient documentation

## 2013-06-03 DIAGNOSIS — O9989 Other specified diseases and conditions complicating pregnancy, childbirth and the puerperium: Secondary | ICD-10-CM | POA: Insufficient documentation

## 2013-06-03 DIAGNOSIS — R030 Elevated blood-pressure reading, without diagnosis of hypertension: Secondary | ICD-10-CM

## 2013-06-03 DIAGNOSIS — K8020 Calculus of gallbladder without cholecystitis without obstruction: Secondary | ICD-10-CM | POA: Insufficient documentation

## 2013-06-03 DIAGNOSIS — O26619 Liver and biliary tract disorders in pregnancy, unspecified trimester: Secondary | ICD-10-CM

## 2013-06-03 DIAGNOSIS — R109 Unspecified abdominal pain: Secondary | ICD-10-CM | POA: Insufficient documentation

## 2013-06-03 DIAGNOSIS — Z3400 Encounter for supervision of normal first pregnancy, unspecified trimester: Secondary | ICD-10-CM

## 2013-06-03 DIAGNOSIS — O99891 Other specified diseases and conditions complicating pregnancy: Secondary | ICD-10-CM | POA: Insufficient documentation

## 2013-06-03 DIAGNOSIS — R81 Glycosuria: Secondary | ICD-10-CM

## 2013-06-03 HISTORY — DX: Calculus of gallbladder without cholecystitis without obstruction: K80.20

## 2013-06-03 LAB — COMPREHENSIVE METABOLIC PANEL
ALT: 12 U/L (ref 0–35)
AST: 14 U/L (ref 0–37)
CO2: 21 mEq/L (ref 19–32)
Chloride: 103 mEq/L (ref 96–112)
Creatinine, Ser: 0.37 mg/dL — ABNORMAL LOW (ref 0.50–1.10)
GFR calc Af Amer: >90 mL/min (ref >90)
GFR calc non Af Amer: >90 mL/min (ref >90)
Glucose, Bld: 84 mg/dL (ref 70–99)
Sodium: 134 mEq/L — ABNORMAL LOW (ref 135–145)
Total Bilirubin: 0.2 mg/dL — ABNORMAL LOW (ref 0.3–1.2)

## 2013-06-03 LAB — POCT URINALYSIS DIP (DEVICE)
Bilirubin Urine: NEGATIVE
Glucose, UA: 100 mg/dL — AB
Ketones, ur: NEGATIVE mg/dL
Nitrite: NEGATIVE
pH: 7 (ref 5.0–8.0)

## 2013-06-03 LAB — CBC
Hemoglobin: 11.3 g/dL — ABNORMAL LOW (ref 12.0–15.0)
MCV: 85.7 fL (ref 78.0–100.0)
Platelets: 288 10*3/uL (ref 150–400)
RBC: 3.91 MIL/uL (ref 3.87–5.11)
WBC: 13.5 10*3/uL — ABNORMAL HIGH (ref 4.0–10.5)

## 2013-06-03 MED ORDER — ONDANSETRON HCL 8 MG PO TABS: 8.0000 mg | ORAL_TABLET | Freq: Three times a day (TID) | ORAL | Status: DC | PRN

## 2013-06-03 NOTE — MAU Note (Signed)
Sent from clinic with elevated BP. Also states cervix was soft. FFN sent from MAU on arrival.

## 2013-06-03 NOTE — MAU Provider Note (Signed)
History     CSN: [id number]  Arrival date and time: 06/03/13 1428   First Provider Initiated Contact with Patient 06/03/13 1503      Chief Complaint  Patient presents with  . Hypertension   HPI  Caitlin Sullivan is a 22 y.o. G2P0010 at [redacted]w[redacted]d with cholelithiasis, hyperemesis gravidarum who presents from [facility] for evaluation of elevated BP.   Patient was seen in the clinic earlier today and had BP of 151/87, so she was sent to the MAU for further evaluation.  Patient denies headache or visual symptoms.  Patient does report some intermittent lower abdominal cramping which resolves spontaneously.   Patient also reports RUQ pain secondary to cholelithiasis.  Otherwise, patient is feeling well.    Of note, patient had elevated 1 hour GTT and is to followup tomorrow for 3 hour GTT.  ROS: No loss of fluid or vaginal bleeding.  Normal vaginal discharge.  No reported contractions.    Past Medical History  Diagnosis Date  . Umbilical hernia   . Hyperemesis arising during pregnancy   . Asthma     Past Surgical History  Procedure Laterality Date  . No past surgeries    . Pilonidal cyst / sinus excision      Family History  Problem Relation Age of Onset  . Cancer Mother   . Hypertension Mother   . Diabetes Father   . Hypertension Father   . Hyperlipidemia Father   . Stroke Father   . Asthma Brother     History  Substance Use Topics  . Smoking status: Former Smoker -- 0.25 packs/day    Types: Cigarettes    Quit date: 11/03/2012  . Smokeless tobacco: Never Used  . Alcohol Use: No    Allergies: No Known Allergies  Prescriptions prior to admission  Medication Sig Dispense Refill  . albuterol (PROVENTIL HFA;VENTOLIN HFA) 108 (90 BASE) MCG/ACT inhaler Inhale 1-2 puffs into the lungs every 6 (six) hours as needed. For wheezing or shortness of breath      . HYDROcodone-acetaminophen (NORCO/VICODIN) 5-325 MG per tablet Take 1 tablet by mouth every 6 (six) hours as needed  for pain.  30 tablet  0  . ondansetron (ZOFRAN) 8 MG tablet Take 8 mg by mouth every 8 (eight) hours as needed for nausea.      . ondansetron (ZOFRAN-ODT) 4 MG disintegrating tablet Take 4 mg by mouth every 8 (eight) hours as needed for nausea.      Marland Kitchen oxyCODONE-acetaminophen (PERCOCET/ROXICET) 5-325 MG per tablet Take 1-2 tablets by mouth every 6 (six) hours as needed for pain.  30 tablet  0  . promethazine (PHENERGAN) 25 MG tablet Take 1 tablet (25 mg total) by mouth every 6 (six) hours as needed. nausea  30 tablet  6    ROS Per HPI Physical Exam   Blood pressure 114/80, pulse 105, temperature 98.8 F (37.1 C), temperature source Oral, resp. rate 18, height 5\' 6"  (1.676 m), weight 101.152 kg (223 lb), last menstrual period 09/29/2012.  Physical Exam Gen: well appearing, sitting up, NAD. Heart: RRR.  Lungs: CTAB.  Abd: mildly tender to palpation in RUQ. No guarding or rebound. Ext: trace LE edema bilaterally. Neuro: no focal deficits.   FHR: baseline 135, mod variability, 15x15 accels, no decels Toco: Occasional periods of irritability noted.  No regular contractions.   MAU Course  Procedures  Assessment and Plan  Caitlin Sullivan is a 22 y.o. G2P0010 at [redacted]w[redacted]d who presents from clinic for evaluation of  elevated BP. - Will obtain CBC, CMP, and Urine protein/creatinine ratio - Also checking CBG given failed 1 hour - Will monitor closely during MAU - continuous fetal monitoring and toco  1600 - CBC unremarkbable with normal platelet count of 288.  Normal LFT's on CMP (AST/ALT 14/12). CBG 84.  Category 1 Fetal heart tracing.  Awaiting Urine protein/creatinine ratio  1620 - FFN collected in clinic = negative.  Patient eager to go home.  Will discharge home and will call if UPC is elevated.  Everlene Other 06/03/2013, 3:04 PM

## 2013-06-03 NOTE — Patient Instructions (Signed)

## 2013-06-03 NOTE — Progress Notes (Signed)
Pulse- 105  Edema-feet/hands  Pain-"lower cramps"  Vaginal discharge-clear, sometimes white Pt needs refill on Zofran

## 2013-06-03 NOTE — Progress Notes (Signed)
BP recheck: 145/87 Denies H/A, visual sx,. Check CBG: Lower abd crampy pain for a few days> cx post, closed 30/-3 vtx> fFN collected and will check EFM.   Also continues to have nausea and using Zofran and Phenergan. Denies RUQ pain.  Has not gotten 3 hr OGTT. 1 hr was 151 on 05/06/13.> check CBG. Get 3 hr tomorrow and PNV Monday. Discussed with Dr. Debroah Loop will send to MAU for further evaluation.  Vaginal discharge not irritative.

## 2013-06-04 ENCOUNTER — Other Ambulatory Visit: Payer: Medicaid Other

## 2013-06-04 DIAGNOSIS — R7309 Other abnormal glucose: Secondary | ICD-10-CM

## 2013-06-04 NOTE — MAU Provider Note (Signed)
I examined pt and agree with documentation above and resident plan of care. Caitlin Sullivan,Caitlin Sullivan  

## 2013-06-05 LAB — GLUCOSE TOLERANCE, 3 HOURS
Glucose Tolerance, 2 hour: 162 mg/dL (ref 70–164)
Glucose, GTT - 3 Hour: 104 mg/dL (ref 70–144)

## 2013-06-06 ENCOUNTER — Encounter (HOSPITAL_COMMUNITY): Payer: Self-pay | Admitting: *Deleted

## 2013-06-06 ENCOUNTER — Inpatient Hospital Stay (HOSPITAL_COMMUNITY)
Admission: AD | Admit: 2013-06-06 | Discharge: 2013-06-06 | Disposition: A | Discharge status: Home / Self Care | Payer: Medicaid Other | Source: Ambulatory Visit | Attending: Obstetrics & Gynecology | Admitting: Obstetrics & Gynecology

## 2013-06-06 DIAGNOSIS — N949 Unspecified condition associated with female genital organs and menstrual cycle: Secondary | ICD-10-CM

## 2013-06-06 DIAGNOSIS — R109 Unspecified abdominal pain: Secondary | ICD-10-CM

## 2013-06-06 DIAGNOSIS — O99891 Other specified diseases and conditions complicating pregnancy: Secondary | ICD-10-CM

## 2013-06-06 NOTE — MAU Note (Signed)
Pt G2 P0 at 33.1wks leaking small amt of clear fluid off and on since 0400.  Mild cramping, denies bleeding.

## 2013-06-06 NOTE — MAU Provider Note (Signed)
  History     CSN: [id number]  Arrival date and time: 06/06/13 2110   None     Chief Complaint  Patient presents with  . Rupture of Membranes   HPI Caitlin Sullivan is a 21yo G2P0010 at 33.1wks who presents for eval of ? leaking fluid. Denies ctx, bldg, N/V/D, H/A or dysuria. [facility] and her care has been remarkable for 1) resolved SCH 2) recent neg 3hr GTT after elevated glucola 3) renal pyelectasis at 28wks  OB History   Grav Para Term Preterm Abortions TAB SAB Ect Mult Living   2    1 1     0      Past Medical History  Diagnosis Date  . Umbilical hernia   . Hyperemesis arising during pregnancy   . Asthma   . Gall stones     Past Surgical History  Procedure Laterality Date  . No past surgeries    . Pilonidal cyst / sinus excision      Family History  Problem Relation Age of Onset  . Cancer Mother   . Hypertension Mother   . Diabetes Father   . Hypertension Father   . Hyperlipidemia Father   . Stroke Father   . Asthma Brother     History  Substance Use Topics  . Smoking status: Former Smoker -- 0.25 packs/day    Types: Cigarettes    Quit date: 11/03/2012  . Smokeless tobacco: Never Used  . Alcohol Use: No    Allergies: No Known Allergies  Prescriptions prior to admission  Medication Sig Dispense Refill  . albuterol (PROVENTIL HFA;VENTOLIN HFA) 108 (90 BASE) MCG/ACT inhaler Inhale 1-2 puffs into the lungs every 6 (six) hours as needed. For wheezing or shortness of breath      . HYDROcodone-acetaminophen (NORCO) 10-325 MG per tablet Take 1 tablet by mouth every 6 (six) hours as needed for pain.      Marland Kitchen ondansetron (ZOFRAN) 8 MG tablet Take 1 tablet (8 mg total) by mouth every 8 (eight) hours as needed for nausea.  20 tablet  0  . promethazine (PHENERGAN) 25 MG tablet Take 1 tablet (25 mg total) by mouth every 6 (six) hours as needed. nausea  30 tablet  6    ROS Physical Exam   Blood pressure 131/79, pulse 99, temperature 98.4 F (36.9  C), temperature source Oral, resp. rate 18, height 5\' 6"  (1.676 m), weight 102.876 kg (226 lb 12.8 oz), last menstrual period 09/29/2012.  Physical Exam  Constitutional: She is oriented to person, place, and time. She appears well-developed.  HENT:  Head: Normocephalic.  Neck: Normal range of motion.  Cardiovascular: Normal rate.   Respiratory: Effort normal.  GI:  FHR 125-130 +accels, no decels No ctx per toco  Genitourinary:  SSE sm white vag d/c, neg pool, neg fern Cx C/L/post/soft  Neurological: She is alert and oriented to person, place, and time.  Skin: Skin is warm and dry.  Psychiatric: She has a normal mood and affect. Her behavior is normal. Thought content normal.    MAU Course  Procedures    Assessment and Plan  IUP at 33.1wks Vaginal d/c  D/C home with PTL precautions F/U as scheduled 6/16 for clinic visit  Cam Hai 06/06/2013, 10:29 PM

## 2013-06-06 NOTE — MAU Provider Note (Signed)
Attestation of Attending Supervision of Advanced Practitioner (PA/CNM/NP): Evaluation and management procedures were performed by the Advanced Practitioner under my supervision and collaboration.  I have reviewed the Advanced Practitioner's note and chart, and I agree with the management and plan.  Chrissa Meetze, MD, FACOG Attending Obstetrician & Gynecologist Faculty Practice, [facility]  

## 2013-06-07 ENCOUNTER — Encounter: Payer: Medicaid Other | Admitting: Obstetrics and Gynecology

## 2013-06-21 ENCOUNTER — Encounter: Payer: Self-pay | Admitting: Advanced Practice Midwife

## 2013-06-21 ENCOUNTER — Ambulatory Visit (INDEPENDENT_AMBULATORY_CARE_PROVIDER_SITE_OTHER): Payer: Medicaid Other | Admitting: Advanced Practice Midwife

## 2013-06-21 VITALS — BP 127/84 | Temp 97.6°F | Wt 228.4 lb

## 2013-06-21 DIAGNOSIS — O26619 Liver and biliary tract disorders in pregnancy, unspecified trimester: Secondary | ICD-10-CM

## 2013-06-21 DIAGNOSIS — Z3400 Encounter for supervision of normal first pregnancy, unspecified trimester: Secondary | ICD-10-CM

## 2013-06-21 LAB — POCT URINALYSIS DIP (DEVICE)
Bilirubin Urine: NEGATIVE
Glucose, UA: 100 mg/dL — AB
Hgb urine dipstick: NEGATIVE
Ketones, ur: NEGATIVE mg/dL
Nitrite: NEGATIVE
Protein, ur: NEGATIVE mg/dL
Specific Gravity, Urine: 1.02 (ref 1.005–1.030)
Urobilinogen, UA: 0.2 mg/dL (ref 0.0–1.0)
pH: 7 (ref 5.0–8.0)

## 2013-06-21 MED ORDER — OMEPRAZOLE 20 MG PO CPDR: 20.0000 mg | DELAYED_RELEASE_CAPSULE | Freq: Two times a day (BID) | ORAL | Status: DC

## 2013-06-21 NOTE — Progress Notes (Signed)
Reviewed 3 hr GTT normal. Refilled omeprazole and zofran. Ready to deliver, tired of being pregnant.  Cultures and GBS next week.

## 2013-06-21 NOTE — Patient Instructions (Signed)

## 2013-06-21 NOTE — Progress Notes (Signed)
Pulse: 100 Needs rx for omeprazole.  Pt reports cramping and pain when she urinates at times.

## 2013-06-30 ENCOUNTER — Ambulatory Visit (INDEPENDENT_AMBULATORY_CARE_PROVIDER_SITE_OTHER): Payer: Medicaid Other | Admitting: Obstetrics and Gynecology

## 2013-06-30 VITALS — BP 131/87 | Temp 97.8°F | Wt 229.1 lb

## 2013-06-30 DIAGNOSIS — O2243 Hemorrhoids in pregnancy, third trimester: Secondary | ICD-10-CM

## 2013-06-30 DIAGNOSIS — Z3403 Encounter for supervision of normal first pregnancy, third trimester: Secondary | ICD-10-CM

## 2013-06-30 DIAGNOSIS — Z3493 Encounter for supervision of normal pregnancy, unspecified, third trimester: Secondary | ICD-10-CM

## 2013-06-30 DIAGNOSIS — O210 Mild hyperemesis gravidarum: Secondary | ICD-10-CM

## 2013-06-30 DIAGNOSIS — O228X9 Other venous complications in pregnancy, unspecified trimester: Secondary | ICD-10-CM

## 2013-06-30 LAB — COMPREHENSIVE METABOLIC PANEL
Albumin: 3.5 g/dL (ref 3.5–5.2)
Alkaline Phosphatase: 158 U/L — ABNORMAL HIGH (ref 39–117)
BUN: 5 mg/dL — ABNORMAL LOW (ref 6–23)
Calcium: 9.2 mg/dL (ref 8.4–10.5)
Glucose, Bld: 82 mg/dL (ref 70–99)
Potassium: 4.2 mEq/L (ref 3.5–5.3)

## 2013-06-30 LAB — CBC
HCT: 35.1 % — ABNORMAL LOW (ref 36.0–46.0)
Hemoglobin: 11.5 g/dL — ABNORMAL LOW (ref 12.0–15.0)
MCHC: 32.8 g/dL (ref 30.0–36.0)
MCV: 83.8 fL (ref 78.0–100.0)

## 2013-06-30 LAB — OB RESULTS CONSOLE GC/CHLAMYDIA
Chlamydia: NEGATIVE
Gonorrhea: NEGATIVE

## 2013-06-30 LAB — POCT URINALYSIS DIP (DEVICE)
Glucose, UA: NEGATIVE mg/dL
Hgb urine dipstick: NEGATIVE
Leukocytes, UA: NEGATIVE
Nitrite: NEGATIVE
Urobilinogen, UA: 0.2 mg/dL (ref 0.0–1.0)

## 2013-06-30 NOTE — Progress Notes (Signed)
No H/A, visual sx, abd pain x occ B-H. Repeat BP: 131/87. But had another elevation at 32 wk so will get baseline labs and BP check Fri.  Still nausea, no vomiting on meds.

## 2013-06-30 NOTE — Patient Instructions (Addendum)
Etonogestrel implant What is this medicine? ETONOGESTREL is a contraceptive (birth control) device. It is used to prevent pregnancy. It can be used for up to 3 years. This medicine may be used for other purposes; ask your health care provider or pharmacist if you have questions. What should I tell my health care provider before I take this medicine? They need to know if you have any of these conditions: -abnormal vaginal bleeding -blood vessel disease or blood clots -cancer of the breast, cervix, or liver -depression -diabetes -gallbladder disease -headaches -heart disease or recent heart attack -high blood pressure -high cholesterol -kidney disease -liver disease -renal disease -seizures -tobacco smoker -an unusual or allergic reaction to etonogestrel, other hormones, anesthetics or antiseptics, medicines, foods, dyes, or preservatives -pregnant or trying to get pregnant -breast-feeding How should I use this medicine? This device is inserted just under the skin on the inner side of your upper arm by a health care professional. Talk to your pediatrician regarding the use of this medicine in children. Special care may be needed. Overdosage: If you think you've taken too much of this medicine contact a poison control center or emergency room at once. Overdosage: If you think you have taken too much of this medicine contact a poison control center or emergency room at once. NOTE: This medicine is only for you. Do not share this medicine with others. What if I miss a dose? This does not apply. What may interact with this medicine? Do not take this medicine with any of the following medications: -amprenavir -bosentan -fosamprenavir This medicine may also interact with the following medications: -barbiturate medicines for inducing sleep or treating seizures -certain medicines for fungal infections like ketoconazole and itraconazole -griseofulvin -medicines to treat seizures like  carbamazepine, felbamate, oxcarbazepine, phenytoin, topiramate -modafinil -phenylbutazone -rifampin -some medicines to treat HIV infection like atazanavir, indinavir, lopinavir, nelfinavir, tipranavir, ritonavir -St. John's wort This list may not describe all possible interactions. Give your health care provider a list of all the medicines, herbs, non-prescription drugs, or dietary supplements you use. Also tell them if you smoke, drink alcohol, or use illegal drugs. Some items may interact with your medicine. What should I watch for while using this medicine? This product does not protect you against HIV infection (AIDS) or other sexually transmitted diseases. You should be able to feel the implant by pressing your fingertips over the skin where it was inserted. Tell your doctor if you cannot feel the implant. What side effects may I notice from receiving this medicine? Side effects that you should report to your doctor or health care professional as soon as possible: -allergic reactions like skin rash, itching or hives, swelling of the face, lips, or tongue -breast lumps -changes in vision -confusion, trouble speaking or understanding -dark urine -depressed mood -general ill feeling or flu-like symptoms -light-colored stools -loss of appetite, nausea -right upper belly pain -severe headaches -severe pain, swelling, or tenderness in the abdomen -shortness of breath, chest pain, swelling in a leg -signs of pregnancy -sudden numbness or weakness of the face, arm or leg -trouble walking, dizziness, loss of balance or coordination -unusual vaginal bleeding, discharge -unusually weak or tired -yellowing of the eyes or skin Side effects that usually do not require medical attention (Report these to your doctor or health care professional if they continue or are bothersome.): -acne -breast pain -changes in weight -cough -fever or chills -headache -irregular menstrual  bleeding -itching, burning, and vaginal discharge -pain or difficulty passing urine -sore throat This   list may not describe all possible side effects. Call your doctor for medical advice about side effects. You may report side effects to FDA at 1-800-FDA-1088. Where should I keep my medicine? This drug is given in a hospital or clinic and will not be stored at home. NOTE: This sheet is a summary. It may not cover all possible information. If you have questions about this medicine, talk to your doctor, pharmacist, or health care provider.  2013, [employer]vier/Gold Standard. (09/01/2009 3:54:17 PM) Hemorrhoids Hemorrhoids are swollen veins around the rectum or anus. There are two types of hemorrhoids:   Internal hemorrhoids. These occur in the veins just inside the rectum. They may poke through to the outside and become irritated and painful.  External hemorrhoids. These occur in the veins outside the anus and can be felt as a painful swelling or hard lump near the anus. CAUSES  Pregnancy.   Obesity.   Constipation or diarrhea.   Straining to have a bowel movement.   Sitting for long periods on the toilet.  Heavy lifting or other activity that caused you to strain.  Anal intercourse. SYMPTOMS   Pain.   Anal itching or irritation.   Rectal bleeding.   Fecal leakage.   Anal swelling.   One or more lumps around the anus.  DIAGNOSIS  Your caregiver may be able to diagnose hemorrhoids by visual examination. Other examinations or tests that may be performed include:   Examination of the rectal area with a gloved hand (digital rectal exam).   Examination of anal canal using a small tube (scope).   A blood test if you have lost a significant amount of blood.  A test to look inside the colon (sigmoidoscopy or colonoscopy). TREATMENT Most hemorrhoids can be treated at home. However, if symptoms do not seem to be getting better or if you have a lot of rectal bleeding,  your caregiver may perform a procedure to help make the hemorrhoids get smaller or remove them completely. Possible treatments include:   Placing a rubber band at the base of the hemorrhoid to cut off the circulation (rubber band ligation).   Injecting a chemical to shrink the hemorrhoid (sclerotherapy).   Using a tool to burn the hemorrhoid (infrared light therapy).   Surgically removing the hemorrhoid (hemorrhoidectomy).   Stapling the hemorrhoid to block blood flow to the tissue (hemorrhoid stapling).  HOME CARE INSTRUCTIONS   Eat foods with fiber, such as whole grains, beans, nuts, fruits, and vegetables. Ask your doctor about taking products with added fiber in them (fibersupplements).  Increase fluid intake. Drink enough water and fluids to keep your urine clear or pale yellow.   Exercise regularly.   Go to the bathroom when you have the urge to have a bowel movement. Do not wait.   Avoid straining to have bowel movements.   Keep the anal area dry and clean. Use wet toilet paper or moist towelettes after a bowel movement.   Medicated creams and suppositories may be used or applied as directed.   Only take over-the-counter or prescription medicines as directed by your caregiver.   Take warm sitz baths for 15 20 minutes, 3 4 times a day to ease pain and discomfort.   Place ice packs on the hemorrhoids if they are tender and swollen. Using ice packs between sitz baths may be helpful.   Put ice in a plastic bag.   Place a towel between your skin and the bag.   Leave the ice on  for 15 20 minutes, 3 4 times a day.   Do not use a donut-shaped pillow or sit on the toilet for long periods. This increases blood pooling and pain.  SEEK MEDICAL CARE IF:  You have increasing pain and swelling that is not controlled by treatment or medicine.  You have uncontrolled bleeding.  You have difficulty or you are unable to have a bowel movement.  You have pain or  inflammation outside the area of the hemorrhoids. MAKE SURE YOU:  Understand these instructions.  Will watch your condition.  Will get help right away if you are not doing well or get worse. Document Released: 12/06/2000 Document Revised: 11/25/2012 Document Reviewed: 10/13/2012 [facility] Patient Information 2014 [place], [place].

## 2013-06-30 NOTE — Addendum Note (Signed)
Addended by: Franchot Mimes on: 06/30/2013 02:44 PM   Modules accepted: Orders

## 2013-06-30 NOTE — Progress Notes (Signed)
Pulse- 98  Edema-feet/hands  Pain/pressure-cramps Pt reports feeling more nausous now and night when tries to get out of bed her knees hurt

## 2013-07-01 LAB — GC/CHLAMYDIA PROBE AMP
CT Probe RNA: NEGATIVE
GC Probe RNA: NEGATIVE

## 2013-07-02 ENCOUNTER — Ambulatory Visit (INDEPENDENT_AMBULATORY_CARE_PROVIDER_SITE_OTHER): Payer: Medicaid Other

## 2013-07-02 VITALS — BP 137/84 | HR 100 | Wt 230.9 lb

## 2013-07-02 DIAGNOSIS — R030 Elevated blood-pressure reading, without diagnosis of hypertension: Secondary | ICD-10-CM

## 2013-07-02 NOTE — Progress Notes (Signed)
Pt here for BP check.  BP stable.  Per Wynelle Bourgeois, CNM informed pt that we will continue to monitor her BP's during her prenatal visits.  Next appt is July 16 @ 0900.  Pt stated understanding.

## 2013-07-03 LAB — CREATININE CLEARANCE, URINE, 24 HOUR
Creatinine, Urine: 104.1 mg/dL
Creatinine: 0.46 mg/dL — ABNORMAL LOW (ref 0.50–1.10)

## 2013-07-03 LAB — PROTEIN, URINE, 24 HOUR: Protein, 24H Urine: 68 mg/d (ref 50–100)

## 2013-07-03 LAB — CULTURE, BETA STREP (GROUP B ONLY)

## 2013-07-03 LAB — OB RESULTS CONSOLE GBS: GBS: NEGATIVE

## 2013-07-07 ENCOUNTER — Ambulatory Visit (INDEPENDENT_AMBULATORY_CARE_PROVIDER_SITE_OTHER): Payer: Medicaid Other | Admitting: Advanced Practice Midwife

## 2013-07-07 VITALS — BP 113/81 | Temp 97.0°F | Wt 232.6 lb

## 2013-07-07 DIAGNOSIS — Z3402 Encounter for supervision of normal first pregnancy, second trimester: Secondary | ICD-10-CM

## 2013-07-07 DIAGNOSIS — O358XX0 Maternal care for other (suspected) fetal abnormality and damage, not applicable or unspecified: Secondary | ICD-10-CM

## 2013-07-07 DIAGNOSIS — O210 Mild hyperemesis gravidarum: Secondary | ICD-10-CM

## 2013-07-07 DIAGNOSIS — Z3493 Encounter for supervision of normal pregnancy, unspecified, third trimester: Secondary | ICD-10-CM

## 2013-07-07 LAB — POCT URINALYSIS DIP (DEVICE)
Bilirubin Urine: NEGATIVE
Ketones, ur: NEGATIVE mg/dL
pH: 7 (ref 5.0–8.0)

## 2013-07-07 MED ORDER — ONDANSETRON HCL 8 MG PO TABS: 8.0000 mg | ORAL_TABLET | Freq: Three times a day (TID) | ORAL | Status: DC | PRN

## 2013-07-07 NOTE — Patient Instructions (Signed)
Vaginal Delivery Your caregiver must first be sure you are in labor. Signs of labor include:  You may pass what is called "the mucus plug" before labor begins. This is a small amount of blood stained mucus.  Regular uterine contractions.  The time between contractions get closer together.  The discomfort and pain gradually gets more intense.  Pains are mostly located in the back.  Pains get worse when walking.  The cervix (the opening of the uterus) becomes thinner (begins to efface) and opens up (dilates). Once you are in labor and admitted into the hospital or care center, your caregiver will do the following:  A complete physical examination.  Check your vital signs (blood pressure, pulse, temperature and the fetal heart rate).  Do a vaginal examination (using a sterile glove and lubricant) to determine:  The position (presentation) of the baby (head [vertex] or buttock first).  The level (station) of the baby's head in the birth canal.  The effacement and dilatation of the cervix.  An electronic monitor is usually placed on your abdomen. The monitor follows the length and intensity of the contractions, as well as the baby's heart rate.  Usually, your caregiver will insert an IV in your arm with a bottle of sugar water. This is done as a precaution so that medications can be given to you quickly during labor or delivery. NORMAL LABOR AND DELIVERY IS DIVIDED UP INTO 3 STAGES: First Stage This is when regular contractions begin and the cervix begins to efface and dilate. This stage can last from 3 to 15 hours. The end of the first stage is when the cervix is 100% effaced and 10 centimeters dilated. Pain medications may be given by   Injection (morphine, demerol, etc.)  Regional anesthesia (spinal, caudal or epidural, anesthetics given in different locations of the spine). Paracervical pain medication may be given, which is an injection of and anesthetic on each side of the  cervix. A pregnant woman may request to have "Natural Childbirth" which is not to have any medications or anesthesia during her labor and delivery. Second Stage This is when the baby comes down through the birth canal (vagina) and is born. This can take 1 to 4 hours. As the baby's head comes down through the birth canal, you may feel like you are going to have a bowel movement. You will get the urge to bear down and push until the baby is delivered. As the baby's head is being delivered, the caregiver will decide if an episiotomy (a cut in the perineum and vagina area) is needed to prevent tearing of the tissue in this area. The episiotomy is sewn up after the delivery of the baby and placenta. Sometimes a mask with nitrous oxide is given for the mother to breath during the delivery of the baby to help if there is too much pain. The end of Stage 2 is when the baby is fully delivered. Then when the umbilical cord stops pulsating it is clamped and cut. Third Stage The third stage begins after the baby is completely delivered and ends after the placenta (afterbirth) is delivered. This usually takes 5 to 30 minutes. After the placenta is delivered, a medication is given either by intravenous or injection to help contract the uterus and prevent bleeding. The third stage is not painful and pain medication is usually not necessary. If an episiotomy was done, it is repaired at this time. After the delivery, the mother is watched and monitored closely for   1 to 2 hours to make sure there is no postpartum bleeding (hemorrhage). If there is a lot of bleeding, medication is given to contract the uterus and stop the bleeding. Document Released: 09/17/2008 Document Revised: 09/02/2012 Document Reviewed: 09/17/2008 [employer] Patient Information 2014 [employer].  

## 2013-07-07 NOTE — Addendum Note (Signed)
Addended by: Aviva Signs on: 07/07/2013 10:52 AM   Modules accepted: Orders

## 2013-07-07 NOTE — Progress Notes (Signed)
Pulse- 135  Patient requests zofran refill; patient reports vaginal pain/pressure

## 2013-07-07 NOTE — Progress Notes (Signed)
Doing well but needs refill on Zofran Reviewed signs of labor

## 2013-07-14 ENCOUNTER — Ambulatory Visit (INDEPENDENT_AMBULATORY_CARE_PROVIDER_SITE_OTHER): Payer: Medicaid Other | Admitting: Family

## 2013-07-14 VITALS — BP 138/88 | Temp 97.1°F | Wt 237.8 lb

## 2013-07-14 DIAGNOSIS — O358XX0 Maternal care for other (suspected) fetal abnormality and damage, not applicable or unspecified: Secondary | ICD-10-CM

## 2013-07-14 DIAGNOSIS — Z3402 Encounter for supervision of normal first pregnancy, second trimester: Secondary | ICD-10-CM

## 2013-07-14 DIAGNOSIS — O359XX1 Maternal care for (suspected) fetal abnormality and damage, unspecified, fetus 1: Secondary | ICD-10-CM

## 2013-07-14 LAB — POCT URINALYSIS DIP (DEVICE)
Glucose, UA: NEGATIVE mg/dL
Ketones, ur: NEGATIVE mg/dL
Specific Gravity, Urine: 1.02 (ref 1.005–1.030)

## 2013-07-14 NOTE — Progress Notes (Signed)
Pulse: 97

## 2013-07-14 NOTE — Progress Notes (Signed)
No questions of concerns; reviewed GBS results; membranes swept today.  Reviewed signs of labor.

## 2013-07-17 ENCOUNTER — Inpatient Hospital Stay (HOSPITAL_COMMUNITY)
Admission: AD | Admit: 2013-07-17 | Discharge: 2013-07-17 | Disposition: A | Discharge status: Home / Self Care | Payer: Medicaid Other | Source: Ambulatory Visit | Attending: Obstetrics & Gynecology | Admitting: Obstetrics & Gynecology

## 2013-07-17 ENCOUNTER — Encounter (HOSPITAL_COMMUNITY): Payer: Self-pay | Admitting: *Deleted

## 2013-07-17 DIAGNOSIS — O359XX1 Maternal care for (suspected) fetal abnormality and damage, unspecified, fetus 1: Secondary | ICD-10-CM

## 2013-07-17 DIAGNOSIS — Z3402 Encounter for supervision of normal first pregnancy, second trimester: Secondary | ICD-10-CM

## 2013-07-17 DIAGNOSIS — O479 False labor, unspecified: Secondary | ICD-10-CM | POA: Insufficient documentation

## 2013-07-17 NOTE — MAU Note (Signed)
Pt reports painful uc's since 0700.Pt denies lof or VB. + FM

## 2013-07-18 ENCOUNTER — Inpatient Hospital Stay (HOSPITAL_COMMUNITY)
Admission: AD | Admit: 2013-07-18 | Discharge: 2013-07-18 | Disposition: A | Discharge status: Home / Self Care | Payer: Medicaid Other | Source: Ambulatory Visit | Attending: Obstetrics and Gynecology | Admitting: Obstetrics and Gynecology

## 2013-07-18 ENCOUNTER — Encounter (HOSPITAL_COMMUNITY): Payer: Self-pay | Admitting: Obstetrics and Gynecology

## 2013-07-18 DIAGNOSIS — O99891 Other specified diseases and conditions complicating pregnancy: Secondary | ICD-10-CM | POA: Insufficient documentation

## 2013-07-18 DIAGNOSIS — O479 False labor, unspecified: Secondary | ICD-10-CM | POA: Insufficient documentation

## 2013-07-18 LAB — POCT FERN TEST: POCT Fern Test: NEGATIVE

## 2013-07-18 MED ORDER — PROMETHAZINE HCL 25 MG/ML IJ SOLN
12.5000 mg | Freq: Four times a day (QID) | INTRAMUSCULAR | Status: DC | PRN
  Administered 2013-07-18: 12.5 mg via INTRAMUSCULAR
  Filled 2013-07-18: qty 1

## 2013-07-18 MED ORDER — NALBUPHINE SYRINGE 5 MG/0.5 ML
10.0000 mg | INJECTION | Freq: Once | INTRAMUSCULAR | Status: AC
  Administered 2013-07-18: 10 mg via INTRAMUSCULAR
  Filled 2013-07-18: qty 1

## 2013-07-18 MED ORDER — MORPHINE SULFATE 10 MG/ML IJ SOLN
10.0000 mg | Freq: Once | INTRAMUSCULAR | Status: AC
  Administered 2013-07-18: 10 mg via INTRAMUSCULAR
  Filled 2013-07-18: qty 1

## 2013-07-18 NOTE — MAU Provider Note (Signed)
Attestation of Attending Supervision of Advanced Practitioner: Evaluation and management procedures were performed by the PA/NP/CNM/OB Fellow under my supervision/collaboration. Chart reviewed and agree with management and plan.  Pietra Zuluaga V 07/18/2013 9:35 PM    

## 2013-07-18 NOTE — MAU Provider Note (Signed)
Caitlin Sullivan is a 22 y.o. G2P0010 at [redacted]w[redacted]d here reporting leaking fluid. RN requests speculum exam for rule out SROM only.   O: Spec: negative for pool Fern: negative  FHR: 130 bpm, variability: moderate,  accelerations:  Present,  decelerations:  Absent  A/P: intact membranes RN to check cervix and r/o labor  Clevester Helzer 11:01 AM, 07/18/13

## 2013-07-18 NOTE — MAU Note (Signed)
Caitlin Sullivan is here for a labor evaluation. She is [redacted]w[redacted]d;G2P0. Was checked yesterday and was 3 and sent home with labor precautions.

## 2013-07-19 ENCOUNTER — Inpatient Hospital Stay (HOSPITAL_COMMUNITY): Payer: Medicaid Other | Admitting: Anesthesiology

## 2013-07-19 ENCOUNTER — Encounter (HOSPITAL_COMMUNITY): Payer: Self-pay | Admitting: Anesthesiology

## 2013-07-19 ENCOUNTER — Inpatient Hospital Stay (HOSPITAL_COMMUNITY)
Admission: AD | Admit: 2013-07-19 | Discharge: 2013-07-21 | DRG: 775 | Disposition: A | Discharge status: Home / Self Care | Payer: Medicaid Other | Source: Ambulatory Visit | Attending: Obstetrics and Gynecology | Admitting: Obstetrics and Gynecology

## 2013-07-19 ENCOUNTER — Encounter (HOSPITAL_COMMUNITY): Payer: Self-pay | Admitting: *Deleted

## 2013-07-19 DIAGNOSIS — O26899 Other specified pregnancy related conditions, unspecified trimester: Secondary | ICD-10-CM | POA: Diagnosis present

## 2013-07-19 DIAGNOSIS — K8020 Calculus of gallbladder without cholecystitis without obstruction: Secondary | ICD-10-CM | POA: Diagnosis present

## 2013-07-19 DIAGNOSIS — IMO0001 Reserved for inherently not codable concepts without codable children: Secondary | ICD-10-CM

## 2013-07-19 LAB — RPR: RPR Ser Ql: NONREACTIVE

## 2013-07-19 LAB — CBC
MCH: 27.9 pg (ref 26.0–34.0)
MCHC: 33.8 g/dL (ref 30.0–36.0)
RDW: 14 % (ref 11.5–15.5)

## 2013-07-19 LAB — TYPE AND SCREEN

## 2013-07-19 MED ORDER — ONDANSETRON HCL 4 MG/2ML IJ SOLN
4.0000 mg | Freq: Four times a day (QID) | INTRAMUSCULAR | Status: DC | PRN
  Administered 2013-07-19: 4 mg via INTRAVENOUS
  Filled 2013-07-19: qty 2

## 2013-07-19 MED ORDER — LACTATED RINGERS IV SOLN
500.0000 mL | INTRAVENOUS | Status: DC | PRN
  Administered 2013-07-19: 500 mL via INTRAVENOUS

## 2013-07-19 MED ORDER — OXYTOCIN 40 UNITS IN LACTATED RINGERS INFUSION - SIMPLE MED
1.0000 m[IU]/min | INTRAVENOUS | Status: DC
  Administered 2013-07-19: 4 m[IU]/min via INTRAVENOUS
  Administered 2013-07-19: 6 m[IU]/min via INTRAVENOUS

## 2013-07-19 MED ORDER — TERBUTALINE SULFATE 1 MG/ML IJ SOLN: 0.2500 mg | Freq: Once | INTRAMUSCULAR | Status: DC | PRN

## 2013-07-19 MED ORDER — OXYCODONE-ACETAMINOPHEN 5-325 MG PO TABS: 1.0000 | ORAL_TABLET | ORAL | Status: DC | PRN

## 2013-07-19 MED ORDER — OXYTOCIN BOLUS FROM INFUSION: 500.0000 mL | INTRAVENOUS | Status: DC

## 2013-07-19 MED ORDER — PHENYLEPHRINE 40 MCG/ML (10ML) SYRINGE FOR IV PUSH (FOR BLOOD PRESSURE SUPPORT)
80.0000 ug | PREFILLED_SYRINGE | INTRAVENOUS | Status: DC | PRN
  Filled 2013-07-19: qty 2

## 2013-07-19 MED ORDER — IBUPROFEN 600 MG PO TABS
600.0000 mg | ORAL_TABLET | Freq: Four times a day (QID) | ORAL | Status: DC
  Administered 2013-07-20 – 2013-07-21 (×7): 600 mg via ORAL
  Filled 2013-07-19 (×6): qty 1

## 2013-07-19 MED ORDER — EPHEDRINE 5 MG/ML INJ
10.0000 mg | INTRAVENOUS | Status: DC | PRN
  Filled 2013-07-19: qty 2

## 2013-07-19 MED ORDER — DIPHENHYDRAMINE HCL 50 MG/ML IJ SOLN: 12.5000 mg | INTRAMUSCULAR | Status: DC | PRN

## 2013-07-19 MED ORDER — PHENYLEPHRINE 40 MCG/ML (10ML) SYRINGE FOR IV PUSH (FOR BLOOD PRESSURE SUPPORT)
80.0000 ug | PREFILLED_SYRINGE | INTRAVENOUS | Status: DC | PRN
  Filled 2013-07-19: qty 2
  Filled 2013-07-19: qty 5

## 2013-07-19 MED ORDER — SENNOSIDES-DOCUSATE SODIUM 8.6-50 MG PO TABS
2.0000 | ORAL_TABLET | Freq: Every day | ORAL | Status: DC
  Administered 2013-07-19 – 2013-07-20 (×2): 2 via ORAL

## 2013-07-19 MED ORDER — ONDANSETRON HCL 4 MG PO TABS: 4.0000 mg | ORAL_TABLET | ORAL | Status: DC | PRN

## 2013-07-19 MED ORDER — PRENATAL MULTIVITAMIN CH
1.0000 | ORAL_TABLET | Freq: Every day | ORAL | Status: DC
  Administered 2013-07-20 – 2013-07-21 (×2): 1 via ORAL
  Filled 2013-07-19 (×2): qty 1

## 2013-07-19 MED ORDER — WITCH HAZEL-GLYCERIN EX PADS: 1.0000 "application " | MEDICATED_PAD | CUTANEOUS | Status: DC | PRN

## 2013-07-19 MED ORDER — LIDOCAINE HCL (PF) 1 % IJ SOLN
INTRAMUSCULAR | Status: DC | PRN
  Administered 2013-07-19 (×2): 4 mL

## 2013-07-19 MED ORDER — LACTATED RINGERS IV SOLN
INTRAVENOUS | Status: DC
  Administered 2013-07-19: 07:00:00 via INTRAVENOUS
  Administered 2013-07-19: 125 mL/h via INTRAVENOUS

## 2013-07-19 MED ORDER — FENTANYL 2.5 MCG/ML BUPIVACAINE 1/10 % EPIDURAL INFUSION (WH - ANES)
INTRAMUSCULAR | Status: DC | PRN
  Administered 2013-07-19: 14 mL/h via EPIDURAL

## 2013-07-19 MED ORDER — LIDOCAINE HCL (PF) 1 % IJ SOLN
30.0000 mL | INTRAMUSCULAR | Status: AC | PRN
  Administered 2013-07-19: 30 mL via SUBCUTANEOUS
  Filled 2013-07-19 (×2): qty 30

## 2013-07-19 MED ORDER — BENZOCAINE-MENTHOL 20-0.5 % EX AERO
1.0000 "application " | INHALATION_SPRAY | CUTANEOUS | Status: DC | PRN
  Administered 2013-07-20: 1 via TOPICAL
  Filled 2013-07-19 (×2): qty 56

## 2013-07-19 MED ORDER — DIBUCAINE 1 % RE OINT: 1.0000 "application " | TOPICAL_OINTMENT | RECTAL | Status: DC | PRN

## 2013-07-19 MED ORDER — EPHEDRINE 5 MG/ML INJ
10.0000 mg | INTRAVENOUS | Status: DC | PRN
  Filled 2013-07-19: qty 2
  Filled 2013-07-19: qty 4

## 2013-07-19 MED ORDER — TETANUS-DIPHTH-ACELL PERTUSSIS 5-2.5-18.5 LF-MCG/0.5 IM SUSP
0.5000 mL | Freq: Once | INTRAMUSCULAR | Status: AC
  Administered 2013-07-20: 0.5 mL via INTRAMUSCULAR

## 2013-07-19 MED ORDER — FENTANYL 2.5 MCG/ML BUPIVACAINE 1/10 % EPIDURAL INFUSION (WH - ANES)
14.0000 mL/h | INTRAMUSCULAR | Status: DC | PRN
  Administered 2013-07-19: 14 mL/h via EPIDURAL
  Filled 2013-07-19 (×2): qty 125

## 2013-07-19 MED ORDER — ALBUTEROL SULFATE HFA 108 (90 BASE) MCG/ACT IN AERS: 1.0000 | INHALATION_SPRAY | RESPIRATORY_TRACT | Status: DC | PRN

## 2013-07-19 MED ORDER — SIMETHICONE 80 MG PO CHEW: 80.0000 mg | CHEWABLE_TABLET | ORAL | Status: DC | PRN

## 2013-07-19 MED ORDER — LACTATED RINGERS IV SOLN: 500.0000 mL | Freq: Once | INTRAVENOUS | Status: DC

## 2013-07-19 MED ORDER — ONDANSETRON HCL 4 MG/2ML IJ SOLN: 4.0000 mg | INTRAMUSCULAR | Status: DC | PRN

## 2013-07-19 MED ORDER — ACETAMINOPHEN 325 MG PO TABS: 650.0000 mg | ORAL_TABLET | ORAL | Status: DC | PRN

## 2013-07-19 MED ORDER — CITRIC ACID-SODIUM CITRATE 334-500 MG/5ML PO SOLN: 30.0000 mL | ORAL | Status: DC | PRN

## 2013-07-19 MED ORDER — OXYTOCIN 40 UNITS IN LACTATED RINGERS INFUSION - SIMPLE MED
62.5000 mL/h | INTRAVENOUS | Status: DC
  Filled 2013-07-19: qty 1000

## 2013-07-19 MED ORDER — IBUPROFEN 600 MG PO TABS
600.0000 mg | ORAL_TABLET | Freq: Four times a day (QID) | ORAL | Status: DC | PRN
  Administered 2013-07-19: 600 mg via ORAL
  Filled 2013-07-19: qty 1

## 2013-07-19 MED ORDER — ZOLPIDEM TARTRATE 5 MG PO TABS: 5.0000 mg | ORAL_TABLET | Freq: Every evening | ORAL | Status: DC | PRN

## 2013-07-19 MED ORDER — DIPHENHYDRAMINE HCL 25 MG PO CAPS: 25.0000 mg | ORAL_CAPSULE | Freq: Four times a day (QID) | ORAL | Status: DC | PRN

## 2013-07-19 MED ORDER — OXYCODONE-ACETAMINOPHEN 5-325 MG PO TABS
1.0000 | ORAL_TABLET | ORAL | Status: DC | PRN
  Administered 2013-07-19 – 2013-07-20 (×3): 1 via ORAL
  Filled 2013-07-19 (×3): qty 1

## 2013-07-19 MED ORDER — NALBUPHINE SYRINGE 5 MG/0.5 ML
10.0000 mg | INJECTION | INTRAMUSCULAR | Status: DC | PRN
  Filled 2013-07-19: qty 1

## 2013-07-19 MED ORDER — LANOLIN HYDROUS EX OINT: TOPICAL_OINTMENT | CUTANEOUS | Status: DC | PRN

## 2013-07-19 NOTE — H&P (Signed)
Attestation of Attending Supervision of Advanced Practitioner: Evaluation and management procedures were performed by the PA/NP/CNM/OB Fellow under my supervision/collaboration. Chart reviewed and agree with management and plan.  Tilda Burrow 07/19/2013 7:44 PM

## 2013-07-19 NOTE — Progress Notes (Signed)
Patient ID: Caitlin Sullivan, female   DOB: Mar 28, 1991, 22 y.o.   MRN: [id number] LABOR PROGRESS NOTE  Caitlin Sullivan is a 22 y.o. G2P0010 at [redacted]w[redacted]d  admitted for labor  Subjective:  Pt complaining of pressure.  Some mild pain.    Objective: BP 154/98  Pulse 109  Temp(Src) 98.9 F (37.2 C) (Oral)  Resp 20  Ht 5\' 6"  (1.676 m)  Wt 107.593 kg (237 lb 3.2 oz)  BMI 38.3 kg/m2  SpO2 99%  LMP 09/29/2012    FHT:  FHR: 130 bpm, variability: moderate,  accelerations:  Present,  decelerations:  Absent UC:   regular, every 2 minutes SVE:   Dilation: Lip/rim Effacement (%): 100 Station: 0 Exam by:: dr. Reola Calkins Pitocin @ 9 mu/min  Labs: Lab Results  Component Value Date   WBC 13.8* 07/19/2013   HGB 12.3 07/19/2013   HCT 36.4 07/19/2013   MCV 82.5 07/19/2013   PLT 314 07/19/2013    Assessment / Plan: Augmentation of labor, progressing well  Labor: Progressing normally Fetal Wellbeing:  Category I Pain Control:  Epidural Anticipated MOD:  NSVD  Rhegan Trunnell L, MD 07/19/2013, 3:12 PM

## 2013-07-19 NOTE — MAU Note (Signed)
Pt G2 P0 at 39.2wks having contractions and having bloody show.

## 2013-07-19 NOTE — Anesthesia Procedure Notes (Signed)
Epidural Patient location during procedure: OB Start time: 07/19/2013 7:21 AM  Staffing Anesthesiologist: Averie Hornbaker A. Performed by: anesthesiologist   Preanesthetic Checklist Completed: patient identified, site marked, surgical consent, pre-op evaluation, timeout performed, IV checked, risks and benefits discussed and monitors and equipment checked  Epidural Patient position: sitting Prep: site prepped and draped and DuraPrep Patient monitoring: continuous pulse ox and blood pressure Approach: midline Injection technique: LOR air  Needle:  Needle type: Tuohy  Needle gauge: 17 G Needle length: 9 cm and 9 Needle insertion depth: 5 cm cm Catheter type: closed end flexible Catheter size: 19 Gauge Catheter at skin depth: 10 cm Test dose: negative and Other  Assessment Events: blood not aspirated, injection not painful, no injection resistance, negative IV test and no paresthesia  Additional Notes Patient identified. Risks and benefits discussed including failed block, incomplete  Pain control, post dural puncture headache, nerve damage, paralysis, blood pressure Changes, nausea, vomiting, reactions to medications-both toxic and allergic and post Partum back pain. All questions were answered. Patient expressed understanding and wished to proceed. Sterile technique was used throughout procedure. Epidural site was Dressed with sterile barrier dressing. No paresthesias, signs of intravascular injection Or signs of intrathecal spread were encountered.  Patient was more comfortable after the epidural was dosed. Please see RN's note for documentation of vital signs and FHR which are stable.

## 2013-07-19 NOTE — Progress Notes (Signed)
Patient ID: Caitlin Sullivan, female   DOB: 1991-11-09, 22 y.o.   MRN: [id number] LABOR PROGRESS NOTE  Caitlin Sullivan is a 22 y.o. G2P0010 at [redacted]w[redacted]d  admitted for active labor  Subjective: doing well s/p epidural. Not feeling ctx. +FM.    Objective: BP 152/98  Pulse 92  Temp(Src) 98.2 F (36.8 C) (Oral)  Resp 20  Ht 5\' 6"  (1.676 m)  Wt 107.593 kg (237 lb 3.2 oz)  BMI 38.3 kg/m2  SpO2 99%  LMP 09/29/2012    FHT:  FHR: 130s bpm, variability: moderate,  accelerations:  Present,  decelerations:  Absent UC:   regular, every 4-6 minutes SVE:   Dilation: 5.5 Effacement (%): 90 Station: -1 Exam by:: dr. Reola Calkins  Labs: Lab Results  Component Value Date   WBC 13.8* 07/19/2013   HGB 12.3 07/19/2013   HCT 36.4 07/19/2013   MCV 82.5 07/19/2013   PLT 314 07/19/2013    Assessment / Plan: Spontaneous labor, progressing normally  Labor: Progressing normally and AROM performed without difficulty - clear fluid  Fetal Wellbeing:  Category I Pain Control:  Epidural Anticipated MOD:  NSVD  Caitlin Sullivan L, MD 07/19/2013, 9:34 AM

## 2013-07-19 NOTE — H&P (Signed)
Caitlin Sullivan is a 22 y.o. female presenting for contractions starting yesterday.  Seen in MAU yesterday and day before 1-->3-->4-->4.5 cm today. No bleeding or loss of fluid.  Baby moving well.  Receives care in [facility] since second trimester due to bleeding and cholelithiasis. States she had a blood clot on her placenta and was bleeding at 4 months. Noted to have had a subchorionic hemorrhage. 1 hour GTT 151, 3 hour [id number]. Normal genetic screening and ultrasound at 18 weeks. Early renal pylectasis, resolved at [redacted]w[redacted]d when U/S showed normal kidneys and growth in 84%.  Maternal Medical History:  Reason for admission: Contractions.   Contractions: Onset was yesterday.   Frequency: regular.   Perceived severity is strong.    Fetal activity: Perceived fetal activity is normal.   Last perceived fetal movement was within the past hour.    Prenatal complications: Bleeding and cholelithiasis.   Prenatal Complications - Diabetes: none.    OB History   Grav Para Term Preterm Abortions TAB SAB Ect Mult Living   2    1 1     0     Past Medical History  Diagnosis Date  . Umbilical hernia   . Hyperemesis arising during pregnancy   . Asthma   . Gall stones    Past Surgical History  Procedure Laterality Date  . Pilonidal cyst / sinus excision     Family History: family history includes Asthma in her brother; Cancer in her mother; Diabetes in her father; Hyperlipidemia in her father; Hypertension in her father and mother; and Stroke in her father. Social History:  reports that she quit smoking about 8 months ago. Her smoking use included Cigarettes. She smoked 0.25 packs per day. She has never used smokeless tobacco. She reports that she does not drink alcohol or use illicit drugs.   Prenatal Transfer Tool  Maternal Diabetes: No Genetic Screening: Normal Maternal Ultrasounds/Referrals: Normal Fetal Ultrasounds or other Referrals:  None Maternal Substance Abuse:  No Significant  Maternal Medications:  None Significant Maternal Lab Results:  Lab values include: Group B Strep negative Other Comments:  renal pylectasis on early ultrasound, resolved by [redacted]w[redacted]d  ROS   See HPI   Dilation: 4.5 Effacement (%): 90 Station: -2 Exam by:: Rudi Coco RN Blood pressure 133/85, pulse 104, temperature 98.9 F (37.2 C), temperature source Oral, resp. rate 20, height 5\' 6"  (1.676 m), weight 107.593 kg (237 lb 3.2 oz), last menstrual period 09/29/2012.  Maternal Exam:  Uterine Assessment: Contraction frequency is regular.      Fetal Exam Fetal Monitor Review: Mode: ultrasound.   Baseline rate: 135.  Variability: moderate (6-25 bpm).   Pattern: accelerations present and no decelerations.    Fetal State Assessment: Category I - tracings are normal.     Physical Exam  Prenatal labs: ABO, Rh: --/--/O POS, O POS (05/23 2320) Antibody: NEG (05/23 2320) Rubella: 2.31 (01/16 1111) RPR: NON REAC (05/15 0905)  HBsAg: NEGATIVE (01/16 1111)  HIV: NON REACTIVE (01/16 1111)  GBS:   negative  Assessment/Plan: 22 y.o. G2P0010 at [redacted]w[redacted]d with SOL - Admit to L&D - GBS negative - Epidural when desired - Anticipate SVD   Napoleon Form 07/19/2013, 6:10 AM

## 2013-07-19 NOTE — Anesthesia Preprocedure Evaluation (Signed)
Anesthesia Evaluation  Patient identified by MRN, date of birth, ID band Patient awake    Reviewed: Allergy & Precautions, H&P , Patient's Chart, lab work & pertinent test results  Airway Mallampati: III TM Distance: >3 FB Neck ROM: Full    Dental no notable dental hx. (+) Teeth Intact   Pulmonary neg pulmonary ROS, asthma , former smoker,  breath sounds clear to auscultation  Pulmonary exam normal       Cardiovascular negative cardio ROS  Rhythm:Regular Rate:Normal     Neuro/Psych negative neurological ROS  negative psych ROS   GI/Hepatic Neg liver ROS, GERD-  Medicated and Controlled,Hyperemesis   Endo/Other  Obesity  Renal/GU negative Renal ROS  negative genitourinary   Musculoskeletal negative musculoskeletal ROS (+)   Abdominal (+) + obese,   Peds  Hematology negative hematology ROS (+)   Anesthesia Other Findings   Reproductive/Obstetrics (+) Pregnancy                           Anesthesia Physical Anesthesia Plan  ASA: II  Anesthesia Plan: Epidural   Post-op Pain Management:    Induction:   Airway Management Planned: Natural Airway  Additional Equipment:   Intra-op Plan:   Post-operative Plan:   Informed Consent: I have reviewed the patients History and Physical, chart, labs and discussed the procedure including the risks, benefits and alternatives for the proposed anesthesia with the patient or authorized representative who has indicated his/her understanding and acceptance.   Dental advisory given  Plan Discussed with: Anesthesiologist  Anesthesia Plan Comments:         Anesthesia Quick Evaluation

## 2013-07-19 NOTE — Progress Notes (Signed)
Theta Leaf is a 22 y.o. G2P0010 at [redacted]w[redacted]d admitted for onset of labor  Subjective: Patient comfortable currently with epidural. Doesn't feel any contractions.  Objective: BP 118/65  Pulse 85  Temp(Src) 98.4 F (36.9 C) (Oral)  Resp 18  Ht 5\' 6"  (1.676 m)  Wt 107.593 kg (237 lb 3.2 oz)  BMI 38.3 kg/m2  SpO2 99%  LMP 09/29/2012      FHT:  FHR: 130 bpm, variability: moderate,  accelerations:  Present,  decelerations:  Absent UC:   None, not tracing well SVE:   Dilation: 6 Effacement (%): 100 Station: 0 Exam by:: felkel,rn  Labs: Lab Results  Component Value Date   WBC 13.8* 07/19/2013   HGB 12.3 07/19/2013   HCT 36.4 07/19/2013   MCV 82.5 07/19/2013   PLT 314 07/19/2013    Assessment / Plan: Spontaneous labor Pitocin started at 12:45pm  Labor: Progressing on Pitocin, will continue to increase Fetal Wellbeing:  Category I Pain Control:  Epidural I/D:  n/a Anticipated MOD:  NSVD  Tawni Carnes 07/19/2013, 1:45 PM

## 2013-07-20 ENCOUNTER — Encounter: Payer: Self-pay | Admitting: Family

## 2013-07-20 NOTE — Plan of Care (Signed)
Problem: Phase II Progression Outcomes Goal: Rh isoimmunization per orders Outcome: Not Applicable Date Met:  07/20/13 PATENT IMMUNE

## 2013-07-20 NOTE — Progress Notes (Signed)
Post Partum Day 1  Subjective: no complaints, up ad lib, voiding, tolerating PO and + flatus  Objective: Blood pressure 91/66, pulse 92, temperature 97.2 F (36.2 C), temperature source Oral, resp. rate 17, height 5\' 6"  (1.676 m), weight 107.593 kg (237 lb 3.2 oz), last menstrual period 09/29/2012, SpO2 98.00%, unknown if currently breastfeeding.  Physical Exam:  General: alert, cooperative and no distress Lochia: appropriate Uterine Fundus: firm Incision: na DVT Evaluation: No evidence of DVT seen on physical exam.   Recent Labs  07/19/13 0630  HGB 12.3  HCT 36.4    Assessment/Plan: Plan for discharge tomorrow, Breastfeeding, Lactation consult and Contraception undecided   LOS: 1 day   Sukaina Toothaker L 07/20/2013, 7:49 AM

## 2013-07-20 NOTE — Progress Notes (Signed)
UR chart review completed.  

## 2013-07-21 ENCOUNTER — Encounter: Payer: Medicaid Other | Admitting: Obstetrics and Gynecology

## 2013-07-21 MED ORDER — IBUPROFEN 600 MG PO TABS: 600.0000 mg | ORAL_TABLET | Freq: Four times a day (QID) | ORAL | Status: DC

## 2013-07-21 NOTE — Anesthesia Postprocedure Evaluation (Signed)
  Anesthesia Post-op Note Anesthesia Post Note  Patient: Caitlin Sullivan  Procedure(s) Performed: * No procedures listed *  Anesthesia type: Epidural  Patient location: Mother/Baby  Post pain: Pain level controlled  Post assessment: Post-op Vital signs reviewed  Last Vitals:  Filed Vitals:   07/21/13 0535  BP: 121/82  Pulse: 109  Temp: 36.7 C  Resp: 18    Post vital signs: Reviewed  Level of consciousness:alert  Complications: No apparent anesthesia complications  

## 2013-07-21 NOTE — Discharge Summary (Signed)
Obstetric Discharge Summary Reason for Admission: onset of labor Prenatal Procedures: none Intrapartum Procedures: spontaneous vaginal delivery Postpartum Procedures: none Complications-Operative and Postpartum: 2nd degree perineal laceration  Attempted breastfeeding, switched to bottle. Plans on nexplanon for birth control.  Hemoglobin  Date Value Range Status  07/19/2013 12.3  12.0 - 15.0 g/dL Final     HCT  Date Value Range Status  07/19/2013 36.4  36.0 - 46.0 % Final    Physical Exam:  General: alert, cooperative and no distress Lochia: appropriate Uterine Fundus: firm DVT Evaluation: No evidence of DVT seen on physical exam. No cords or calf tenderness. Calf/Ankle edema is present, 1+ pitting.  Discharge Diagnoses: Term Pregnancy-delivered  Discharge Information: Date: 07/21/2013 Activity: pelvic rest Diet: routine Medications: PNV and Ibuprofen Condition: stable Instructions: refer to practice specific booklet Discharge to: home Follow-up Information   Follow up with [facility]. Schedule an appointment as soon as possible for a visit in 4 weeks. (Call and make an appointment for 4-6 weeks from now)    Contact information:   [address] [place] [address] [phone]      Newborn Data: Live born female  Birth Weight: 9 lb 11.4 oz (4406 g) APGAR: 9, 10  Home with mother.  Caitlin Sullivan 07/21/2013, 9:12 AM  I have seen and examined this patient and I agree with the above. Cam Hai 9:25 AM 07/21/2013

## 2013-07-21 NOTE — Anesthesia Postprocedure Evaluation (Signed)
  Anesthesia Post-op Note Anesthesia Post Note  Patient: Caitlin Sullivan  Procedure(s) Performed: * No procedures listed *  Anesthesia type: Epidural  Patient location: Mother/Baby  Post pain: Pain level controlled  Post assessment: Post-op Vital signs reviewed  Last Vitals:  Filed Vitals:   07/21/13 0535  BP: 121/82  Pulse: 109  Temp: 36.7 C  Resp: 18    Post vital signs: Reviewed  Level of consciousness:alert  Complications: No apparent anesthesia complications

## 2013-07-26 NOTE — Discharge Summary (Signed)
Attestation of Attending Supervision of Advanced Practitioner (CNM/NP): Evaluation and management procedures were performed by the Advanced Practitioner under my supervision and collaboration.  I have reviewed the Advanced Practitioner's note and chart, and I agree with the management and plan.  Valli Randol 07/26/2013 1:27 PM

## 2013-08-26 ENCOUNTER — Ambulatory Visit: Payer: Medicaid Other | Admitting: Family

## 2013-09-17 ENCOUNTER — Ambulatory Visit: Payer: Medicaid Other | Admitting: Family

## 2013-09-23 IMAGING — US US OB DETAIL+14 WK
2 series · 12 of 28 positions shown · non-contrast
Comparison: none

[Series 1: us ob detail +14 wk · 81 acquisitions, 11 frames shown (1 of 2)]
[im 4/81]
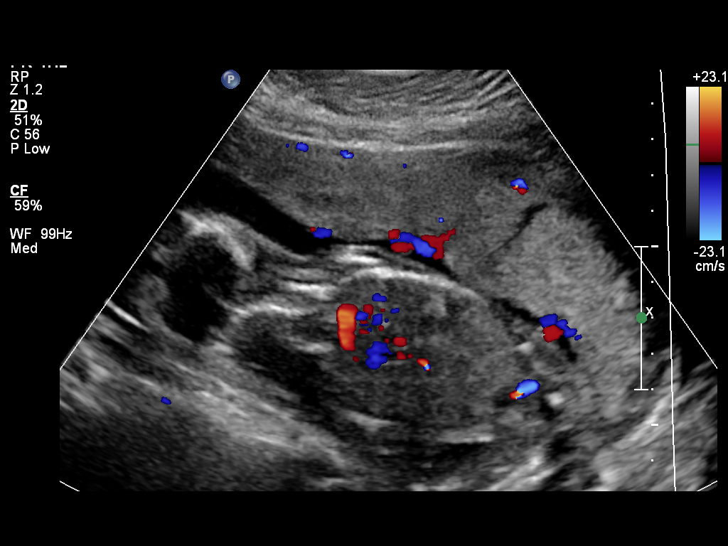
[im 11/81]
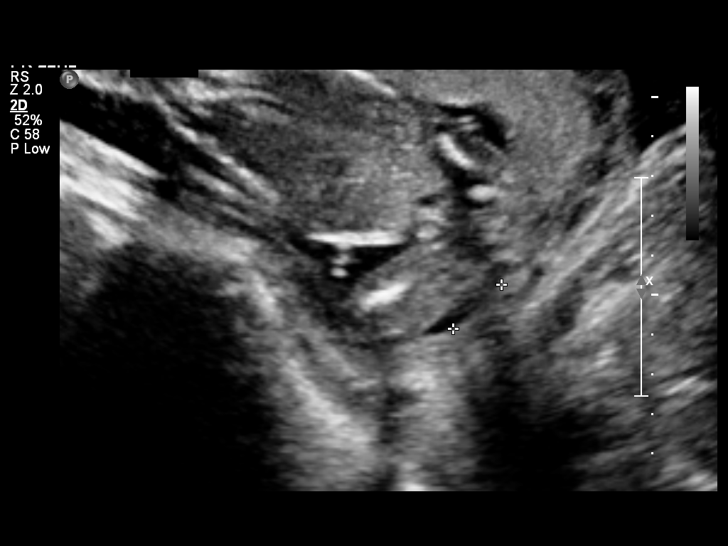
[im 17/81]
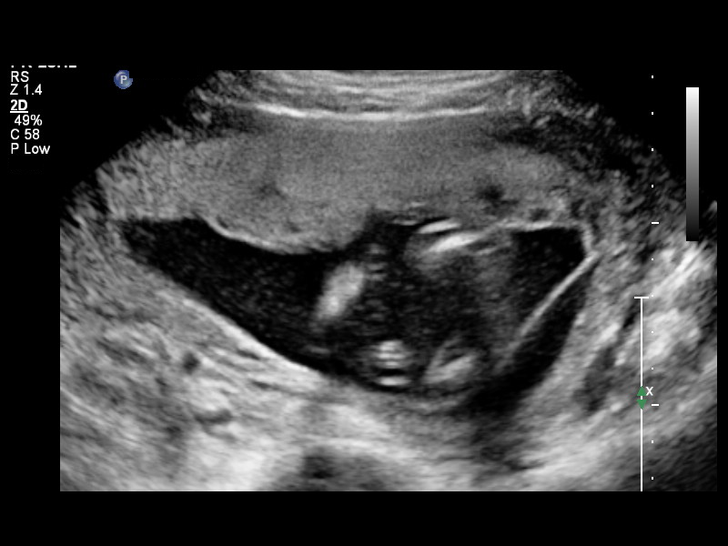
[im 27/81]
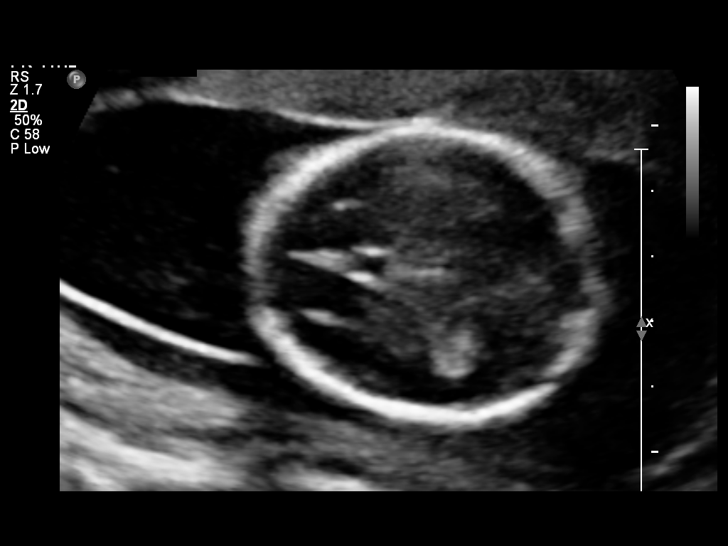
[im 34/81]
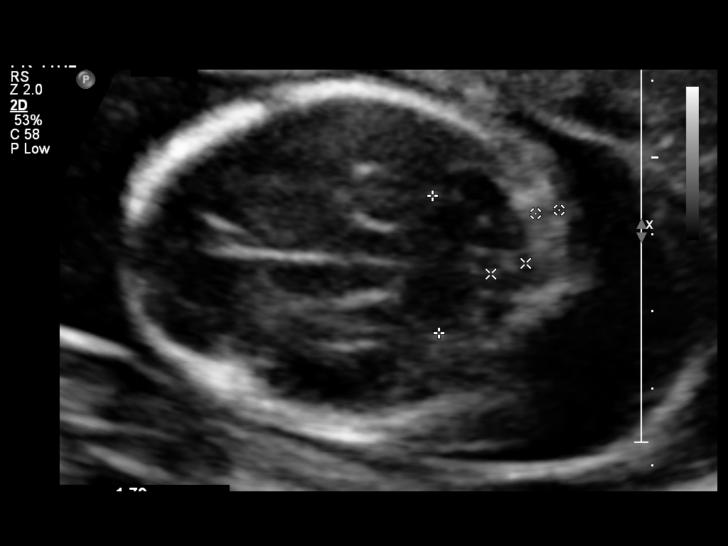
[im 41/81]
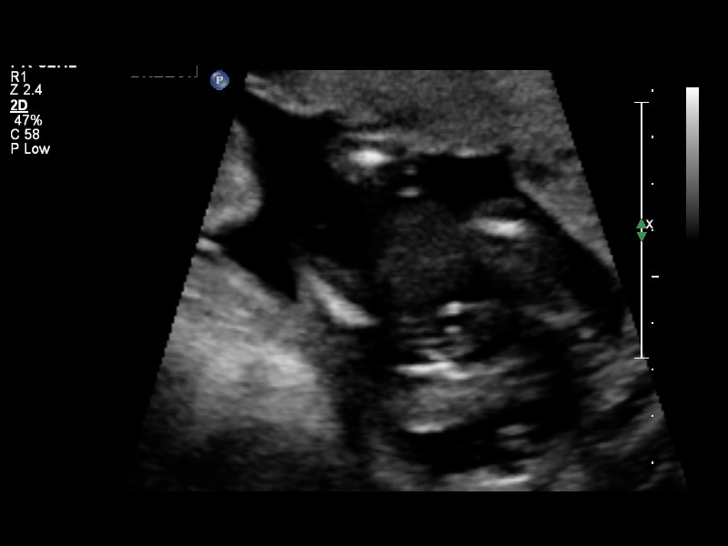
[im 51/81]
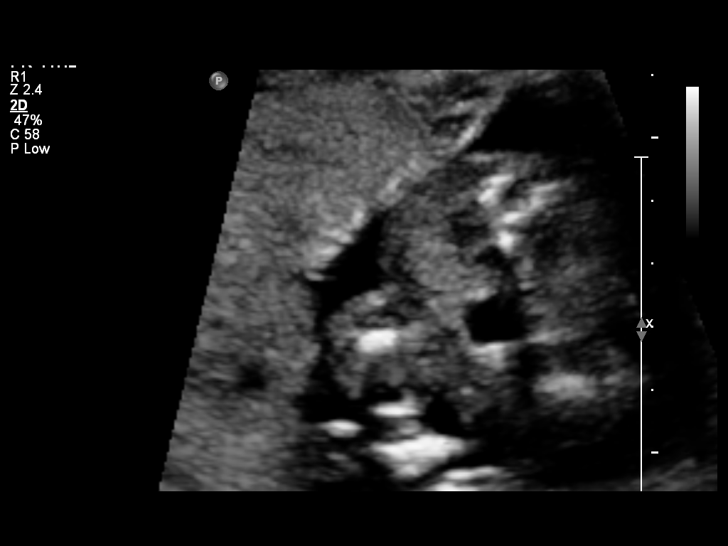
[im 57/81]
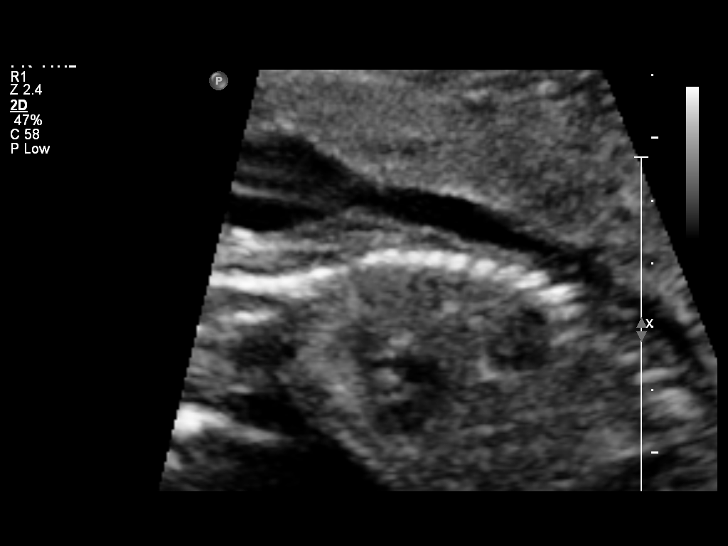
[im 64/81]
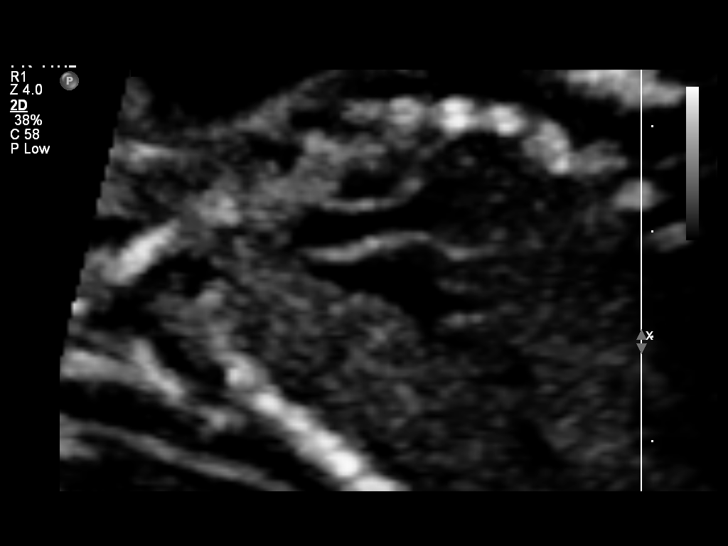
[im 74/81]
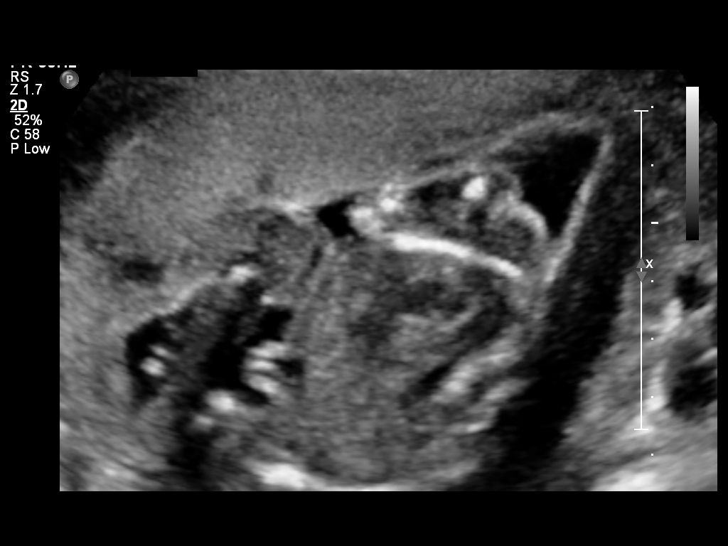
[im 81/81]
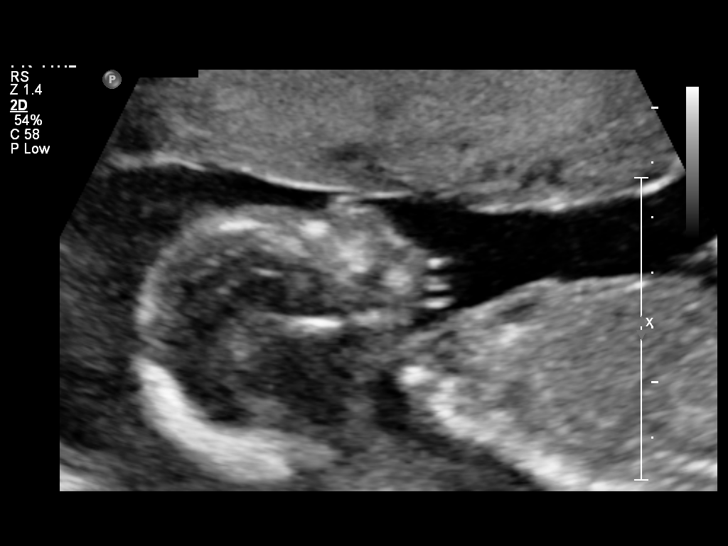

[Series 1: us ob detail +14 wk · 1 of 10 slices shown (2 of 2)]
[im 5/10]
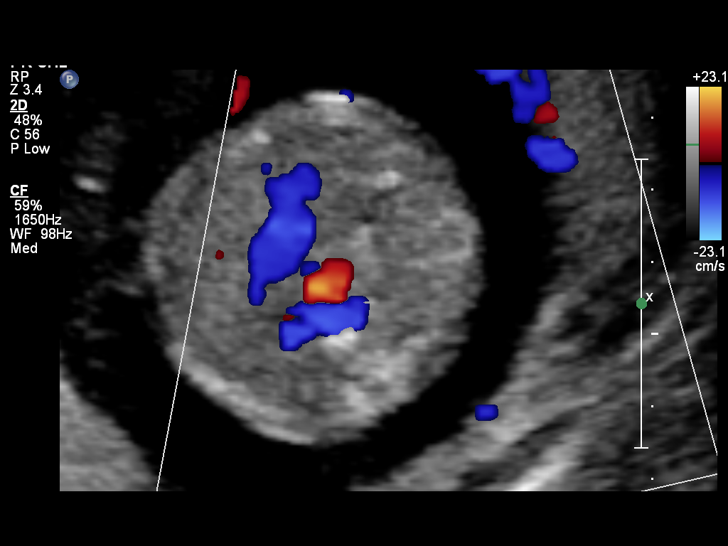

[12 of 28 positions shown; findings below may reference images not displayed]

OBSTETRICS REPORT
                      (Signed Final 02/22/2013 [DATE])

                                                         CNM
Service(s) Provided

 US OB DETAIL + 14 WK                                  76811.0
Indications

 Detailed fetal anatomic survey
 Vaginal bleeding, unknown etiology x 4 weeks.
Fetal Evaluation

 Num Of Fetuses:    1
 Cardiac Activity:  Observed
 Presentation:      Breech
 Placenta:          Anterior, low-lying,
                    cm from int os
 P. Cord            Visualized, central
 Insertion:

 Amniotic Fluid
 AFI FV:      Subjectively within normal limits
Biometry

 BPD:     43.3  mm    G. Age:   19w 1d                CI:        71.77   70 - 86
                                                      FL/HC:      17.9   15.8 -
                                                                         18
 HC:     162.7  mm    G. Age:   19w 0d       78  %    HC/AC:      1.13   1.07 -

 AC:     143.7  mm    G. Age:   19w 5d       87  %    FL/BPD:
 FL:      29.1  mm    G. Age:   19w 0d       68  %    FL/AC:      20.3   20 - 24
 HUM:     28.4  mm    G. Age:   19w 1d       78  %
 CER:     17.9  mm    G. Age:   17w 6d       37  %
 NFT:     3.09  mm

 Est. FW:     286  gm    0 lb 10 oz      64  %
Gestational Age

 LMP:           20w 6d       Date:   09/29/12                 EDD:   07/06/13
 U/S Today:     19w 1d                                        EDD:   07/18/13
 Best:          18w 2d    Det. By:   U/S C R L (12/14/12)     EDD:   07/24/13
2nd Trimester Genetic Sonogram - Trisomy 21 Screening

 Age:                                             22          Risk=1:   885
 Structural anomalies (inc. cardiac):             No
 Echogenic bowel:                                 No
 Hypoplastic / absent midphalanx 5th Digit:       No
 Wide space 3st-9nd toes:                         N/A
 Pyelectasis:                                     No
 2-vessel umbilical cord:                         No
 Echogenic cardiac foci:                          No
Anatomy

 Cranium:          Appears normal         Aortic Arch:      Appears normal
 Fetal Cavum:      Appears normal         Ductal Arch:      Appears normal
 Ventricles:       Appears normal         Diaphragm:        Appears normal
 Choroid Plexus:   Appears normal         Stomach:          Appears normal, left
                                                            sided
 Cerebellum:       Appears normal         Abdomen:          Appears normal
 Posterior Fossa:  Appears normal         Abdominal Wall:   Appears nml (cord
                                                            insert, abd wall)
 Nuchal Fold:      Appears normal         Cord Vessels:     Appears normal (3
                                                            vessel cord)
 Face:             Appears normal         Kidneys:          Appear normal
                   (orbits and profile)
 Lips:             Appears normal         Bladder:          Appears normal
 Heart:            Appears normal         Spine:            Appears normal
                   (4CH, axis, and
                   situs)
 RVOT:             Appears normal         Lower             Appears normal
                                          Extremities:
 LVOT:             Appears normal         Upper             Appears normal
                                          Extremities:

 Other:  Fetus appears to be a male. Heels visualized. Nasal bone visualized.
         Technically difficult due to fetal position.
Targeted Anatomy

 Fetal Central Nervous System
 Cisterna Magna:
Cervix Uterus Adnexa

 Cervical Length:   3.98      cm

 Cervix:       Normal appearance by transabdominal scan.
 Uterus:       Subchorionic collection seen along the majority of
               contour of uterus, but does not extend posterior to
               placenta;

 Left Ovary:   Within normal limits.
 Right Ovary:  Within normal limits.
 Adnexa:     No abnormality visualized.
Impression

 Single living IUP with assigned GA of 18w 2d. EGA by
 ultrasound is concordant with assigned GA.
 No fetal anatomic abnormality is identified.
 The previously described echogenic, heterogeneous
 collection (subchorionic hemorrhage vs placental abruption)
 described on the ultrasound of 01/30/2013 is now predominately
 sonolucent, consistent with expected evolution of blood
 products over time.
 Normal amniotic fluid volume and cervical length.
 questions or concerns.

## 2013-12-27 IMAGING — US US OB FOLLOW-UP
1 series · 12 of 28 positions shown · non-contrast
Comparison: none

[Series 1: us ob follow-up · 0.32mm/px · 12 of 34 slices shown]
[im 2/34]
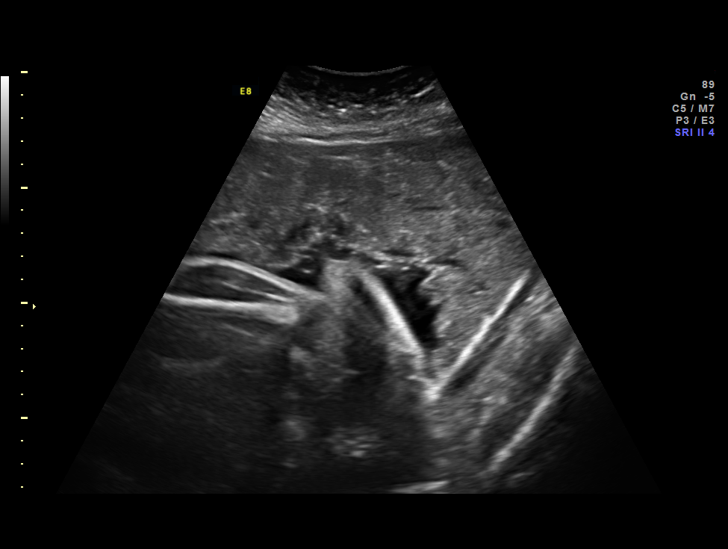
[im 4/34]
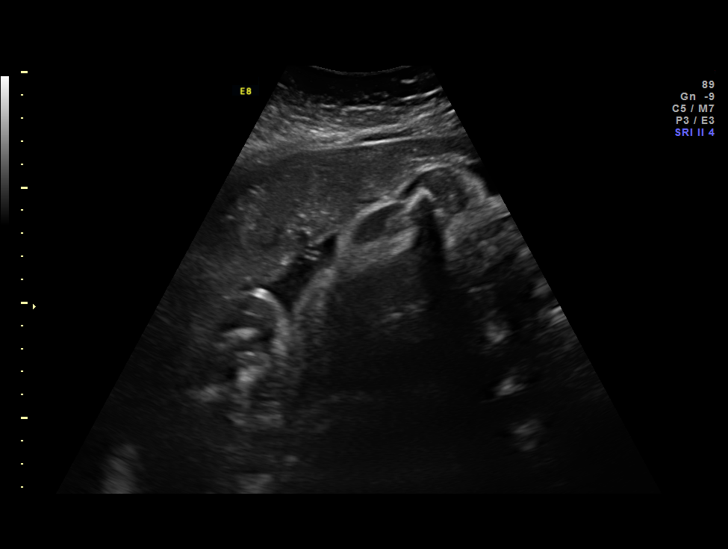
[im 7/34]
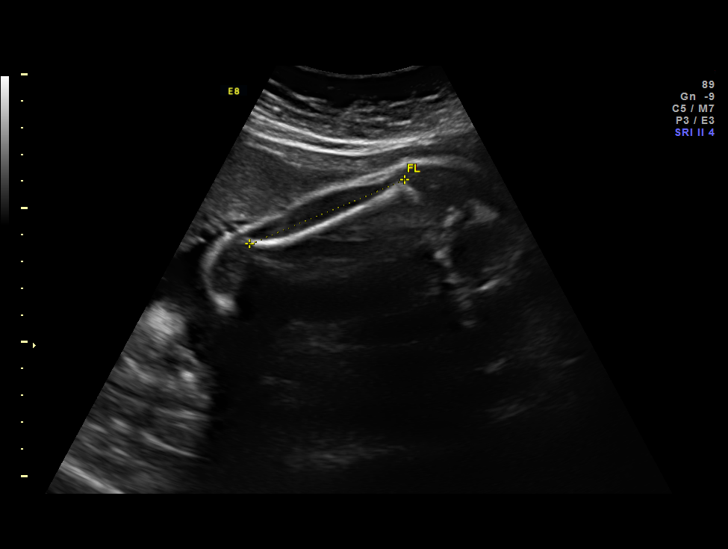
[im 10/34]
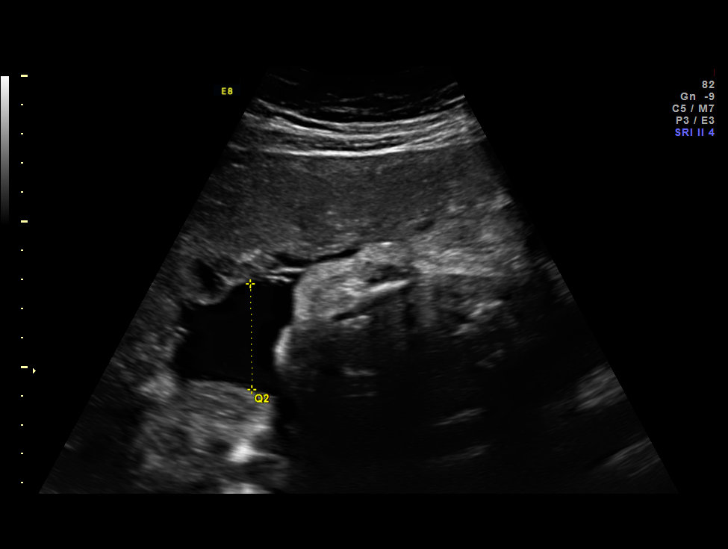
[im 13/34]
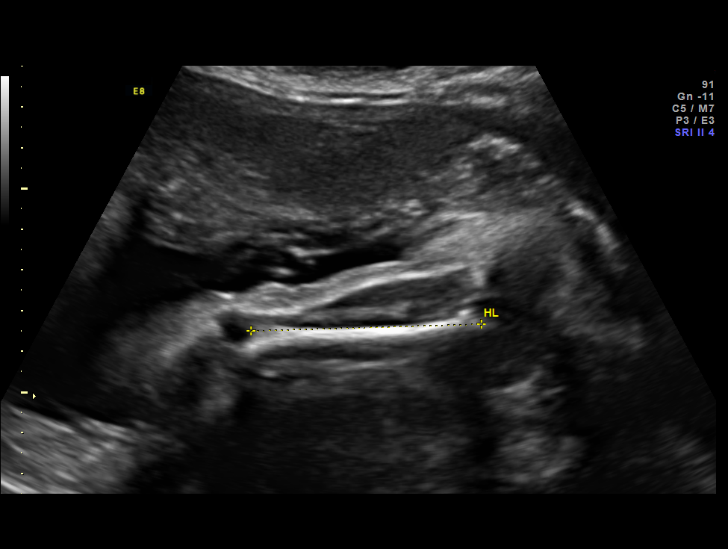
[im 15/34]
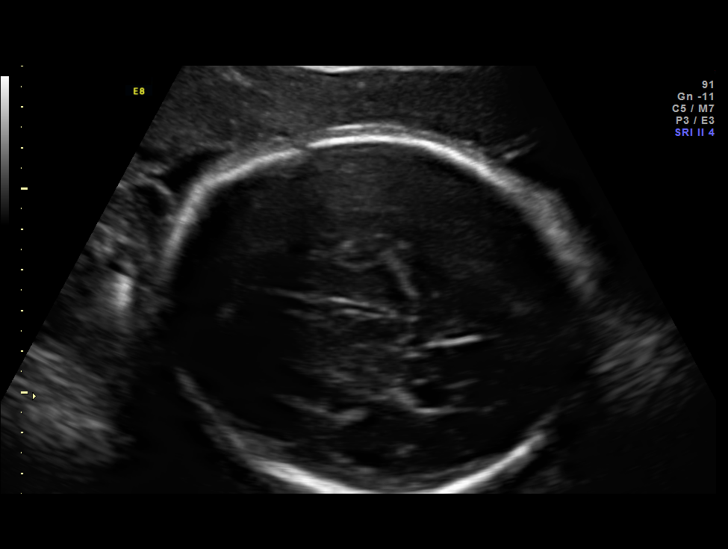
[im 19/34]
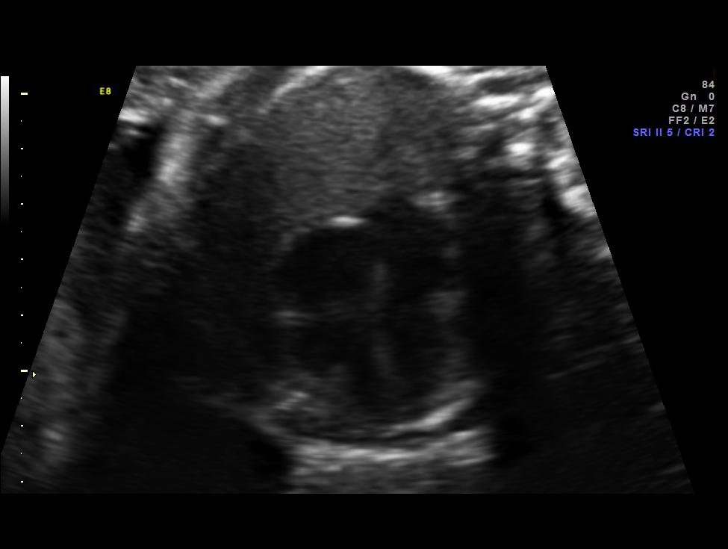
[im 21/34]
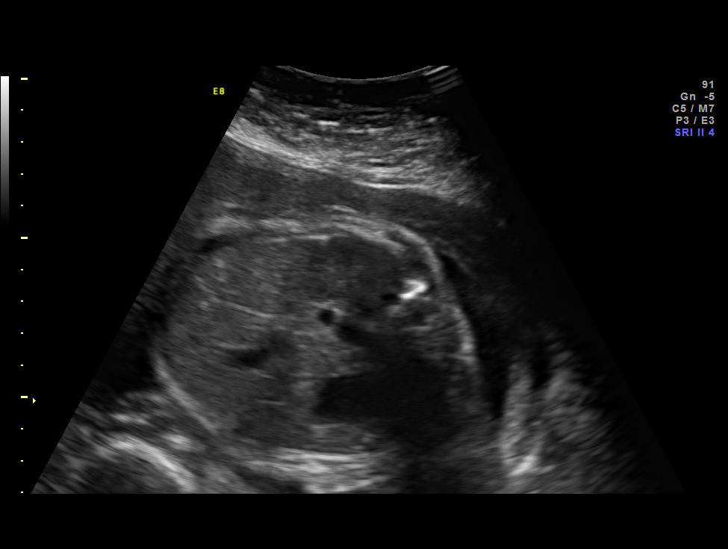
[im 24/34]
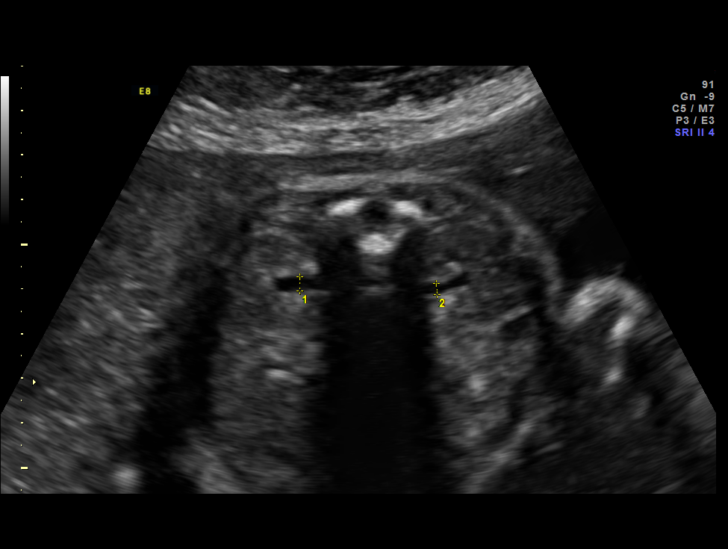
[im 27/34]
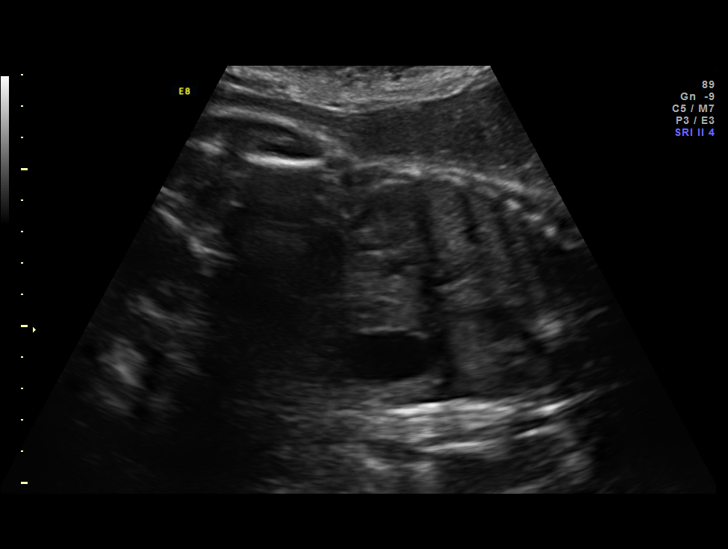
[im 30/34]
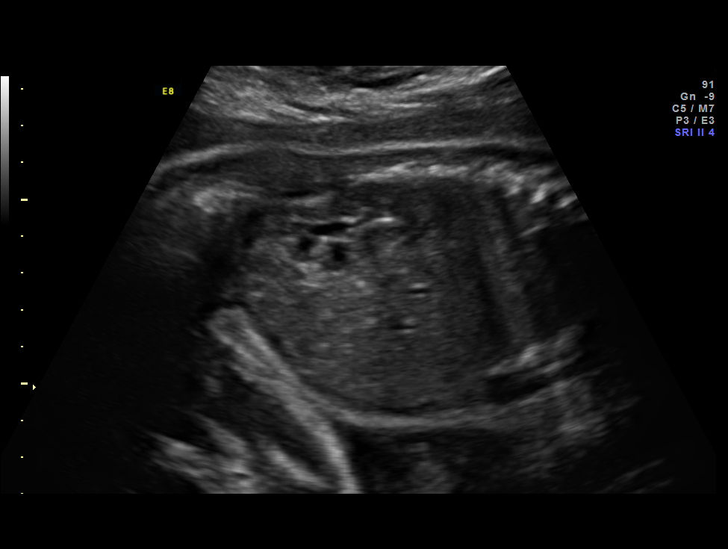
[im 32/34]
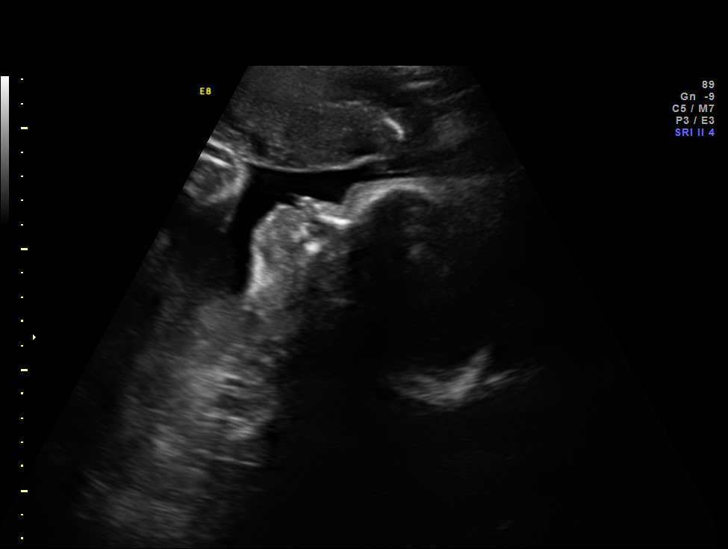

[12 of 28 positions shown; findings below may reference images not displayed]

OBSTETRICS REPORT
                      (Signed Final 05/28/2013 [DATE])

Service(s) Provided

 US OB FOLLOW UP                                       76816.1
Indications

 Fetal abnormality - other known or suspected
 (renal pyelectasis)
Fetal Evaluation

 Num Of Fetuses:    1
 Fetal Heart Rate:  134                          bpm
 Cardiac Activity:  Observed
 Presentation:      Cephalic
 Placenta:          Anterior, above cervical os
 P. Cord            Previously Visualized
 Insertion:

 Amniotic Fluid
 AFI FV:      Subjectively within normal limits
 AFI Sum:     15.59   cm       55  %Tile     Larg Pckt:    4.61  cm
 RUQ:   4.21    cm   RLQ:    4.61   cm    LUQ:   3.64    cm   LLQ:    3.13   cm
Biometry

 BPD:     86.5  mm     G. Age:  34w 6d                CI:         78.9   70 - 86
 OFD:    109.7  mm                                    FL/HC:      20.1   19.1 -

 HC:     312.2  mm     G. Age:  35w 0d       89  %    HC/AC:      1.03   0.96 -

 AC:       304  mm     G. Age:  34w 3d     > 97  %    FL/BPD:     72.5   71 - 87
 FL:      62.7  mm     G. Age:  32w 3d       54  %    FL/AC:      20.6   20 - 24
 HUM:     56.6  mm     G. Age:  32w 6d       71  %
 Est. FW:    0280  gm      5 lb 2 oz     84  %
Gestational Age

 LMP:           34w 3d        Date:  09/29/12                 EDD:   07/06/13
 U/S Today:     34w 1d                                        EDD:   07/08/13
 Best:          31w 6d     Det. By:  U/S C R L (12/14/12)     EDD:   07/24/13
Anatomy
 Cranium:          Previously seen        Aortic Arch:      Previously seen
 Fetal Cavum:      Previously seen        Ductal Arch:      Previously seen
 Ventricles:       Appears normal         Diaphragm:        Appears normal
 Choroid Plexus:   Previously seen        Stomach:          Appears normal, left
                                                            sided
 Cerebellum:       Previously seen        Abdomen:          Previously seen
 Posterior Fossa:  Previously seen        Abdominal Wall:   Previously seen
 Nuchal Fold:      Previously seen        Cord Vessels:     Previously seen
 Face:             Orbits and profile     Kidneys:          Appear normal
                   previously seen
 Lips:             Previously seen        Bladder:          Appears normal
 Heart:            Appears normal         Spine:            Previously seen
                   (4CH, axis, and
                   situs)
 RVOT:             Previously seen        Lower             Previously seen
                                          Extremities:
 LVOT:             Previously seen        Upper             Previously seen
                                          Extremities:

 Other:  Male gender.Heels previously visualized. Nasal bone previously
         visualized.
Cervix Uterus Adnexa

 Cervix:       Not visualized (advanced GA >81wks)
Comments

 Ms. Rubi was noted to have borderline fetal pyelectasis on
 her last scan.  No evidence of pyelectasis was seen on
 today's exam.  Nothing further is required for this issue.
Impression

 Single living intrauterine pregnancy at 31 weeks 6 days.
 Appropriate interval fetal growth (84%).
 Normal amniotic fluid volume.
 Normal interval fetal anatomy.
Recommendations

 Follow-up ultrasounds as clinically indicated.

 questions or concerns.
                Hamadi, Habone

## 2014-03-06 ENCOUNTER — Emergency Department (HOSPITAL_COMMUNITY)
Admission: EM | Admit: 2014-03-06 | Discharge: 2014-03-06 | Disposition: A | Discharge status: Home / Self Care | Payer: Medicaid Other | Attending: Emergency Medicine | Admitting: Emergency Medicine

## 2014-03-06 ENCOUNTER — Encounter (HOSPITAL_COMMUNITY): Payer: Self-pay | Admitting: Emergency Medicine

## 2014-03-06 DIAGNOSIS — K5289 Other specified noninfective gastroenteritis and colitis: Secondary | ICD-10-CM | POA: Insufficient documentation

## 2014-03-06 DIAGNOSIS — Z79899 Other long term (current) drug therapy: Secondary | ICD-10-CM | POA: Insufficient documentation

## 2014-03-06 DIAGNOSIS — R5383 Other fatigue: Secondary | ICD-10-CM

## 2014-03-06 DIAGNOSIS — Z87891 Personal history of nicotine dependence: Secondary | ICD-10-CM | POA: Insufficient documentation

## 2014-03-06 DIAGNOSIS — R5381 Other malaise: Secondary | ICD-10-CM | POA: Insufficient documentation

## 2014-03-06 DIAGNOSIS — K529 Noninfective gastroenteritis and colitis, unspecified: Secondary | ICD-10-CM

## 2014-03-06 DIAGNOSIS — J45909 Unspecified asthma, uncomplicated: Secondary | ICD-10-CM | POA: Insufficient documentation

## 2014-03-06 DIAGNOSIS — Z3202 Encounter for pregnancy test, result negative: Secondary | ICD-10-CM | POA: Insufficient documentation

## 2014-03-06 DIAGNOSIS — Z862 Personal history of diseases of the blood and blood-forming organs and certain disorders involving the immune mechanism: Secondary | ICD-10-CM | POA: Insufficient documentation

## 2014-03-06 HISTORY — DX: Iron deficiency anemia, unspecified: D50.9

## 2014-03-06 LAB — URINALYSIS, ROUTINE W REFLEX MICROSCOPIC
Bilirubin Urine: NEGATIVE
Glucose, UA: NEGATIVE mg/dL
Hgb urine dipstick: NEGATIVE
Ketones, ur: NEGATIVE mg/dL
Leukocytes, UA: NEGATIVE
Nitrite: NEGATIVE
Protein, ur: NEGATIVE mg/dL
Specific Gravity, Urine: 1.029 (ref 1.005–1.030)
Urobilinogen, UA: 1 mg/dL (ref 0.0–1.0)
pH: 7.5 (ref 5.0–8.0)

## 2014-03-06 LAB — COMPREHENSIVE METABOLIC PANEL
ALT: 14 U/L (ref 0–35)
AST: 15 U/L (ref 0–37)
Albumin: 4.3 g/dL (ref 3.5–5.2)
Alkaline Phosphatase: 89 U/L (ref 39–117)
BUN: 11 mg/dL (ref 6–23)
CO2: 25 mEq/L (ref 19–32)
Calcium: 9.2 mg/dL (ref 8.4–10.5)
Chloride: 100 mEq/L (ref 96–112)
Creatinine, Ser: 0.55 mg/dL (ref 0.50–1.10)
GFR calc Af Amer: >90 mL/min (ref >90)
GFR calc non Af Amer: >90 mL/min (ref >90)
Glucose, Bld: 90 mg/dL (ref 70–99)
Potassium: 4.1 mEq/L (ref 3.7–5.3)
Sodium: 140 mEq/L (ref 137–147)
Total Bilirubin: 0.2 mg/dL — ABNORMAL LOW (ref 0.3–1.2)
Total Protein: 8 g/dL (ref 6.0–8.3)

## 2014-03-06 LAB — CBC WITH DIFFERENTIAL/PLATELET
Basophils Absolute: 0 10*3/uL (ref 0.0–0.1)
Basophils Relative: 0 % (ref 0–1)
Eosinophils Absolute: 0.2 10*3/uL (ref 0.0–0.7)
Eosinophils Relative: 1 % (ref 0–5)
HCT: 44.1 % (ref 36.0–46.0)
Hemoglobin: 15.3 g/dL — ABNORMAL HIGH (ref 12.0–15.0)
Lymphocytes Relative: 13 % (ref 12–46)
Lymphs Abs: 1.8 10*3/uL (ref 0.7–4.0)
MCH: 29.7 pg (ref 26.0–34.0)
MCHC: 34.7 g/dL (ref 30.0–36.0)
MCV: 85.6 fL (ref 78.0–100.0)
Monocytes Absolute: 0.7 10*3/uL (ref 0.1–1.0)
Monocytes Relative: 5 % (ref 3–12)
Neutro Abs: 11.5 10*3/uL — ABNORMAL HIGH (ref 1.7–7.7)
Neutrophils Relative %: 81 % — ABNORMAL HIGH (ref 43–77)
Platelets: 302 10*3/uL (ref 150–400)
RBC: 5.15 MIL/uL — ABNORMAL HIGH (ref 3.87–5.11)
RDW: 14 % (ref 11.5–15.5)
WBC: 14.2 10*3/uL — ABNORMAL HIGH (ref 4.0–10.5)

## 2014-03-06 LAB — LIPASE, BLOOD: Lipase: 20 U/L (ref 11–59)

## 2014-03-06 LAB — POC URINE PREG, ED: Preg Test, Ur: NEGATIVE

## 2014-03-06 MED ORDER — DIPHENOXYLATE-ATROPINE 2.5-0.025 MG PO TABS
2.0000 | ORAL_TABLET | Freq: Once | ORAL | Status: AC
  Administered 2014-03-06: 2 via ORAL
  Filled 2014-03-06: qty 2

## 2014-03-06 MED ORDER — ONDANSETRON HCL 8 MG PO TABS: 8.0000 mg | ORAL_TABLET | Freq: Three times a day (TID) | ORAL | Status: DC | PRN

## 2014-03-06 MED ORDER — SODIUM CHLORIDE 0.9 % IV BOLUS (SEPSIS)
1000.0000 mL | Freq: Once | INTRAVENOUS | Status: AC
  Administered 2014-03-06: 1000 mL via INTRAVENOUS

## 2014-03-06 MED ORDER — DICYCLOMINE HCL 10 MG PO CAPS
20.0000 mg | ORAL_CAPSULE | Freq: Once | ORAL | Status: AC
  Administered 2014-03-06: 20 mg via ORAL
  Filled 2014-03-06: qty 2

## 2014-03-06 MED ORDER — DIPHENOXYLATE-ATROPINE 2.5-0.025 MG PO TABS: 1.0000 | ORAL_TABLET | Freq: Four times a day (QID) | ORAL | Status: DC | PRN

## 2014-03-06 MED ORDER — ONDANSETRON HCL 4 MG/2ML IJ SOLN
4.0000 mg | Freq: Once | INTRAMUSCULAR | Status: AC
  Administered 2014-03-06: 4 mg via INTRAVENOUS
  Filled 2014-03-06: qty 2

## 2014-03-06 NOTE — Discharge Instructions (Signed)
Viral Gastroenteritis °Viral gastroenteritis is also known as stomach flu. This condition affects the stomach and intestinal tract. It can cause sudden diarrhea and vomiting. The illness typically lasts 3 to 8 days. Most people develop an immune response that eventually gets rid of the virus. While this natural response develops, the virus can make you quite ill. °CAUSES  °Many different viruses can cause gastroenteritis, such as rotavirus or noroviruses. You can catch one of these viruses by consuming contaminated food or water. You may also catch a virus by sharing utensils or other personal items with an infected person or by touching a contaminated surface. °SYMPTOMS  °The most common symptoms are diarrhea and vomiting. These problems can cause a severe loss of body fluids (dehydration) and a body salt (electrolyte) imbalance. Other symptoms may include: °· Fever. °· Headache. °· Fatigue. °· Abdominal pain. °DIAGNOSIS  °Your caregiver can usually diagnose viral gastroenteritis based on your symptoms and a physical exam. A stool sample may also be taken to test for the presence of viruses or other infections. °TREATMENT  °This illness typically goes away on its own. Treatments are aimed at rehydration. The most serious cases of viral gastroenteritis involve vomiting so severely that you are not able to keep fluids down. In these cases, fluids must be given through an intravenous line (IV). °HOME CARE INSTRUCTIONS  °· Drink enough fluids to keep your urine clear or pale yellow. Drink small amounts of fluids frequently and increase the amounts as tolerated. °· Ask your caregiver for specific rehydration instructions. °· Avoid: °· Foods high in sugar. °· Alcohol. °· Carbonated drinks. °· Tobacco. °· Juice. °· Caffeine drinks. °· Extremely hot or cold fluids. °· Fatty, greasy foods. °· Too much intake of anything at one time. °· Dairy products until 24 to 48 hours after diarrhea stops. °· You may consume probiotics.  Probiotics are active cultures of beneficial bacteria. They may lessen the amount and number of diarrheal stools in adults. Probiotics can be found in yogurt with active cultures and in supplements. °· Wash your hands well to avoid spreading the virus. °· Only take over-the-counter or prescription medicines for pain, discomfort, or fever as directed by your caregiver. Do not give aspirin to children. Antidiarrheal medicines are not recommended. °· Ask your caregiver if you should continue to take your regular prescribed and over-the-counter medicines. °· Keep all follow-up appointments as directed by your caregiver. °SEEK IMMEDIATE MEDICAL CARE IF:  °· You are unable to keep fluids down. °· You do not urinate at least once every 6 to 8 hours. °· You develop shortness of breath. °· You notice blood in your stool or vomit. This may look like coffee grounds. °· You have abdominal pain that increases or is concentrated in one small area (localized). °· You have persistent vomiting or diarrhea. °· You have a fever. °· The patient is a child younger than 3 months, and he or she has a fever. °· The patient is a child older than 3 months, and he or she has a fever and persistent symptoms. °· The patient is a child older than 3 months, and he or she has a fever and symptoms suddenly get worse. °· The patient is a baby, and he or she has no tears when crying. °MAKE SURE YOU:  °· Understand these instructions. °· Will watch your condition. °· Will get help right away if you are not doing well or get worse. °Document Released: 12/09/2005 Document Revised: 03/02/2012 Document Reviewed: 09/25/2011 °  [employer] Patient Information 2014 [employer] [place].   Only take additional lomotil if your diarrhea persists.  This can make you constipated.

## 2014-03-06 NOTE — ED Notes (Signed)
Pt. woke up this morning with nausea, vomitting , diarrhea , chills and body aches . Respirations unlabored / alert and oriented.

## 2014-03-06 NOTE — ED Provider Notes (Signed)
CSN: [id number]     Arrival date & time 03/06/14  2006 History   First MD Initiated Contact with Patient 03/06/14 2024     Chief Complaint  Patient presents with  . Emesis  . Diarrhea     (Consider location/radiation/quality/duration/timing/severity/associated sxs/prior Treatment) HPI Comments: Caitlin Sullivan is a 23 y.o. Female presenting with nausea, vomiting (now just dry heaves) and nonbloody, watery brown diarrhea which she woke with today.  She also describes generalized fatigue along with body aches and chills. She describes abdominal cramping which eases briefly after bms.  She has had no medicines for her symptoms prior to arrival and has had no solid food today.  She has had small sips of fluid.  She has known gallstones,  Diagnosed when she was pregnant with her now 647 month old child.  She is not breast feeding.       The history is provided by the patient.    Past Medical History  Diagnosis Date  . Umbilical hernia   . Hyperemesis arising during pregnancy   . Asthma   . Gall stones   . Iron deficiency anemia    Past Surgical History  Procedure Laterality Date  . Pilonidal cyst / sinus excision     Family History  Problem Relation Age of Onset  . Cancer Mother   . Hypertension Mother   . Diabetes Father   . Hypertension Father   . Hyperlipidemia Father   . Stroke Father   . Asthma Brother    History  Substance Use Topics  . Smoking status: Former Smoker -- 0.25 packs/day    Types: Cigarettes    Quit date: 11/03/2012  . Smokeless tobacco: Never Used  . Alcohol Use: No   OB History   Grav Para Term Preterm Abortions TAB SAB Ect Mult Living   2 1 1  1 1    1      Review of Systems  Constitutional: Positive for chills. Negative for fever.  HENT: Negative for congestion and sore throat.   Eyes: Negative.   Respiratory: Negative for chest tightness and shortness of breath.   Cardiovascular: Negative for chest pain.  Gastrointestinal: Positive for nausea,  vomiting, abdominal pain and diarrhea.  Genitourinary: Negative.   Musculoskeletal: Positive for myalgias. Negative for arthralgias, joint swelling and neck pain.  Skin: Negative.  Negative for rash and wound.  Neurological: Negative for dizziness, weakness, light-headedness, numbness and headaches.  Psychiatric/Behavioral: Negative.       Allergies  Review of patient's allergies indicates no known allergies.  Home Medications   Current Outpatient Rx  Name  Route  Sig  Dispense  Refill  . albuterol (PROVENTIL HFA;VENTOLIN HFA) 108 (90 BASE) MCG/ACT inhaler   Inhalation   Inhale 1-2 puffs into the lungs every 6 (six) hours as needed. For wheezing or shortness of breath         . ibuprofen (ADVIL,MOTRIN) 600 MG tablet   Oral   Take 1 tablet (600 mg total) by mouth every 6 (six) hours.   30 tablet   0    BP 118/76  Pulse 88  Temp(Src) 97.8 F (36.6 C) (Oral)  Resp 17  Wt 220 lb (99.791 kg)  SpO2 95%  LMP 02/15/2014 Physical Exam  Nursing note and vitals reviewed. Constitutional: She appears well-developed and well-nourished.  HENT:  Head: Normocephalic and atraumatic.  Eyes: Conjunctivae are normal.  Neck: Normal range of motion.  Cardiovascular: Normal rate, regular rhythm, normal heart sounds and intact distal  pulses.   Pulmonary/Chest: Effort normal and breath sounds normal. She has no wheezes.  Abdominal: Soft. Bowel sounds are normal. She exhibits no distension. There is tenderness in the right upper quadrant, periumbilical area and left lower quadrant. There is no rigidity, no guarding, no CVA tenderness and negative Murphy's sign.  ttp without guarding.  Pain is greatest in her llq.  Musculoskeletal: Normal range of motion.  Neurological: She is alert.  Skin: Skin is warm and dry.  Psychiatric: She has a normal mood and affect.    ED Course  Procedures (including critical care time) Labs Review Labs Reviewed  CBC WITH DIFFERENTIAL - Abnormal; Notable for  the following:    WBC 14.2 (*)    RBC 5.15 (*)    Hemoglobin 15.3 (*)    Neutrophils Relative % 81 (*)    Neutro Abs 11.5 (*)    All other components within normal limits  COMPREHENSIVE METABOLIC PANEL - Abnormal; Notable for the following:    Total Bilirubin 0.2 (*)    All other components within normal limits  URINALYSIS, ROUTINE W REFLEX MICROSCOPIC  LIPASE, BLOOD  POC URINE PREG, ED   Imaging Review No results found.   EKG Interpretation None      MDM   Final diagnoses:  None    Patients labs and/or radiological studies were viewed and considered during the medical decision making and disposition process. Pt with nausea, vomiting, diarrhea with  abdominal discomfort without acute abdomen.  Hx c/w viral gastroenteritis.  Lfts and lipase normal, less likely this is related to her known cholelithiasis.  She was given IV fluids,  Also maintained PO fluids.  Given zofran and bentyl for cramping pain,  Lomotil dose given prior to dc home.  Prescription for same with zofran, encoruaged rest,  Increased fluid intake,  B.r.a.t. Diet, recheck if not improving over the next several days.  Pt also requested referral to general surgeon for further eval of her known gallstones.  Referred to [facility].  Pt was seen by Dr Juleen China during this ed visit.    Burgess Amor, PA-C 03/06/14 2329

## 2014-03-07 ENCOUNTER — Encounter (HOSPITAL_COMMUNITY): Payer: Self-pay | Admitting: Emergency Medicine

## 2014-03-07 ENCOUNTER — Emergency Department (HOSPITAL_COMMUNITY)
Admission: EM | Admit: 2014-03-07 | Discharge: 2014-03-07 | Disposition: A | Discharge status: Home / Self Care | Payer: Medicaid Other | Attending: Emergency Medicine | Admitting: Emergency Medicine

## 2014-03-07 DIAGNOSIS — Z87891 Personal history of nicotine dependence: Secondary | ICD-10-CM | POA: Insufficient documentation

## 2014-03-07 DIAGNOSIS — Z79899 Other long term (current) drug therapy: Secondary | ICD-10-CM | POA: Insufficient documentation

## 2014-03-07 DIAGNOSIS — K5289 Other specified noninfective gastroenteritis and colitis: Secondary | ICD-10-CM | POA: Insufficient documentation

## 2014-03-07 DIAGNOSIS — K529 Noninfective gastroenteritis and colitis, unspecified: Secondary | ICD-10-CM

## 2014-03-07 DIAGNOSIS — Z862 Personal history of diseases of the blood and blood-forming organs and certain disorders involving the immune mechanism: Secondary | ICD-10-CM | POA: Insufficient documentation

## 2014-03-07 DIAGNOSIS — J45909 Unspecified asthma, uncomplicated: Secondary | ICD-10-CM | POA: Insufficient documentation

## 2014-03-07 LAB — COMPREHENSIVE METABOLIC PANEL
ALBUMIN: 3.7 g/dL (ref 3.5–5.2)
ALT: 54 U/L — ABNORMAL HIGH (ref 0–35)
AST: 88 U/L — AB (ref 0–37)
Alkaline Phosphatase: 99 U/L (ref 39–117)
BUN: 9 mg/dL (ref 6–23)
CALCIUM: 8.4 mg/dL (ref 8.4–10.5)
CO2: 25 meq/L (ref 19–32)
CREATININE: 0.52 mg/dL (ref 0.50–1.10)
Chloride: 102 mEq/L (ref 96–112)
GFR calc Af Amer: >90 mL/min (ref >90)
Glucose, Bld: 103 mg/dL — ABNORMAL HIGH (ref 70–99)
Potassium: 3.5 mEq/L — ABNORMAL LOW (ref 3.7–5.3)
Sodium: 140 mEq/L (ref 137–147)
TOTAL PROTEIN: 7.2 g/dL (ref 6.0–8.3)
Total Bilirubin: 1 mg/dL (ref 0.3–1.2)

## 2014-03-07 LAB — CBC WITH DIFFERENTIAL/PLATELET
BASOS ABS: 0 10*3/uL (ref 0.0–0.1)
BASOS PCT: 0 % (ref 0–1)
EOS ABS: 0.1 10*3/uL (ref 0.0–0.7)
EOS PCT: 1 % (ref 0–5)
HEMATOCRIT: 40.6 % (ref 36.0–46.0)
Hemoglobin: 13.9 g/dL (ref 12.0–15.0)
Lymphocytes Relative: 16 % (ref 12–46)
Lymphs Abs: 1.5 10*3/uL (ref 0.7–4.0)
MCH: 29.5 pg (ref 26.0–34.0)
MCHC: 34.2 g/dL (ref 30.0–36.0)
MCV: 86.2 fL (ref 78.0–100.0)
MONO ABS: 0.5 10*3/uL (ref 0.1–1.0)
Monocytes Relative: 6 % (ref 3–12)
Neutro Abs: 7.4 10*3/uL (ref 1.7–7.7)
Neutrophils Relative %: 77 % (ref 43–77)
PLATELETS: 256 10*3/uL (ref 150–400)
RBC: 4.71 MIL/uL (ref 3.87–5.11)
RDW: 14.1 % (ref 11.5–15.5)
WBC: 9.5 10*3/uL (ref 4.0–10.5)

## 2014-03-07 LAB — URINALYSIS, ROUTINE W REFLEX MICROSCOPIC
Bilirubin Urine: NEGATIVE
GLUCOSE, UA: NEGATIVE mg/dL
Hgb urine dipstick: NEGATIVE
Ketones, ur: NEGATIVE mg/dL
Nitrite: NEGATIVE
PROTEIN: NEGATIVE mg/dL
SPECIFIC GRAVITY, URINE: 1.019 (ref 1.005–1.030)
Urobilinogen, UA: 1 mg/dL (ref 0.0–1.0)
pH: 7 (ref 5.0–8.0)

## 2014-03-07 LAB — URINE MICROSCOPIC-ADD ON

## 2014-03-07 LAB — LIPASE, BLOOD: Lipase: 23 U/L (ref 11–59)

## 2014-03-07 MED ORDER — SODIUM CHLORIDE 0.9 % IV BOLUS (SEPSIS)
1000.0000 mL | Freq: Once | INTRAVENOUS | Status: AC
Start: 2014-03-07 — End: 2014-03-07
  Administered 2014-03-07: 1000 mL via INTRAVENOUS

## 2014-03-07 MED ORDER — MORPHINE SULFATE 4 MG/ML IJ SOLN
4.0000 mg | Freq: Once | INTRAMUSCULAR | Status: AC
  Administered 2014-03-07: 4 mg via INTRAVENOUS
  Filled 2014-03-07: qty 1

## 2014-03-07 MED ORDER — HYDROMORPHONE HCL PF 1 MG/ML IJ SOLN
1.0000 mg | Freq: Once | INTRAMUSCULAR | Status: AC
  Administered 2014-03-07: 1 mg via INTRAVENOUS
  Filled 2014-03-07: qty 1

## 2014-03-07 MED ORDER — PROMETHAZINE HCL 25 MG/ML IJ SOLN
25.0000 mg | Freq: Once | INTRAMUSCULAR | Status: AC
  Administered 2014-03-07: 25 mg via INTRAVENOUS
  Filled 2014-03-07: qty 1

## 2014-03-07 MED ORDER — ONDANSETRON HCL 4 MG/2ML IJ SOLN
4.0000 mg | Freq: Once | INTRAMUSCULAR | Status: AC
  Administered 2014-03-07: 4 mg via INTRAVENOUS
  Filled 2014-03-07: qty 2

## 2014-03-07 MED ORDER — DIPHENHYDRAMINE HCL 50 MG/ML IJ SOLN
25.0000 mg | Freq: Once | INTRAMUSCULAR | Status: AC
  Administered 2014-03-07: 25 mg via INTRAVENOUS
  Filled 2014-03-07: qty 1

## 2014-03-07 MED ORDER — LORAZEPAM 2 MG/ML IJ SOLN
0.5000 mg | Freq: Once | INTRAMUSCULAR | Status: AC
  Administered 2014-03-07: 0.5 mg via INTRAVENOUS
  Filled 2014-03-07: qty 1

## 2014-03-07 NOTE — Discharge Instructions (Signed)
Take your prescriptions from yesterday as directed for your symptoms. Follow up with General Surgery for evaluation of your gallbladder. Refer to attached documents for more information. Return to the ED with worsening or concerning symptoms.

## 2014-03-07 NOTE — ED Provider Notes (Signed)
CSN: [id number]     Arrival date & time 03/07/14  4098 History   First MD Initiated Contact with Patient 03/07/14 [phone]     Chief Complaint  Patient presents with  . Abdominal Pain     (Consider location/radiation/quality/duration/timing/severity/associated sxs/prior Treatment) HPI Comments: Patient is a 23 year old female with a past medical history of cholelithiasis who presents with abdominal pain that started yesterday morning. The pain is located in her generalized abdomen and does not radiate. The pain is described as cramping and severe. The pain started gradually and progressively worsened since the onset. No alleviating/aggravating factors. The patient has tried nothing for symptoms without relief. Patient was seen in the ED last night and diagnosed with gastroenteritis. Associated symptoms include NVD. Patient denies fever, headache, chest pain, SOB, dysuria, constipation, abnormal vaginal bleeding/discharge.     Patient is a 23 y.o. female presenting with abdominal pain.  Abdominal Pain Associated symptoms: diarrhea, nausea and vomiting   Associated symptoms: no chest pain, no chills, no dysuria, no fatigue, no fever and no shortness of breath     Past Medical History  Diagnosis Date  . Umbilical hernia   . Hyperemesis arising during pregnancy   . Asthma   . Gall stones   . Iron deficiency anemia    Past Surgical History  Procedure Laterality Date  . Pilonidal cyst / sinus excision     Family History  Problem Relation Age of Onset  . Cancer Mother   . Hypertension Mother   . Diabetes Father   . Hypertension Father   . Hyperlipidemia Father   . Stroke Father   . Asthma Brother    History  Substance Use Topics  . Smoking status: Former Smoker -- 0.25 packs/day    Types: Cigarettes    Quit date: 11/03/2012  . Smokeless tobacco: Never Used  . Alcohol Use: No   OB History   Grav Para Term Preterm Abortions TAB SAB Ect Mult Living   2 1 1  1 1    1       Review of Systems  Constitutional: Negative for fever, chills and fatigue.  HENT: Negative for trouble swallowing.   Eyes: Negative for visual disturbance.  Respiratory: Negative for shortness of breath.   Cardiovascular: Negative for chest pain and palpitations.  Gastrointestinal: Positive for nausea, vomiting, abdominal pain and diarrhea.  Genitourinary: Negative for dysuria and difficulty urinating.  Musculoskeletal: Negative for arthralgias and neck pain.  Skin: Negative for color change.  Neurological: Negative for dizziness and weakness.  Psychiatric/Behavioral: Negative for dysphoric mood.      Allergies  Review of patient's allergies indicates no known allergies.  Home Medications   Current Outpatient Rx  Name  Route  Sig  Dispense  Refill  . albuterol (PROVENTIL HFA;VENTOLIN HFA) 108 (90 BASE) MCG/ACT inhaler   Inhalation   Inhale 1-2 puffs into the lungs every 6 (six) hours as needed. For wheezing or shortness of breath          BP 93/44  Pulse 50  Temp(Src) 98.7 F (37.1 C) (Oral)  Resp 18  Ht 5\' 5"  (1.651 m)  Wt 220 lb (99.791 kg)  BMI 36.61 kg/m2  SpO2 99%  LMP 02/15/2014 Physical Exam  Nursing note and vitals reviewed. Constitutional: She appears well-developed and well-nourished. No distress.  HENT:  Head: Normocephalic and atraumatic.  Eyes: Conjunctivae are normal.  Neck: Normal range of motion.  Cardiovascular: Normal rate and regular rhythm.  Exam reveals no gallop and  no friction rub.   No murmur heard. Pulmonary/Chest: Effort normal and breath sounds normal. She has no wheezes. She has no rales. She exhibits no tenderness.  Abdominal: Soft. She exhibits no distension. There is tenderness. There is no rebound and no guarding.  RUQ tenderness to palpation. Mild generalized tenderness to palpation. No other focal tenderness or peritoneal signs.   Musculoskeletal: Normal range of motion.  Neurological: She is alert.  Speech is goal-oriented.  Moves limbs without ataxia.   Skin: Skin is warm and dry.  Psychiatric: She has a normal mood and affect. Her behavior is normal.    ED Course  Procedures (including critical care time) Labs Review Labs Reviewed  COMPREHENSIVE METABOLIC PANEL - Abnormal; Notable for the following:    Potassium 3.5 (*)    Glucose, Bld 103 (*)    AST 88 (*)    ALT 54 (*)    All other components within normal limits  URINALYSIS, ROUTINE W REFLEX MICROSCOPIC - Abnormal; Notable for the following:    APPearance HAZY (*)    Leukocytes, UA TRACE (*)    All other components within normal limits  URINE MICROSCOPIC-ADD ON - Abnormal; Notable for the following:    Squamous Epithelial / LPF MANY (*)    Bacteria, UA MANY (*)    All other components within normal limits  CBC WITH DIFFERENTIAL  LIPASE, BLOOD   Imaging Review No results found.   EKG Interpretation None      MDM   Final diagnoses:  Gastroenteritis    9:19 AM Patient's labs show mild elevation in LFTs without any other acute changes. Patient likely having pain from cholelithiasis and gastroenteritis. Vitals stable and patient afebrile. Patient will have symptomatic treatment here in the ED. Patient has prescriptions at home that she has not filled. Patient also provided with contact info for a general surgeon for cholecystectomy.   12:15 PM Patient is feeling better and is ready for discharge. Vitals stable and patient afebrile. Patient will take her prescription medications from yesterday. Patient advised to follow up with general surgery. No further evaluation needed at this time.   Emilia BeckKaitlyn Taavi Hoose, PA-C 03/07/14 1221

## 2014-03-07 NOTE — ED Provider Notes (Signed)
History/physical exam/procedure(s) were performed by non-physician practitioner and as supervising physician I was immediately available for consultation/collaboration. I have reviewed all notes and am in agreement with care and plan.   Emma Schupp S Vonzell Lindblad, MD 03/07/14 1600 

## 2014-03-07 NOTE — ED Notes (Signed)
Patient is sleeping comfortably, this RN woke pt to administer pain meds and to obtain her level of pain.

## 2014-03-07 NOTE — ED Notes (Signed)
Pt reports upper abd pain starting in the RUQ and radiating across to her LUQ, nausea, vomiting, and diarrhea starting yesterday afternoon. Pt denies abnormal vaginal odor, discharge, bleeding or burning/pressure when urinating.

## 2014-03-07 NOTE — ED Notes (Signed)
Pt discharged home with all belongings, pt alert and ambulatory upon discharge, pt verbalizes understanding of discharge instructions, pt driven home by friend at bedside

## 2014-03-07 NOTE — ED Notes (Signed)
Pt states that she started experiencing abd pain yesterday morning. States that she was hear earlier tonight and was discharged and when she got home the pain got worse.

## 2014-03-08 NOTE — ED Provider Notes (Signed)
23 year old female with abdominal pain, nausea/vomiting and diarrhea. Abdominal exam shows right upper quadrant and left lower quadrant tenderness. Relatively mild. No guarding or rebound. No distention. Suspect viral process. Low suspicion for something more emergent. Patient has a past history known gallstones. Do not feel that is necessarily related to her current symptoms. She is to followup with the general surgeon. Return precautions were discussed. Symptomatic treatment otherwise.Medical screening examination/treatment/procedure(s) were conducted as a shared visit with non-physician practitioner(s) and myself.  I personally evaluated the patient during the encounter.   EKG Interpretation None       Raeford RazorStephen Nhi Butrum, MD 03/08/14 2204

## 2014-07-08 ENCOUNTER — Other Ambulatory Visit: Payer: Self-pay | Admitting: Obstetrics and Gynecology

## 2014-07-11 LAB — CYTOLOGY - PAP

## 2014-07-18 ENCOUNTER — Emergency Department (INDEPENDENT_AMBULATORY_CARE_PROVIDER_SITE_OTHER)
Admission: EM | Admit: 2014-07-18 | Discharge: 2014-07-18 | Disposition: A | Discharge status: Home / Self Care | Payer: Medicaid Other | Source: Home / Self Care | Attending: Family Medicine | Admitting: Family Medicine

## 2014-07-18 ENCOUNTER — Encounter (HOSPITAL_COMMUNITY): Payer: Self-pay | Admitting: Emergency Medicine

## 2014-07-18 DIAGNOSIS — S39012A Strain of muscle, fascia and tendon of lower back, initial encounter: Secondary | ICD-10-CM

## 2014-07-18 DIAGNOSIS — S335XXA Sprain of ligaments of lumbar spine, initial encounter: Secondary | ICD-10-CM

## 2014-07-18 MED ORDER — KETOROLAC TROMETHAMINE 30 MG/ML IJ SOLN
INTRAMUSCULAR | Status: AC
  Filled 2014-07-18: qty 1

## 2014-07-18 MED ORDER — KETOROLAC TROMETHAMINE 30 MG/ML IJ SOLN
30.0000 mg | Freq: Once | INTRAMUSCULAR | Status: AC
  Administered 2014-07-18: 30 mg via INTRAMUSCULAR

## 2014-07-18 MED ORDER — DICLOFENAC POTASSIUM 50 MG PO TABS
50.0000 mg | ORAL_TABLET | Freq: Two times a day (BID) | ORAL | Status: DC
Start: 2014-07-18 — End: 2014-09-22

## 2014-07-18 NOTE — ED Provider Notes (Signed)
Medical screening examination/treatment/procedure(s) were performed by a resident physician or non-physician practitioner and as the supervising physician I was immediately available for consultation/collaboration.  Shelly Flattenavid Merrell, MD Family Medicine   Ozella Rocksavid J Merrell, MD 07/18/14 2103

## 2014-07-18 NOTE — ED Notes (Signed)
2 separate events for back injury. Couple of days ago, bent over to pick up her fallen grandmother, felt a little pain, and 2 days ago set up toddler birthday party w a bouncy house. Since all this, has pain in lower back w radiation of pain , tingling in legs. Denies saddle anesthesia or incontinence

## 2014-07-18 NOTE — Discharge Instructions (Signed)

## 2014-07-18 NOTE — ED Provider Notes (Signed)
CSN: [id number]     Arrival date & time 07/18/14  1654 History   First MD Initiated Contact with Patient 07/18/14 1806     Chief Complaint  Patient presents with  . Back Pain   (Consider location/radiation/quality/duration/timing/severity/associated sxs/prior Treatment) HPI Comments: States she had had two events that have required heavy lifting over past 3 days. States 3 days ago her grandmother fell and she bent over to help stand her up and felt that she strained her lower back doing so. Then two days ago she was setting up her child's birthday party and had to carry bounce house from her car to her back yard and stating object was quite heavy and caused additional strain to her lower back.   Patient is a 23 y.o. female presenting with back pain. The history is provided by the patient.  Back Pain Location:  Lumbar spine Quality:  Aching and shooting Radiates to: bilateral buttocks. Pain severity:  Moderate Onset quality:  Gradual Duration:  2 days Timing:  Constant Progression:  Unchanged Chronicity:  New Relieved by: some improvement with 400mg  dose of ibuprofen taken today. Worsened by:  Movement and sitting Associated symptoms: no abdominal pain, no abdominal swelling, no bladder incontinence, no bowel incontinence, no dysuria, no fever, no leg pain, no numbness, no paresthesias, no pelvic pain, no perianal numbness, no tingling and no weakness   Associated symptoms comment:  Denies GI or GU sx No vag bleeding or discharge   Past Medical History  Diagnosis Date  . Umbilical hernia   . Hyperemesis arising during pregnancy   . Asthma   . Gall stones   . Iron deficiency anemia    Past Surgical History  Procedure Laterality Date  . Pilonidal cyst / sinus excision     Family History  Problem Relation Age of Onset  . Cancer Mother   . Hypertension Mother   . Diabetes Father   . Hypertension Father   . Hyperlipidemia Father   . Stroke Father   . Asthma Brother     History  Substance Use Topics  . Smoking status: Former Smoker -- 0.25 packs/day    Types: Cigarettes    Quit date: 11/03/2012  . Smokeless tobacco: Never Used  . Alcohol Use: No   OB History   Grav Para Term Preterm Abortions TAB SAB Ect Mult Living   2 1 1  1 1    1      Review of Systems  Constitutional: Negative for fever.  Gastrointestinal: Negative for abdominal pain and bowel incontinence.  Genitourinary: Negative for bladder incontinence, dysuria and pelvic pain.  Musculoskeletal: Positive for back pain.  Neurological: Negative for tingling, weakness, numbness and paresthesias.  All other systems reviewed and are negative.   Allergies  Review of patient's allergies indicates no known allergies.  Home Medications   Prior to Admission medications   Medication Sig Start Date End Date Taking? Authorizing Provider  albuterol (PROVENTIL HFA;VENTOLIN HFA) 108 (90 BASE) MCG/ACT inhaler Inhale 1-2 puffs into the lungs every 6 (six) hours as needed. For wheezing or shortness of breath 11/29/12   Vanetta Mulders, MD  diclofenac (CATAFLAM) 50 MG tablet Take 1 tablet (50 mg total) by mouth 2 (two) times daily. As needed for lower back pain 07/18/14   Jess Barters Loveah Like, PA   BP 104/62  Pulse 83  Temp(Src) 98.4 F (36.9 C) (Oral)  SpO2 98% Physical Exam  Nursing note and vitals reviewed. Constitutional: She is oriented to person, place, and  time. She appears well-developed and well-nourished. No distress.  HENT:  Head: Normocephalic and atraumatic.  Eyes: Conjunctivae are normal. No scleral icterus.  Cardiovascular: Normal rate, regular rhythm and normal heart sounds.   Pulmonary/Chest: Effort normal and breath sounds normal.  Abdominal: Soft. Bowel sounds are normal. She exhibits no distension. There is no tenderness.  Musculoskeletal: Normal range of motion.       Back:  CSM exam of bilateral lower extremities normal.   Neurological: She is alert and oriented to  person, place, and time. She has normal strength. No sensory deficit. Coordination and gait normal.  Reflex Scores:      Patellar reflexes are 2+ on the right side and 2+ on the left side. Skin: Skin is warm and dry. No rash noted. No erythema.  Psychiatric: She has a normal mood and affect. Her behavior is normal.    ED Course  Procedures (including critical care time) Labs Review Labs Reviewed - No data to display  Imaging Review No results found.   MDM   1. Lumbar strain, initial encounter   [facility] and Rx for Diclofenac 50mg  po BID for at home pain management. Advised to refrain from heavy lifting for next 1-2 weeks.    Jess BartersJennifer Lee Webster Groves[place], [place] 07/18/14 [phone]

## 2014-09-21 ENCOUNTER — Encounter (HOSPITAL_COMMUNITY): Payer: Self-pay | Admitting: *Deleted

## 2014-09-21 ENCOUNTER — Ambulatory Visit (INDEPENDENT_AMBULATORY_CARE_PROVIDER_SITE_OTHER): Payer: Self-pay | Admitting: General Surgery

## 2014-09-21 MED ORDER — CEFAZOLIN SODIUM-DEXTROSE 2-3 GM-% IV SOLR
2.0000 g | INTRAVENOUS | Status: AC
  Administered 2014-09-22: 2 g via INTRAVENOUS
  Filled 2014-09-21: qty 50

## 2014-09-21 NOTE — H&P (Signed)
History of Present Illness Caitlin Sullivan(Caitlin Damiani MD; 09/21/2014 3:42 PM) Patient words: gallbladder.  The patient is a 23 year old female who presents for evaluation of gall stones. The patient is a 23 year old female who is referred from the ER secondary to symptomatic cholelithiasis. The patient has had continued pain after giving birth approximately a year ago. The patient states she has had unable to tolerate no PO. Patient came in with nausea vomiting.   Other Problems Caitlin Sullivan(Caitlin Sullivan, Caitlin Sullivan; 09/21/2014 2:55 PM) Asthma Back Pain Cholelithiasis  Past Surgical History Caitlin Sullivan(Caitlin Sullivan, Caitlin Sullivan; 09/21/2014 2:55 PM) No pertinent past surgical history  Diagnostic Studies History Caitlin Sullivan(Caitlin Sullivan, Caitlin Sullivan; 09/21/2014 2:55 PM) Colonoscopy never Mammogram never Pap Smear 1-5 years ago  Allergies [facility](Caitlin Sullivan, RMA; 09/21/2014 12:47 PM) No Known Drug Allergies09/30/2015  Medication History (Caitlin Sullivan, RMA; 09/21/2014 2:56 PM) Albuterol Sulfate HFA (108 (90 Base)MCG/ACT Aerosol Soln, Inhalation) Active. Cataflam (50MG  Tablet, Oral) Active. Medications Reconciled  Social History Caitlin Sullivan(Caitlin Sullivan, Caitlin Sullivan; 09/21/2014 2:55 PM) Caffeine use Carbonated beverages. No alcohol use No drug use Tobacco use Current some day smoker.  Family History Caitlin Sullivan(Caitlin Sullivan, Caitlin Sullivan; 09/21/2014 2:55 PM) Arthritis Father. Breast Cancer Mother. Diabetes Mellitus Father, Mother. Hypertension Father, Mother. Migraine Headache Mother.  Pregnancy / Birth History Caitlin Sullivan(Caitlin ChesterfieldMaldonado, Caitlin Sullivan; 09/21/2014 2:55 PM) Age at menarche 14 years. Gravida 1 Maternal age 22-25 Para 1 Regular periods  Review of Systems (Caitlin Sullivan RMA; 09/21/2014 2:55 PM) General Present- Chills, Fatigue and Night Sweats. Not Present- Appetite Loss, Fever, Weight Gain and Weight Loss. Skin Not Present- Change in Wart/Mole, Dryness, Hives, Jaundice, New Lesions, Non-Healing  Wounds, Rash and Ulcer. Respiratory Present- Difficulty Breathing. Not Present- Bloody sputum, Chronic Cough, Snoring and Wheezing. Breast Present- Breast Pain. Not Present- Breast Mass, Nipple Discharge and Skin Changes. Cardiovascular Present- Rapid Heart Rate and Shortness of Breath. Not Present- Chest Pain, Difficulty Breathing Lying Down, Leg Cramps, Palpitations and Swelling of Extremities. Gastrointestinal Present- Abdominal Pain, Change in Bowel Habits, Nausea and Vomiting. Not Present- Bloating, Bloody Stool, Chronic diarrhea, Constipation, Difficulty Swallowing, Excessive gas, Gets full quickly at meals, Hemorrhoids, Indigestion and Rectal Pain. Female Genitourinary Present- Urgency. Not Present- Frequency, Nocturia, Painful Urination and Pelvic Pain. Musculoskeletal Present- Back Pain. Not Present- Joint Pain, Joint Stiffness, Muscle Pain, Muscle Weakness and Swelling of Extremities. Neurological Present- Fainting, Headaches, Numbness, Tingling and Weakness. Not Present- Decreased Memory, Seizures, Tremor and Trouble walking. Psychiatric Not Present- Anxiety, Bipolar, Change in Sleep Pattern, Depression, Fearful and Frequent crying. Endocrine Present- Excessive Hunger and Heat Intolerance. Not Present- Cold Intolerance, Hair Changes, Hot flashes and New Diabetes. Hematology Present- Easy Bruising. Not Present- Excessive bleeding, Gland problems, HIV and Persistent Infections.   Vitals (Caitlin Sullivan RMA; 09/21/2014 2:56 PM) 09/21/2014 2:55 PM Weight: 211.2 lb Height: 65in Body Surface Area: 2.1 m Body Mass Index: 35.15 kg/m Temp.: 98.28F(Oral)  Pulse: 65 (Regular)  P.OX: 98% (Room air) BP: 108/60 (Sitting, Left Arm, Standard)    Physical Exam Caitlin Sullivan(Caitlin Leverich MD; 09/21/2014 3:43 PM) General Mental Status-Alert. General Appearance-Consistent with stated age. Hydration-Well hydrated. Voice-Normal.  Head and Neck Head-normocephalic, atraumatic with  no lesions or palpable masses.  Eye Eyeball - Bilateral-Extraocular movements intact. Sclera/Conjunctiva - Bilateral-No scleral icterus.  Chest and Lung Exam Chest and lung exam reveals -quiet, even and easy respiratory effort with no use of accessory muscles and on auscultation, normal breath sounds, no adventitious sounds and normal vocal resonance. Inspection Chest Wall - Normal. Back - normal.  Cardiovascular Cardiovascular examination reveals -on palpation PMI is normal  in location and amplitude, no palpable S3 or S4. Normal cardiac borders., normal heart sounds, regular rate and rhythm with no murmurs, carotid auscultation reveals no bruits and normal pedal pulses bilaterally.  Abdomen Inspection Inspection of the abdomen reveals - No Hernias. Skin - Scar - no surgical scars. Palpation/Percussion Palpation and Percussion of the abdomen reveal - Soft, Non Tender, No Rebound tenderness, No Rigidity (guarding) and No hepatosplenomegaly. Auscultation Auscultation of the abdomen reveals - Bowel sounds normal.  Neurologic Neurologic evaluation reveals -alert and oriented x 3 with no impairment of recent or remote memory. Mental Status-Normal.  Musculoskeletal Normal Exam - Left-Upper Extremity Strength Normal and Lower Extremity Strength Normal. Normal Exam - Right-Upper Extremity Strength Normal, Lower Extremity Weakness.    Assessment & Plan Caitlin Filler MD; 09/21/2014 3:45 PM) CALCULUS OF GALLBLADDER WITH ACUTE CHOLECYSTITIS AND OBSTRUCTION (574.01  K80.01) Impression: 23 year old female with symptomatic cholelithiasis  We'll proccystectomy operating room for a laparoscopic cholecystectomy  Risks and benefits were discussed with the patient to generally include, but not limited to: infection, bleeding, possible need for post op ERCP, damage to the bile ducts, bile leak, and possible need for further surgery. Alternatives were offered and described. All  questions were answered and the patient voiced understanding of the procedure and wishes to proceed at this point with a laparoscopic cholecystectomy Current Plans  Schedule for Surgery

## 2014-09-22 ENCOUNTER — Ambulatory Visit (HOSPITAL_COMMUNITY): Payer: Medicaid Other | Admitting: Certified Registered"

## 2014-09-22 ENCOUNTER — Encounter (HOSPITAL_COMMUNITY): Payer: Self-pay | Admitting: *Deleted

## 2014-09-22 ENCOUNTER — Encounter (HOSPITAL_COMMUNITY): Payer: Medicaid Other | Admitting: Certified Registered"

## 2014-09-22 ENCOUNTER — Encounter (HOSPITAL_COMMUNITY)
Admission: RE | Disposition: A | Discharge status: Home / Self Care | Payer: Self-pay | Source: Ambulatory Visit | Attending: General Surgery

## 2014-09-22 ENCOUNTER — Ambulatory Visit (HOSPITAL_COMMUNITY)
Admission: RE | Admit: 2014-09-22 | Discharge: 2014-09-22 | Disposition: A | Discharge status: Home / Self Care | Payer: Medicaid Other | Source: Ambulatory Visit | Attending: General Surgery | Admitting: General Surgery

## 2014-09-22 ENCOUNTER — Emergency Department (HOSPITAL_COMMUNITY)
Admission: EM | Admit: 2014-09-22 | Discharge: 2014-09-22 | Disposition: A | Discharge status: Home / Self Care | Payer: Medicaid Other | Attending: Emergency Medicine | Admitting: Emergency Medicine

## 2014-09-22 DIAGNOSIS — R100 Acute abdomen: Secondary | ICD-10-CM | POA: Insufficient documentation

## 2014-09-22 DIAGNOSIS — K8020 Calculus of gallbladder without cholecystitis without obstruction: Secondary | ICD-10-CM | POA: Diagnosis present

## 2014-09-22 DIAGNOSIS — J45909 Unspecified asthma, uncomplicated: Secondary | ICD-10-CM | POA: Insufficient documentation

## 2014-09-22 DIAGNOSIS — Z8719 Personal history of other diseases of the digestive system: Secondary | ICD-10-CM | POA: Diagnosis not present

## 2014-09-22 DIAGNOSIS — Z862 Personal history of diseases of the blood and blood-forming organs and certain disorders involving the immune mechanism: Secondary | ICD-10-CM | POA: Diagnosis not present

## 2014-09-22 DIAGNOSIS — R011 Cardiac murmur, unspecified: Secondary | ICD-10-CM | POA: Insufficient documentation

## 2014-09-22 DIAGNOSIS — Z87891 Personal history of nicotine dependence: Secondary | ICD-10-CM | POA: Diagnosis not present

## 2014-09-22 DIAGNOSIS — T4145XA Adverse effect of unspecified anesthetic, initial encounter: Secondary | ICD-10-CM | POA: Diagnosis not present

## 2014-09-22 DIAGNOSIS — Z79899 Other long term (current) drug therapy: Secondary | ICD-10-CM | POA: Insufficient documentation

## 2014-09-22 DIAGNOSIS — R531 Weakness: Secondary | ICD-10-CM | POA: Diagnosis not present

## 2014-09-22 DIAGNOSIS — T8859XA Other complications of anesthesia, initial encounter: Secondary | ICD-10-CM

## 2014-09-22 HISTORY — PX: CHOLECYSTECTOMY: SHX55

## 2014-09-22 HISTORY — DX: Cardiac murmur, unspecified: R01.1

## 2014-09-22 HISTORY — DX: Gastro-esophageal reflux disease without esophagitis: K21.9

## 2014-09-22 LAB — COMPREHENSIVE METABOLIC PANEL
ALK PHOS: 110 U/L (ref 39–117)
ALT: 310 U/L — ABNORMAL HIGH (ref 0–35)
AST: 70 U/L — AB (ref 0–37)
Albumin: 4.1 g/dL (ref 3.5–5.2)
Anion gap: 17 — ABNORMAL HIGH (ref 5–15)
BUN: 4 mg/dL — ABNORMAL LOW (ref 6–23)
CO2: 20 meq/L (ref 19–32)
Calcium: 9.7 mg/dL (ref 8.4–10.5)
Chloride: 105 mEq/L (ref 96–112)
Creatinine, Ser: 0.46 mg/dL — ABNORMAL LOW (ref 0.50–1.10)
GFR calc non Af Amer: >90 mL/min (ref >90)
GLUCOSE: 117 mg/dL — AB (ref 70–99)
POTASSIUM: 3.8 meq/L (ref 3.7–5.3)
Sodium: 142 mEq/L (ref 137–147)
Total Bilirubin: 0.4 mg/dL (ref 0.3–1.2)
Total Protein: 8.1 g/dL (ref 6.0–8.3)

## 2014-09-22 LAB — CBC WITH DIFFERENTIAL/PLATELET
BASOS ABS: 0 10*3/uL (ref 0.0–0.1)
Basophils Relative: 0 % (ref 0–1)
Eosinophils Absolute: 0 10*3/uL (ref 0.0–0.7)
Eosinophils Relative: 0 % (ref 0–5)
HCT: 37.4 % (ref 36.0–46.0)
Hemoglobin: 12.7 g/dL (ref 12.0–15.0)
Lymphocytes Relative: 7 % — ABNORMAL LOW (ref 12–46)
Lymphs Abs: 0.9 10*3/uL (ref 0.7–4.0)
MCH: 29.2 pg (ref 26.0–34.0)
MCHC: 34 g/dL (ref 30.0–36.0)
MCV: 86 fL (ref 78.0–100.0)
Monocytes Absolute: 0.2 10*3/uL (ref 0.1–1.0)
Monocytes Relative: 1 % — ABNORMAL LOW (ref 3–12)
NEUTROS PCT: 92 % — AB (ref 43–77)
Neutro Abs: 11 10*3/uL — ABNORMAL HIGH (ref 1.7–7.7)
PLATELETS: 277 10*3/uL (ref 150–400)
RBC: 4.35 MIL/uL (ref 3.87–5.11)
RDW: 13.7 % (ref 11.5–15.5)
WBC: 12 10*3/uL — AB (ref 4.0–10.5)

## 2014-09-22 LAB — MAGNESIUM: Magnesium: 1.7 mg/dL (ref 1.5–2.5)

## 2014-09-22 LAB — RAPID URINE DRUG SCREEN, HOSP PERFORMED
AMPHETAMINES: NOT DETECTED
BENZODIAZEPINES: POSITIVE — AB
Barbiturates: NOT DETECTED
Cocaine: NOT DETECTED
Opiates: NOT DETECTED
Tetrahydrocannabinol: POSITIVE — AB

## 2014-09-22 LAB — CBC
HEMATOCRIT: 36.3 % (ref 36.0–46.0)
HEMOGLOBIN: 12 g/dL (ref 12.0–15.0)
MCH: 29.4 pg (ref 26.0–34.0)
MCHC: 33.1 g/dL (ref 30.0–36.0)
MCV: 89 fL (ref 78.0–100.0)
Platelets: 244 10*3/uL (ref 150–400)
RBC: 4.08 MIL/uL (ref 3.87–5.11)
RDW: 13.9 % (ref 11.5–15.5)
WBC: 7.8 10*3/uL (ref 4.0–10.5)

## 2014-09-22 LAB — ETHANOL: Alcohol, Ethyl (B): <11 mg/dL (ref 0–11)

## 2014-09-22 LAB — HCG, SERUM, QUALITATIVE: PREG SERUM: POSITIVE — AB

## 2014-09-22 LAB — CK: Total CK: 95 U/L (ref 7–177)

## 2014-09-22 SURGERY — LAPAROSCOPIC CHOLECYSTECTOMY
Anesthesia: General | Site: Abdomen

## 2014-09-22 MED ORDER — OXYCODONE HCL 5 MG PO TABS: 5.0000 mg | ORAL_TABLET | Freq: Once | ORAL | Status: DC | PRN

## 2014-09-22 MED ORDER — DEXAMETHASONE SODIUM PHOSPHATE 4 MG/ML IJ SOLN
INTRAMUSCULAR | Status: DC | PRN
  Administered 2014-09-22: 8 mg via INTRAVENOUS

## 2014-09-22 MED ORDER — FENTANYL CITRATE 0.05 MG/ML IJ SOLN
INTRAMUSCULAR | Status: AC
  Filled 2014-09-22: qty 5

## 2014-09-22 MED ORDER — BUPIVACAINE HCL 0.25 % IJ SOLN
INTRAMUSCULAR | Status: DC | PRN
  Administered 2014-09-22: 4 mL

## 2014-09-22 MED ORDER — SODIUM CHLORIDE 0.9 % IR SOLN
Status: DC | PRN
  Administered 2014-09-22: 1000 mL

## 2014-09-22 MED ORDER — LORAZEPAM 2 MG/ML IJ SOLN
1.0000 mg | Freq: Once | INTRAMUSCULAR | Status: AC
  Administered 2014-09-22: 1 mg via INTRAVENOUS

## 2014-09-22 MED ORDER — 0.9 % SODIUM CHLORIDE (POUR BTL) OPTIME
TOPICAL | Status: DC | PRN
  Administered 2014-09-22: 1000 mL

## 2014-09-22 MED ORDER — NEOSTIGMINE METHYLSULFATE 10 MG/10ML IV SOLN
INTRAVENOUS | Status: DC | PRN
  Administered 2014-09-22: 3 mg via INTRAVENOUS

## 2014-09-22 MED ORDER — LORAZEPAM 2 MG/ML IJ SOLN
INTRAMUSCULAR | Status: AC
  Administered 2014-09-22: 1 mg via INTRAVENOUS
  Filled 2014-09-22: qty 1

## 2014-09-22 MED ORDER — LACTATED RINGERS IV SOLN
INTRAVENOUS | Status: DC | PRN
  Administered 2014-09-22 (×2): via INTRAVENOUS

## 2014-09-22 MED ORDER — OXYCODONE-ACETAMINOPHEN 5-325 MG PO TABS: 1.0000 | ORAL_TABLET | ORAL | Status: DC | PRN

## 2014-09-22 MED ORDER — PROMETHAZINE HCL 25 MG/ML IJ SOLN
INTRAMUSCULAR | Status: DC
Start: 2014-09-22 — End: 2014-09-22
  Filled 2014-09-22: qty 1

## 2014-09-22 MED ORDER — MIDAZOLAM HCL 2 MG/2ML IJ SOLN
INTRAMUSCULAR | Status: AC
  Filled 2014-09-22: qty 2

## 2014-09-22 MED ORDER — PROPOFOL 10 MG/ML IV BOLUS
INTRAVENOUS | Status: DC | PRN
  Administered 2014-09-22: 150 mg via INTRAVENOUS
  Administered 2014-09-22: 50 mg via INTRAVENOUS

## 2014-09-22 MED ORDER — BUPIVACAINE HCL (PF) 0.25 % IJ SOLN
INTRAMUSCULAR | Status: AC
  Filled 2014-09-22: qty 30

## 2014-09-22 MED ORDER — OXYCODONE-ACETAMINOPHEN 5-325 MG PO TABS
2.0000 | ORAL_TABLET | Freq: Once | ORAL | Status: AC
  Administered 2014-09-22: 2 via ORAL
  Filled 2014-09-22: qty 2

## 2014-09-22 MED ORDER — ONDANSETRON HCL 4 MG/2ML IJ SOLN
INTRAMUSCULAR | Status: DC | PRN
  Administered 2014-09-22: 4 mg via INTRAVENOUS

## 2014-09-22 MED ORDER — SCOPOLAMINE 1 MG/3DAYS TD PT72
MEDICATED_PATCH | TRANSDERMAL | Status: AC
  Filled 2014-09-22: qty 1

## 2014-09-22 MED ORDER — PROPOFOL 10 MG/ML IV BOLUS
INTRAVENOUS | Status: AC
  Filled 2014-09-22: qty 20

## 2014-09-22 MED ORDER — FENTANYL CITRATE 0.05 MG/ML IJ SOLN
INTRAMUSCULAR | Status: DC | PRN
  Administered 2014-09-22 (×2): 50 ug via INTRAVENOUS
  Administered 2014-09-22: 150 ug via INTRAVENOUS
  Administered 2014-09-22: 100 ug via INTRAVENOUS
  Administered 2014-09-22: 50 ug via INTRAVENOUS
  Administered 2014-09-22: 100 ug via INTRAVENOUS

## 2014-09-22 MED ORDER — HYDROMORPHONE HCL 1 MG/ML IJ SOLN
INTRAMUSCULAR | Status: DC
Start: 2014-09-22 — End: 2014-09-22
  Filled 2014-09-22: qty 1

## 2014-09-22 MED ORDER — GLYCOPYRROLATE 0.2 MG/ML IJ SOLN
INTRAMUSCULAR | Status: DC | PRN
  Administered 2014-09-22: 0.4 mg via INTRAVENOUS

## 2014-09-22 MED ORDER — SCOPOLAMINE 1 MG/3DAYS TD PT72
1.0000 | MEDICATED_PATCH | Freq: Once | TRANSDERMAL | Status: AC
  Administered 2014-09-22: 1 via TRANSDERMAL

## 2014-09-22 MED ORDER — DIPHENHYDRAMINE HCL 50 MG/ML IJ SOLN
INTRAMUSCULAR | Status: AC
  Filled 2014-09-22: qty 1

## 2014-09-22 MED ORDER — ONDANSETRON HCL 4 MG/2ML IJ SOLN
INTRAMUSCULAR | Status: AC
  Filled 2014-09-22: qty 2

## 2014-09-22 MED ORDER — DEXAMETHASONE SODIUM PHOSPHATE 4 MG/ML IJ SOLN
INTRAMUSCULAR | Status: AC
  Filled 2014-09-22: qty 2

## 2014-09-22 MED ORDER — LIDOCAINE HCL (CARDIAC) 20 MG/ML IV SOLN
INTRAVENOUS | Status: DC | PRN
  Administered 2014-09-22: 40 mg via INTRAVENOUS

## 2014-09-22 MED ORDER — GLYCOPYRROLATE 0.2 MG/ML IJ SOLN
INTRAMUSCULAR | Status: AC
  Filled 2014-09-22: qty 2

## 2014-09-22 MED ORDER — DIPHENHYDRAMINE HCL 50 MG/ML IJ SOLN
INTRAMUSCULAR | Status: DC | PRN
  Administered 2014-09-22: 10 mg via INTRAVENOUS

## 2014-09-22 MED ORDER — ROCURONIUM BROMIDE 50 MG/5ML IV SOLN
INTRAVENOUS | Status: AC
Start: 2014-09-22 — End: 2014-09-22
  Filled 2014-09-22: qty 1

## 2014-09-22 MED ORDER — OXYCODONE HCL 5 MG/5ML PO SOLN: 5.0000 mg | Freq: Once | ORAL | Status: DC | PRN

## 2014-09-22 MED ORDER — SUCCINYLCHOLINE CHLORIDE 20 MG/ML IJ SOLN
INTRAMUSCULAR | Status: AC
  Filled 2014-09-22: qty 1

## 2014-09-22 MED ORDER — HYDROMORPHONE HCL 1 MG/ML IJ SOLN
INTRAMUSCULAR | Status: AC
  Filled 2014-09-22: qty 1

## 2014-09-22 MED ORDER — ROCURONIUM BROMIDE 100 MG/10ML IV SOLN
INTRAVENOUS | Status: DC | PRN
  Administered 2014-09-22: 30 mg via INTRAVENOUS

## 2014-09-22 MED ORDER — HYDROMORPHONE HCL 1 MG/ML IJ SOLN
0.2500 mg | INTRAMUSCULAR | Status: DC | PRN
  Administered 2014-09-22 (×2): 0.5 mg via INTRAVENOUS
  Administered 2014-09-22: 0.25 mg via INTRAVENOUS

## 2014-09-22 MED ORDER — LIDOCAINE HCL (CARDIAC) 20 MG/ML IV SOLN
INTRAVENOUS | Status: AC
  Filled 2014-09-22: qty 5

## 2014-09-22 MED ORDER — NEOSTIGMINE METHYLSULFATE 10 MG/10ML IV SOLN
INTRAVENOUS | Status: AC
  Filled 2014-09-22: qty 1

## 2014-09-22 MED ORDER — PROMETHAZINE HCL 25 MG/ML IJ SOLN
6.2500 mg | Freq: Once | INTRAMUSCULAR | Status: AC
  Administered 2014-09-22: 6.25 mg via INTRAVENOUS

## 2014-09-22 MED ORDER — MIDAZOLAM HCL 5 MG/5ML IJ SOLN
INTRAMUSCULAR | Status: DC | PRN
  Administered 2014-09-22 (×3): 1 mg via INTRAVENOUS

## 2014-09-22 SURGICAL SUPPLY — 48 items
BENZOIN TINCTURE PRP APPL 2/3 (GAUZE/BANDAGES/DRESSINGS) ×3 IMPLANT
CANISTER SUCTION 2500CC (MISCELLANEOUS) ×3 IMPLANT
CHLORAPREP W/TINT 26ML (MISCELLANEOUS) ×3 IMPLANT
CLIP LIGATING HEMO O LOK GREEN (MISCELLANEOUS) ×3 IMPLANT
CLOSURE WOUND 1/2 X4 (GAUZE/BANDAGES/DRESSINGS) ×1
COVER MAYO STAND STRL (DRAPES) IMPLANT
COVER SURGICAL LIGHT HANDLE (MISCELLANEOUS) ×3 IMPLANT
COVER TRANSDUCER ULTRASND (DRAPES) ×3 IMPLANT
DEVICE TROCAR PUNCTURE CLOSURE (ENDOMECHANICALS) ×3 IMPLANT
DRAPE C-ARM 42X72 X-RAY (DRAPES) IMPLANT
DRAPE UTILITY 15X26 W/TAPE STR (DRAPE) ×6 IMPLANT
ELECT REM PT RETURN 9FT ADLT (ELECTROSURGICAL) ×3
ELECTRODE REM PT RTRN 9FT ADLT (ELECTROSURGICAL) ×1 IMPLANT
GAUZE SPONGE 2X2 8PLY STRL LF (GAUZE/BANDAGES/DRESSINGS) ×1 IMPLANT
GLOVE BIO SURGEON STRL SZ 6.5 (GLOVE) ×2 IMPLANT
GLOVE BIO SURGEON STRL SZ7 (GLOVE) ×3 IMPLANT
GLOVE BIO SURGEON STRL SZ7.5 (GLOVE) ×3 IMPLANT
GLOVE BIO SURGEONS STRL SZ 6.5 (GLOVE) ×1
GLOVE BIOGEL PI IND STRL 6.5 (GLOVE) ×1 IMPLANT
GLOVE BIOGEL PI IND STRL 7.0 (GLOVE) ×2 IMPLANT
GLOVE BIOGEL PI INDICATOR 6.5 (GLOVE) ×2
GLOVE BIOGEL PI INDICATOR 7.0 (GLOVE) ×4
GOWN STRL REUS W/ TWL LRG LVL3 (GOWN DISPOSABLE) ×2 IMPLANT
GOWN STRL REUS W/ TWL XL LVL3 (GOWN DISPOSABLE) ×1 IMPLANT
GOWN STRL REUS W/TWL LRG LVL3 (GOWN DISPOSABLE) ×4
GOWN STRL REUS W/TWL XL LVL3 (GOWN DISPOSABLE) ×2
IV CATH 14GX2 1/4 (CATHETERS) IMPLANT
KIT BASIN OR (CUSTOM PROCEDURE TRAY) ×3 IMPLANT
KIT ROOM TURNOVER OR (KITS) ×3 IMPLANT
NEEDLE INSUFFLATION 14GA 120MM (NEEDLE) ×3 IMPLANT
NS IRRIG 1000ML POUR BTL (IV SOLUTION) ×3 IMPLANT
PAD ARMBOARD 7.5X6 YLW CONV (MISCELLANEOUS) ×6 IMPLANT
POUCH SPECIMEN RETRIEVAL 10MM (ENDOMECHANICALS) IMPLANT
SCISSORS LAP 5X35 DISP (ENDOMECHANICALS) ×3 IMPLANT
SET CHOLANGIOGRAPHY FRANKLIN (SET/KITS/TRAYS/PACK) IMPLANT
SET IRRIG TUBING LAPAROSCOPIC (IRRIGATION / IRRIGATOR) ×3 IMPLANT
SLEEVE ENDOPATH XCEL 5M (ENDOMECHANICALS) ×6 IMPLANT
SPECIMEN JAR SMALL (MISCELLANEOUS) ×3 IMPLANT
SPONGE GAUZE 2X2 STER 10/PKG (GAUZE/BANDAGES/DRESSINGS) ×2
STRIP CLOSURE SKIN 1/2X4 (GAUZE/BANDAGES/DRESSINGS) ×2 IMPLANT
SUT MNCRL AB 3-0 PS2 18 (SUTURE) ×3 IMPLANT
TAPE CLOTH SOFT 2X10 (GAUZE/BANDAGES/DRESSINGS) ×3 IMPLANT
TOWEL OR 17X24 6PK STRL BLUE (TOWEL DISPOSABLE) ×3 IMPLANT
TOWEL OR 17X26 10 PK STRL BLUE (TOWEL DISPOSABLE) ×3 IMPLANT
TRAY LAPAROSCOPIC (CUSTOM PROCEDURE TRAY) ×3 IMPLANT
TROCAR XCEL NON-BLD 11X100MML (ENDOMECHANICALS) ×3 IMPLANT
TROCAR XCEL NON-BLD 5MMX100MML (ENDOMECHANICALS) ×3 IMPLANT
TUBING INSUFFLATION (TUBING) ×3 IMPLANT

## 2014-09-22 NOTE — ED Notes (Addendum)
Pt states that she "cant feel my stomach. It feels really tight". Abdomen soft to palpation. NAD noted. VVS. Pt states that extremities are numb and she cant move them. Pt noted to move legs and fingers/hands. MD aware

## 2014-09-22 NOTE — Discharge Instructions (Signed)
Cholecystostomy  The gallbladder is a pear-shaped organ that lies beneath the liver on the right side of the body. The gallbladder stores bile, a fluid that helps the body digest fats. However, sometimes bile and other fluids build up in the gallbladder because of an obstruction (for example, a gallstones). This can cause fever, pain, swelling, nausea and other serious symptoms. The procedure used to drain these fluids is called a cholecystostomy. A tube is inserted into the gallbladder. Fluid drains through the tube into a plastic bag outside the body. This procedure is usually done on people who are admitted to the hospital. The procedure is often recommended for people who cannot have gallbladder surgery right away, usually because they are too ill to make it through surgery. The cholecystostomy tube is usually temporary, until surgery can be done. RISKS AND COMPLICATIONS Although rare, complications can include:  Clogging of the tube.  Infection in or around the drain site. Antibiotics might be prescribed for the infection. Or, another tube might be inserted to drain the infected fluid.  Internal bleeding from the liver. BEFORE THE PROCEDURE   Try to quit smoking several weeks before the procedure. Smoking can slow healing.  Arrange for someone to drive you home from the hospital.  Right before your procedure, avoid all foods and liquids after midnight. This includes coffee, tea and water.  On the day of the procedure, arrive early to fill out all the paperwork. PROCEDURE You will be given a sedative to make you sleepy and a local anesthetic to numb the skin. Next, a small cut is made in the abdomen. Then a tube is threaded through the cut into the gallbladder. The procedure is usually done with ultrasound to guide the tube into the gallbladder. Once the tube is in place, the drain is secured to the skin with a stitch. The tube is then connected to a drainage bag.  AFTER THE PROCEDURE    People who have a cholecystostomy usually stay in the hospital for several days because they are so ill. You might not be able to eat for the first few days. Instead, you will be connected to an IV for fluids and nutrients.  The procedure does not cure the blockage that caused the fluid to build up in the first place. Because of this, the gallbladder will need to be removed in the future. The drain is removed at that time. HOME CARE INSTRUCTIONS  Be sure to follow your healthcare provider's instructions carefully. You may shower but avoid tub baths and swimming until your caregiver says it is OK. Eat and drink according to the directions you have been given. And be sure to make all follow-up appointments.  Call your healthcare provider if you notice new pain, redness or swelling around the wound. SEEK IMMEDIATE MEDICAL CARE IF:   There is increased abdominal pain.  Nausea or vomiting occurs.  You develop a fever.  The drainage tube comes out of the abdomen. Document Released: 03/07/2009 Document Revised: 03/02/2012 Document Reviewed: 03/07/2009 [facility] Patient Information 2015 [place], [place]. This information is not intended to replace advice given to you by your health care provider. Make sure you discuss any questions you have with your health care provider.   General Anesthesia General anesthesia is a sleep-like state of non-feeling produced by medicines (anesthetics). General anesthesia prevents you from being alert and feeling pain during a medical procedure. Your caregiver may recommend general anesthesia if your procedure:  Is long.  Is painful or uncomfortable.  Would be frightening to see or hear.  Requires you to be still.  Affects your breathing.  Causes significant blood loss. LET YOUR CAREGIVER KNOW ABOUT:  Allergies to food or medicine.  Medicines taken, including vitamins, herbs, eyedrops, over-the-counter medicines, and creams.  Use of steroids (by mouth  or creams).  Previous problems with anesthetics or numbing medicines, including problems experienced by relatives.  History of bleeding problems or blood clots.  Previous surgeries and types of anesthetics received.  Possibility of pregnancy, if this applies.  Use of cigarettes, alcohol, or illegal drugs.  Any health condition(s), especially diabetes, sleep apnea, and high blood pressure. RISKS AND COMPLICATIONS General anesthesia rarely causes complications. However, if complications do occur, they can be life threatening. Complications include:  A lung infection.  A stroke.  A heart attack.  Waking up during the procedure. When this occurs, the patient may be unable to move and communicate that he or she is awake. The patient may feel severe pain. Older adults and adults with serious medical problems are more likely to have complications than adults who are young and healthy. Some complications can be prevented by answering all of your caregiver's questions thoroughly and by following all pre-procedure instructions. It is important to tell your caregiver if any of the pre-procedure instructions, especially those related to diet, were not followed. Any food or liquid in the stomach can cause problems when you are under general anesthesia. BEFORE THE PROCEDURE  Ask your caregiver if you will have to spend the night at the hospital. If you will not have to spend the night, arrange to have an adult drive you and stay with you for 24 hours.  Follow your caregiver's instructions if you are taking dietary supplements or medicines. Your caregiver may tell you to stop taking them or to reduce your dosage.  Do not smoke for as long as possible before your procedure. If possible, stop smoking 3-6 weeks before the procedure.  Do not take new dietary supplements or medicines within 1 week of your procedure unless your caregiver approves them.  Do not eat within 8 hours of your procedure or as  directed by your caregiver. Drink only clear liquids, such as water, black coffee (without milk or cream), and fruit juices (without pulp).  Do not drink within 3 hours of your procedure or as directed by your caregiver.  You may brush your teeth on the morning of the procedure, but make sure to spit out the toothpaste and water when finished. PROCEDURE  You will receive anesthetics through a mask, through an intravenous (IV) access tube, or through both. A doctor who specializes in anesthesia (anesthesiologist) or a nurse who specializes in anesthesia (nurse anesthetist) or both will stay with you throughout the procedure to make sure you remain unconscious. He or she will also watch your blood pressure, pulse, and oxygen levels to make sure that the anesthetics do not cause any problems. Once you are asleep, a breathing tube or mask may be used to help you breathe. AFTER THE PROCEDURE You will wake up after the procedure is complete. You may be in the room where the procedure was performed or in a recovery area. You may have a sore throat if a breathing tube was used. You may also feel:  Dizzy.  Weak.  Drowsy.  Confused.  Nauseous.  Cold. These are all normal responses and can be expected to last for up to 24 hours after the procedure is complete. A caregiver will  tell you when you are ready to go home. This will usually be when you are fully awake and in stable condition. Document Released: 03/17/2008 Document Revised: 04/25/2014 Document Reviewed: 04/08/2012 [facility] Patient Information 2015 [place]. This information is not intended to replace advice given to you by your health care provider. Make sure you discuss any questions you have with your health care provider.

## 2014-09-22 NOTE — Transfer of Care (Signed)
Immediate Anesthesia Transfer of Care Note  Patient: Caitlin Sullivan  Procedure(s) Performed: Procedure(s): LAPAROSCOPIC CHOLECYSTECTOMY (N/A)  Patient Location: PACU  Anesthesia Type:General  Level of Consciousness: awake  Airway & Oxygen Therapy: Patient Spontanous Breathing and Patient connected to nasal cannula oxygen  Post-op Assessment: Report given to PACU RN, Post -op Vital signs reviewed and stable and Patient moving all extremities  Post vital signs: Reviewed and stable  Complications: No apparent anesthesia complications

## 2014-09-22 NOTE — ED Notes (Signed)
Pt. Taken back to Trauma B. Resident at bedside.
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
## 2014-09-22 NOTE — ED Notes (Signed)
Pt states that she is having pain at her incision sites MD aware.

## 2014-09-22 NOTE — H&P (View-Only) (Signed)
History of Present Illness (Belita Warsame MD; 09/21/2014 3:42 PM) Patient words: gallbladder.  The patient is a 23 year old female who presents for evaluation of gall stones. The patient is a 23-year-old female who is referred from the ER secondary to symptomatic cholelithiasis. The patient has had continued pain after giving birth approximately a year ago. The patient states she has had unable to tolerate no PO. Patient came in with nausea vomiting.   Other Problems (Dahionnarah Maldonado, RMA; 09/21/2014 2:55 PM) Asthma Back Pain Cholelithiasis  Past Surgical History (Dahionnarah Maldonado, RMA; 09/21/2014 2:55 PM) No pertinent past surgical history  Diagnostic Studies History (Dahionnarah Maldonado, RMA; 09/21/2014 2:55 PM) Colonoscopy never Mammogram never Pap Smear 1-5 years ago  Allergies (Dahionnarah Maldonado, RMA; 09/21/2014 12:47 PM) No Known Drug Allergies09/30/2015  Medication History (Dahionnarah Maldonado, RMA; 09/21/2014 2:56 PM) Albuterol Sulfate HFA (108 (90 Base)MCG/ACT Aerosol Soln, Inhalation) Active. Cataflam (50MG Tablet, Oral) Active. Medications Reconciled  Social History (Dahionnarah Maldonado, RMA; 09/21/2014 2:55 PM) Caffeine use Carbonated beverages. No alcohol use No drug use Tobacco use Current some day smoker.  Family History (Dahionnarah Maldonado, RMA; 09/21/2014 2:55 PM) Arthritis Father. Breast Cancer Mother. Diabetes Mellitus Father, Mother. Hypertension Father, Mother. Migraine Headache Mother.  Pregnancy / Birth History (Dahionnarah Maldonado, RMA; 09/21/2014 2:55 PM) Age at menarche 14 years. Gravida 1 Maternal age 21-25 Para 1 Regular periods  Review of Systems (Dahionnarah Maldonado RMA; 09/21/2014 2:55 PM) General Present- Chills, Fatigue and Night Sweats. Not Present- Appetite Loss, Fever, Weight Gain and Weight Loss. Skin Not Present- Change in Wart/Mole, Dryness, Hives, Jaundice, New Lesions, Non-Healing  Wounds, Rash and Ulcer. Respiratory Present- Difficulty Breathing. Not Present- Bloody sputum, Chronic Cough, Snoring and Wheezing. Breast Present- Breast Pain. Not Present- Breast Mass, Nipple Discharge and Skin Changes. Cardiovascular Present- Rapid Heart Rate and Shortness of Breath. Not Present- Chest Pain, Difficulty Breathing Lying Down, Leg Cramps, Palpitations and Swelling of Extremities. Gastrointestinal Present- Abdominal Pain, Change in Bowel Habits, Nausea and Vomiting. Not Present- Bloating, Bloody Stool, Chronic diarrhea, Constipation, Difficulty Swallowing, Excessive gas, Gets full quickly at meals, Hemorrhoids, Indigestion and Rectal Pain. Female Genitourinary Present- Urgency. Not Present- Frequency, Nocturia, Painful Urination and Pelvic Pain. Musculoskeletal Present- Back Pain. Not Present- Joint Pain, Joint Stiffness, Muscle Pain, Muscle Weakness and Swelling of Extremities. Neurological Present- Fainting, Headaches, Numbness, Tingling and Weakness. Not Present- Decreased Memory, Seizures, Tremor and Trouble walking. Psychiatric Not Present- Anxiety, Bipolar, Change in Sleep Pattern, Depression, Fearful and Frequent crying. Endocrine Present- Excessive Hunger and Heat Intolerance. Not Present- Cold Intolerance, Hair Changes, Hot flashes and New Diabetes. Hematology Present- Easy Bruising. Not Present- Excessive bleeding, Gland problems, HIV and Persistent Infections.   Vitals (Dahionnarah Maldonado RMA; 09/21/2014 2:56 PM) 09/21/2014 2:55 PM Weight: 211.2 lb Height: 65in Body Surface Area: 2.1 m Body Mass Index: 35.15 kg/m Temp.: 98.7F(Oral)  Pulse: 65 (Regular)  P.OX: 98% (Room air) BP: 108/60 (Sitting, Left Arm, Standard)    Physical Exam (Beckie Viscardi MD; 09/21/2014 3:43 PM) General Mental Status-Alert. General Appearance-Consistent with stated age. Hydration-Well hydrated. Voice-Normal.  Head and Neck Head-normocephalic, atraumatic with  no lesions or palpable masses.  Eye Eyeball - Bilateral-Extraocular movements intact. Sclera/Conjunctiva - Bilateral-No scleral icterus.  Chest and Lung Exam Chest and lung exam reveals -quiet, even and easy respiratory effort with no use of accessory muscles and on auscultation, normal breath sounds, no adventitious sounds and normal vocal resonance. Inspection Chest Wall - Normal. Back - normal.  Cardiovascular Cardiovascular examination reveals -on palpation PMI is normal   in location and amplitude, no palpable S3 or S4. Normal cardiac borders., normal heart sounds, regular rate and rhythm with no murmurs, carotid auscultation reveals no bruits and normal pedal pulses bilaterally.  Abdomen Inspection Inspection of the abdomen reveals - No Hernias. Skin - Scar - no surgical scars. Palpation/Percussion Palpation and Percussion of the abdomen reveal - Soft, Non Tender, No Rebound tenderness, No Rigidity (guarding) and No hepatosplenomegaly. Auscultation Auscultation of the abdomen reveals - Bowel sounds normal.  Neurologic Neurologic evaluation reveals -alert and oriented x 3 with no impairment of recent or remote memory. Mental Status-Normal.  Musculoskeletal Normal Exam - Left-Upper Extremity Strength Normal and Lower Extremity Strength Normal. Normal Exam - Right-Upper Extremity Strength Normal, Lower Extremity Weakness.    Assessment & Plan (Everley Evora MD; 09/21/2014 3:45 PM) CALCULUS OF GALLBLADDER WITH ACUTE CHOLECYSTITIS AND OBSTRUCTION (574.01  K80.01) Impression: 23-year-old female with symptomatic cholelithiasis  We'll proccystectomy operating room for a laparoscopic cholecystectomy  Risks and benefits were discussed with the patient to generally include, but not limited to: infection, bleeding, possible need for post op ERCP, damage to the bile ducts, bile leak, and possible need for further surgery. Alternatives were offered and described. All  questions were answered and the patient voiced understanding of the procedure and wishes to proceed at this point with a laparoscopic cholecystectomy Current Plans  Schedule for Surgery 

## 2014-09-22 NOTE — ED Notes (Addendum)
Gall bladder surgery today ~ 1300: d/c'd home; 30-45 minutes ago pt. Couldn't move legs and arms; now in triage states, I cannot feel back of throat.  No hx. Of allergies to anesthetics, paralytics.

## 2014-09-22 NOTE — ED Notes (Addendum)
Pt states that she cannot feel her legs. Pt able to move bilateral legs. Pt states that arms both feel heavy as well. States that "you could stab my fingers and i wouldn't feel it". Pt able to move fingers and hands. NAD noted. VVS

## 2014-09-22 NOTE — Anesthesia Preprocedure Evaluation (Addendum)
Anesthesia Evaluation  Patient identified by MRN, date of birth, ID band Patient awake    Reviewed: Allergy & Precautions, H&P , NPO status , Patient's Chart, lab work & pertinent test results  Airway Mallampati: II TM Distance: >3 FB Neck ROM: Full    Dental no notable dental hx. (+) Teeth Intact, Dental Advisory Given   Pulmonary asthma , former smoker,  breath sounds clear to auscultation  Pulmonary exam normal       Cardiovascular negative cardio ROS  Rhythm:Regular Rate:Normal     Neuro/Psych negative neurological ROS  negative psych ROS   GI/Hepatic Neg liver ROS, GERD-  ,  Endo/Other  negative endocrine ROS  Renal/GU negative Renal ROS  negative genitourinary   Musculoskeletal   Abdominal   Peds  Hematology negative hematology ROS (+)   Anesthesia Other Findings   Reproductive/Obstetrics negative OB ROS                          Anesthesia Physical Anesthesia Plan  ASA: II  Anesthesia Plan: General   Post-op Pain Management:    Induction: Intravenous  Airway Management Planned: Oral ETT  Additional Equipment:   Intra-op Plan:   Post-operative Plan: Extubation in OR  Informed Consent: I have reviewed the patients History and Physical, chart, labs and discussed the procedure including the risks, benefits and alternatives for the proposed anesthesia with the patient or authorized representative who has indicated his/her understanding and acceptance.   Dental advisory given  Plan Discussed with: CRNA  Anesthesia Plan Comments:         Anesthesia Quick Evaluation

## 2014-09-22 NOTE — Anesthesia Procedure Notes (Signed)
Procedure Name: Intubation Date/Time: 09/22/2014 7:26 AM Performed by: Charm BargesBUTLER, Ayme Short R Pre-anesthesia Checklist: Patient identified, Emergency Drugs available, Suction available, Patient being monitored and Timeout performed Patient Re-evaluated:Patient Re-evaluated prior to inductionOxygen Delivery Method: Circle system utilized Preoxygenation: Pre-oxygenation with 100% oxygen Intubation Type: IV induction Ventilation: Mask ventilation without difficulty Laryngoscope Size: Mac and 3 Grade View: Grade I Tube type: Oral Tube size: 7.5 mm Number of attempts: 1 Airway Equipment and Method: Stylet Placement Confirmation: ETT inserted through vocal cords under direct vision and positive ETCO2 Secured at: 21 cm Tube secured with: Tape Dental Injury: Teeth and Oropharynx as per pre-operative assessment

## 2014-09-22 NOTE — Progress Notes (Signed)
oob amb to /from bath with standby assitance/ approx 20+ feet / staes voided large amt, witnessed by NT / nausea remains but "is much better", can also acknowledge she hurts more with movement than when at rest & is tolerable per her when at rest,( sleeping past ).... She has experienced no gagging/retching for over an hour and displays much less emotion/drama in dealing with her pain.

## 2014-09-22 NOTE — ED Provider Notes (Signed)
I saw and evaluated the patient, reviewed the resident's note and I agree with the findings and plan.   EKG Interpretation   Date/Time:  Thursday September 22 2014 16:27:21 EDT Ventricular Rate:  64 PR Interval:  118 QRS Duration: 87 QT Interval:  422 QTC Calculation: 435 R Axis:   57 Text Interpretation:  Sinus rhythm Atrial premature complex Borderline  short PR interval No previous ECGs available Confirmed by Anthem Frazer  MD,  Emit Kuenzel [phone]) on 09/22/2014 7:11:21 PM      Results for orders placed during the hospital encounter of 09/22/14  CBC WITH DIFFERENTIAL      Result Value Ref Range   WBC 12.0 (*) 4.0 - 10.5 K/uL   RBC 4.35  3.87 - 5.11 MIL/uL   Hemoglobin 12.7  12.0 - 15.0 g/dL   HCT 60.4  54.0 - 98.1 %   MCV 86.0  78.0 - 100.0 fL   MCH 29.2  26.0 - 34.0 pg   MCHC 34.0  30.0 - 36.0 g/dL   RDW 19.1  47.8 - 29.5 %   Platelets 277  150 - 400 K/uL   Neutrophils Relative % 92 (*) 43 - 77 %   Neutro Abs 11.0 (*) 1.7 - 7.7 K/uL   Lymphocytes Relative 7 (*) 12 - 46 %   Lymphs Abs 0.9  0.7 - 4.0 K/uL   Monocytes Relative 1 (*) 3 - 12 %   Monocytes Absolute 0.2  0.1 - 1.0 K/uL   Eosinophils Relative 0  0 - 5 %   Eosinophils Absolute 0.0  0.0 - 0.7 K/uL   Basophils Relative 0  0 - 1 %   Basophils Absolute 0.0  0.0 - 0.1 K/uL  COMPREHENSIVE METABOLIC PANEL      Result Value Ref Range   Sodium 142  137 - 147 mEq/L   Potassium 3.8  3.7 - 5.3 mEq/L   Chloride 105  96 - 112 mEq/L   CO2 20  19 - 32 mEq/L   Glucose, Bld 117 (*) 70 - 99 mg/dL   BUN 4 (*) 6 - 23 mg/dL   Creatinine, Ser 6.21 (*) 0.50 - 1.10 mg/dL   Calcium 9.7  8.4 - 30.8 mg/dL   Total Protein 8.1  6.0 - 8.3 g/dL   Albumin 4.1  3.5 - 5.2 g/dL   AST 70 (*) 0 - 37 U/L   ALT 310 (*) 0 - 35 U/L   Alkaline Phosphatase 110  39 - 117 U/L   Total Bilirubin 0.4  0.3 - 1.2 mg/dL   GFR calc non Af Amer >90  >90 mL/min   GFR calc Af Amer >90  >90 mL/min   Anion gap 17 (*) 5 - 15  MAGNESIUM      Result Value Ref Range   Magnesium 1.7  1.5 - 2.5 mg/dL  CK      Result Value Ref Range   Total CK 95  7 - 177 U/L  ETHANOL      Result Value Ref Range   Alcohol, Ethyl (B) <11  0 - 11 mg/dL  URINE RAPID DRUG SCREEN (HOSP PERFORMED)      Result Value Ref Range   Opiates NONE DETECTED  NONE DETECTED   Cocaine NONE DETECTED  NONE DETECTED   Benzodiazepines POSITIVE (*) NONE DETECTED   Amphetamines NONE DETECTED  NONE DETECTED   Tetrahydrocannabinol POSITIVE (*) NONE DETECTED   Barbiturates NONE DETECTED  NONE DETECTED   No results found.  Patient  seen by me. Patient status post gallbladder removal today discharged home 30-45 minutes prior arrival. Patient came in with what seemed to be maybe a panic attack could move legs and arms such as numbness or tingling. Patient alludes to the fact that she may have been breathing fast. She has no history to any medicines. Evaluation here seen to suggest maybe a panic attack treated with a benzo diazepam's. Then after a closer observation became concerned that perhaps there was some psychosomatic disorder ongoing. Patient given some more benzo diazepam's. Patient slept for out upon awaking improve significantly. Patient can be discharged home patient had extensive lab workup without a lot of significant abnormalities. Suspect that either it was a psychosomatic reaction or a panic attack or perhaps was some sort of the reaction to medications given during anesthesia but either way patient is stable and can be discharged home.  Vanetta MuldersScott Iyad Deroo, MD 09/22/14 2042

## 2014-09-22 NOTE — Interval H&P Note (Signed)
History and Physical Interval Note:  09/22/2014 6:59 AM  Caitlin SlimNancy Gonser  has presented today for surgery, with the diagnosis of gallstones  The various methods of treatment have been discussed with the patient and family. After consideration of risks, benefits and other options for treatment, the patient has consented to  Procedure(s): LAPAROSCOPIC CHOLECYSTECTOMY (N/A) as a surgical intervention .  The patient's history has been reviewed, patient examined, no change in status, stable for surgery.  I have reviewed the patient's chart and labs.  Questions were answered to the patient's satisfaction.     Marigene Ehlersamirez Jr., Jed LimerickArmando

## 2014-09-22 NOTE — ED Notes (Signed)
MD at bedside. 

## 2014-09-22 NOTE — Op Note (Signed)
09/22/2014  8:23 AM  PATIENT:  Caitlin Sullivan  23 y.o. female  PRE-OPERATIVE DIAGNOSIS:  gallstones  POST-OPERATIVE DIAGNOSIS:  gallstones  PROCEDURE:  Procedure(s): LAPAROSCOPIC CHOLECYSTECTOMY (N/A)  SURGEON:  Surgeon(s) and Role:    * Axel FillerArmando Corbyn Steedman, MD - Primary  ASSISTANTS: none   ANESTHESIA:   local and general  EBL:  Total I/O In: -  Out: 50 [Blood:50]  BLOOD ADMINISTERED:none  DRAINS: none   LOCAL MEDICATIONS USED:  BUPIVICAINE   SPECIMEN:  Source of Specimen:  gallbladder  DISPOSITION OF SPECIMEN:  PATHOLOGY  COUNTS:  YES  TOURNIQUET:  * No tourniquets in log *  DICTATION: .Dragon Dictation  Findings: The patient had chronic inflammation of the GB and stones  Details of the procedure:  The patient was taken to the operating and placed in the supine position with bilateral SCDs in place.  The patient was prepped and draped in the usual sterile fashion. A time out was called and all facts were verified. A pneumoperitoneum was obtained via A Veress needle technique to a pressure of 14mm of mercury. A 5mm trochar was then placed in the right upper quadrant under visualization, and there were no injuries to any abdominal organs. A 11 mm port was then placed in the umbilical region after infiltrating with local anesthesia under direct visualization. A second and third epigastric port and right lower quadrant port placement under direct visualization, respectively. The gallbladder was identified and retracted, the peritoneum was then sharply dissected from the gallbladder and this dissection was carried down to Calot's triangle. The gallbladder was identified and stripped away circumferentially and seen going into the gallbladder 360, the critical angle was obtained. 2 clips were placed proximally one distally and the cystic duct transected. The cystic artery was identified and 2 clips placed proximally and one distally and transected.  We then proceeded to remove the  gallbladder off the hepatic fossa with Bovie cautery. A retrieval bag was then placed in the abdomen and gallbladder placed in the bag. The bag and specimen were then removed from the abdomen in its entirety.  The hepatic fossa was then reexamined and hemostasis was achieved with Bovie cautery and was excellent at the end of the case. The subhepatic fossa and perihepatic fossa was then irrigated until the effluent was clear. The 11 mm trocar fascia was reapproximated with the Endo Close #1 Vicryl x.  The pneumoperitoneum was evacuated and all trochars removed under direct visulalization.  The skin was then closed with 4-0 Monocryl and the skin dressed with Steri-Strips, gauze, and tape.  The patient was awaken from general anesthesia and taken to the recovery room in stable condition.   PLAN OF CARE: Discharge to home after PACU  PATIENT DISPOSITION:  PACU - hemodynamically stable.   Delay start of Pharmacological VTE agent (>24hrs) due to surgical blood loss or risk of bleeding: not applicable

## 2014-09-22 NOTE — Discharge Instructions (Signed)
[facility] ______[facility] °LAPAROSCOPIC SURGERY: POST OP INSTRUCTIONS °Always review your discharge instruction sheet given to you by the facility where your surgery was performed. °IF YOU HAVE DISABILITY OR FAMILY LEAVE FORMS, YOU MUST BRING THEM TO THE OFFICE FOR PROCESSING.   °DO NOT GIVE THEM TO YOUR DOCTOR. ° °1. A prescription for pain medication may be given to you upon discharge.  Take your pain medication as prescribed, if needed.  If narcotic pain medicine is not needed, then you may take acetaminophen (Tylenol) or ibuprofen (Advil) as needed. °2. Take your usually prescribed medications unless otherwise directed. °3. If you need a refill on your pain medication, please contact your pharmacy.  They will contact our office to request authorization. Prescriptions will not be filled after 5pm or on week-ends. °4. You should follow a light diet the first few days after arrival home, such as soup and crackers, etc.  Be sure to include lots of fluids daily. °5. Most patients will experience some swelling and bruising in the area of the incisions.  Ice packs will help.  Swelling and bruising can take several days to resolve.  °6. It is common to experience some constipation if taking pain medication after surgery.  Increasing fluid intake and taking a stool softener (such as Colace) will usually help or prevent this problem from occurring.  A mild laxative (Milk of Magnesia or Miralax) should be taken according to package instructions if there are no bowel movements after 48 hours. °7. Unless discharge instructions indicate otherwise, you may remove your bandages 24-48 hours after surgery, and you may shower at that time.  You may have steri-strips (small skin tapes) in place directly over the incision.  These strips should be left on the skin for 7-10 days.  If your surgeon used skin glue on the incision, you may shower in 24 hours.  The glue will flake off over the next 2-3 weeks.  Any sutures or  staples will be removed at the office during your follow-up visit. °8. ACTIVITIES:  You may resume regular (light) daily activities beginning the next day--such as daily self-care, walking, climbing stairs--gradually increasing activities as tolerated.  You may have sexual intercourse when it is comfortable.  Refrain from any heavy lifting or straining until approved by your doctor. °a. You may drive when you are no longer taking prescription pain medication, you can comfortably wear a seatbelt, and you can safely maneuver your car and apply brakes. °b. RETURN TO WORK:  __________________________________________________________ °9. You should see your doctor in the office for a follow-up appointment approximately 2-3 weeks after your surgery.  Make sure that you call for this appointment within a day or two after you arrive home to insure a convenient appointment time. °10. OTHER INSTRUCTIONS: __________________________________________________________________________________________________________________________ __________________________________________________________________________________________________________________________ °WHEN TO CALL YOUR DOCTOR: °1. Fever over 101.0 °2. Inability to urinate °3. Continued bleeding from incision. °4. Increased pain, redness, or drainage from the incision. °5. Increasing abdominal pain ° °The clinic staff is available to answer your questions during regular business hours.  Please don’t hesitate to call and ask to speak to one of the nurses for clinical concerns.  If you have a medical emergency, go to the nearest emergency room or call 911.  A surgeon from [facility] is always on call at the hospital. °[address], [address], [place], [place]  [address] ? [address] [address], [place], [place]   [address] °[phone] ? [phone] ? FAX [phone] °Web site:   [email or web address] ° °What to eat: ° °For your first meals, you should eat  lightly; only small meals initially.  If you do not have nausea, you may eat larger meals.  Avoid spicy, greasy and heavy food.   ° °General Anesthesia, Adult, Care After  °Refer to this sheet in the next few weeks. These instructions provide you with information on caring for yourself after your procedure. Your health care provider may also give you more specific instructions. Your treatment has been planned according to current medical practices, but problems sometimes occur. Call your health care provider if you have any problems or questions after your procedure.  °WHAT TO EXPECT AFTER THE PROCEDURE  °After the procedure, it is typical to experience:  °Sleepiness.  °Nausea and vomiting. °HOME CARE INSTRUCTIONS  °For the first 24 hours after general anesthesia:  °Have a responsible person with you.  °Do not drive a car. If you are alone, do not take public transportation.  °Do not drink alcohol.  °Do not take medicine that has not been prescribed by your health care provider.  °Do not sign important papers or make important decisions.  °You may resume a normal diet and activities as directed by your health care provider.  °Change bandages (dressings) as directed.  °If you have questions or problems that seem related to general anesthesia, call the hospital and ask for the anesthetist or anesthesiologist on call. °SEEK MEDICAL CARE IF:  °You have nausea and vomiting that continue the day after anesthesia.  °You develop a rash. °SEEK IMMEDIATE MEDICAL CARE IF:  °You have difficulty breathing.  °You have chest pain.  °You have any allergic problems. °Document Released: 03/17/2001 Document Revised: 08/11/2013 Document Reviewed: 06/24/2013  °[employer]® Patient Information ©2014 [employer].  ° ° °

## 2014-09-22 NOTE — ED Notes (Signed)
Pt resting with eyes closed at this time.  Pt's mother remains at bedside.

## 2014-09-22 NOTE — ED Provider Notes (Signed)
CSN: [id number]     Arrival date & time 09/22/14  1607 History   First MD Initiated Contact with Patient 09/22/14 1636     Chief Complaint  Patient presents with  . Weakness     (Consider location/radiation/quality/duration/timing/severity/associated sxs/prior Treatment) Patient is a 23 y.o. female presenting with general illness. The history is provided by the patient.  Illness Location:  Diffuse Quality:  Panic attack, "i cant move, im numb all over" Severity:  Mild Onset quality:  Sudden Timing:  Constant Progression:  Unchanged Chronicity:  New Context:  Pt had outpt lap chole today. States taht when she got home, she was hurting and she became anxious, and she became numb all over and states she cant move. Denies chest pain, SOB, fever, chills. Relieved by:  None tried Worsened by:  Stress Ineffective treatments:  None tried Associated symptoms: abdominal pain (s/p lap chole today)   Associated symptoms: no chest pain, no congestion, no cough, no diarrhea, no fatigue, no fever, no loss of consciousness, no nausea, no rash, no rhinorrhea, no shortness of breath, no vomiting and no wheezing     Past Medical History  Diagnosis Date  . Umbilical hernia   . Hyperemesis arising during pregnancy   . Asthma   . Gall stones   . Iron deficiency anemia   . Heart murmur   . GERD (gastroesophageal reflux disease)    Past Surgical History  Procedure Laterality Date  . Pilonidal cyst / sinus excision     Family History  Problem Relation Age of Onset  . Cancer Mother   . Hypertension Mother   . Diabetes Father   . Hypertension Father   . Hyperlipidemia Father   . Stroke Father   . Asthma Brother    History  Substance Use Topics  . Smoking status: Former Smoker -- 0.25 packs/day    Types: Cigarettes    Quit date: 09/16/2014  . Smokeless tobacco: Never Used  . Alcohol Use: No   OB History   Grav Para Term Preterm Abortions TAB SAB Ect Mult Living   2 1 1  1 1    1       Review of Systems  Constitutional: Negative for fever, activity change, appetite change and fatigue.  HENT: Negative for congestion and rhinorrhea.   Eyes: Negative for discharge, redness and itching.  Respiratory: Negative for cough, shortness of breath and wheezing.   Cardiovascular: Negative for chest pain.  Gastrointestinal: Positive for abdominal pain (s/p lap chole today). Negative for nausea, vomiting and diarrhea.  Genitourinary: Negative for dysuria and hematuria.  Musculoskeletal: Negative for back pain.  Skin: Negative for rash and wound.  Neurological: Negative for loss of consciousness and syncope.  Psychiatric/Behavioral: The patient is nervous/anxious.       Allergies  Review of patient's allergies indicates no known allergies.  Home Medications   Prior to Admission medications   Medication Sig Start Date End Date Taking? Authorizing Provider  albuterol (PROVENTIL HFA;VENTOLIN HFA) 108 (90 BASE) MCG/ACT inhaler Inhale 1-2 puffs into the lungs every 6 (six) hours as needed. For wheezing or shortness of breath 11/29/12  Yes Vanetta Mulders, MD  oxyCODONE-acetaminophen (ROXICET) 5-325 MG per tablet Take 1-2 tablets by mouth every 4 (four) hours as needed. 09/22/14   Axel Filler, MD   BP 108/59  Pulse 71  Temp(Src) 99.3 F (37.4 C) (Oral)  Resp 18  SpO2 97%  LMP 08/23/2014 Physical Exam  Constitutional: She is oriented to person, place, and time.  She appears well-developed and well-nourished. No distress.  HENT:  Head: Normocephalic and atraumatic.  Mouth/Throat: Oropharynx is clear and moist. No oropharyngeal exudate.  Eyes: Conjunctivae and EOM are normal. Pupils are equal, round, and reactive to light. Right eye exhibits no discharge. Left eye exhibits no discharge. No scleral icterus.  Neck: Normal range of motion. Neck supple.  Cardiovascular: Normal rate, regular rhythm and normal heart sounds.   No murmur heard. Pulmonary/Chest: Effort normal and  breath sounds normal. No respiratory distress. She has no wheezes. She has no rales.  Abdominal: Soft. She exhibits no distension and no mass. There is tenderness.  Mild ttp to abd diffuse, no peritonitis. Laparascopic ports fresh but non complicated  Neurological: She is alert and oriented to person, place, and time. She exhibits normal muscle tone. Coordination normal.  Skin: Skin is warm. No rash noted. She is not diaphoretic.  Psychiatric: Thought content normal. Her mood appears anxious. Her speech is rapid and/or pressured. Cognition and memory are normal. She expresses impulsivity.    ED Course  Procedures (including critical care time) Labs Review Labs Reviewed  CBC WITH DIFFERENTIAL - Abnormal; Notable for the following:    WBC 12.0 (*)    Neutrophils Relative % 92 (*)    Neutro Abs 11.0 (*)    Lymphocytes Relative 7 (*)    Monocytes Relative 1 (*)    All other components within normal limits  COMPREHENSIVE METABOLIC PANEL - Abnormal; Notable for the following:    Glucose, Bld 117 (*)    BUN 4 (*)    Creatinine, Ser 0.46 (*)    AST 70 (*)    ALT 310 (*)    Anion gap 17 (*)    All other components within normal limits  URINE RAPID DRUG SCREEN (HOSP PERFORMED) - Abnormal; Notable for the following:    Benzodiazepines POSITIVE (*)    Tetrahydrocannabinol POSITIVE (*)    All other components within normal limits  MAGNESIUM  CK  ETHANOL    Imaging Review No results found.   EKG Interpretation   Date/Time:  Thursday September 22 2014 16:27:21 EDT Ventricular Rate:  64 PR Interval:  118 QRS Duration: 87 QT Interval:  422 QTC Calculation: 435 R Axis:   57 Text Interpretation:  Sinus rhythm Atrial premature complex Borderline  short PR interval No previous ECGs available Confirmed by ZACKOWSKI  MD,  SCOTT ([id number]) on 09/22/2014 7:11:21 PM      MDM   MDM: 23 y.o. Female w/ cc: of anxious. Had lap chole today. Went home, now anxious. States "numb, cant feel  anything, cant move." Here AFVSS, very anxious. Ativan with sxs improving. Returned and additional ativan given. Also given Percocet for post op pain. While in ED, patient had waxing and waning anxiety, and at one point states she was hallucinating. We obtained lab work which shows mild leukocytosis and transaminitis c/w post op lap chole. Patient slept for a few hours and on wakening she felt better. She has no psych hx but acknowledges she gets panic attacks from time to time. She was able to ambulate around ED without difficulty. She feels better. I feel that this is likeyl related to anxiety vs complication of anesthesia. She was instructed to follow up w/ PCP as needed and return to ED as needed. Discharged.   Final diagnoses:  Complication of anesthesia, initial encounter   Discharge Medication List as of 09/22/2014  8:30 PM     [facility] EMERGENCY DEPARTMENT  [id number] [place] [id number] [phone]  As needed  Discharged  Pilar Jarvisoug Jaice Digioia, MD 09/22/14 2311

## 2014-09-22 NOTE — ED Notes (Signed)
Pt ambulated in room without any problems.  Vitals post ambulation B/P 117/77  P. 73  R. 11

## 2014-09-23 NOTE — Anesthesia Postprocedure Evaluation (Signed)
  Anesthesia Post-op Note  Patient: Caitlin SlimNancy Hotz  Procedure(s) Performed: Procedure(s): LAPAROSCOPIC CHOLECYSTECTOMY (N/A)  Patient Location: PACU  Anesthesia Type:General  Level of Consciousness: awake and alert   Airway and Oxygen Therapy: Patient Spontanous Breathing  Post-op Pain: moderate  Post-op Assessment: Post-op Vital signs reviewed, Patient's Cardiovascular Status Stable and Respiratory Function Stable  Post-op Vital Signs: Reviewed  Filed Vitals:   09/22/14 1120  BP: 133/60  Pulse: 64  Temp:   Resp: 15    Complications: No apparent anesthesia complications

## 2014-09-26 ENCOUNTER — Encounter (HOSPITAL_COMMUNITY): Payer: Self-pay | Admitting: Emergency Medicine

## 2014-09-26 ENCOUNTER — Emergency Department (HOSPITAL_COMMUNITY)
Admission: EM | Admit: 2014-09-26 | Discharge: 2014-09-26 | Disposition: A | Discharge status: Home / Self Care | Payer: Medicaid Other | Attending: Emergency Medicine | Admitting: Emergency Medicine

## 2014-09-26 DIAGNOSIS — O99019 Anemia complicating pregnancy, unspecified trimester: Secondary | ICD-10-CM | POA: Insufficient documentation

## 2014-09-26 DIAGNOSIS — R011 Cardiac murmur, unspecified: Secondary | ICD-10-CM | POA: Insufficient documentation

## 2014-09-26 DIAGNOSIS — O210 Mild hyperemesis gravidarum: Secondary | ICD-10-CM | POA: Diagnosis not present

## 2014-09-26 DIAGNOSIS — Z8719 Personal history of other diseases of the digestive system: Secondary | ICD-10-CM | POA: Insufficient documentation

## 2014-09-26 DIAGNOSIS — Z79899 Other long term (current) drug therapy: Secondary | ICD-10-CM | POA: Diagnosis not present

## 2014-09-26 DIAGNOSIS — D509 Iron deficiency anemia, unspecified: Secondary | ICD-10-CM | POA: Insufficient documentation

## 2014-09-26 DIAGNOSIS — J45909 Unspecified asthma, uncomplicated: Secondary | ICD-10-CM | POA: Insufficient documentation

## 2014-09-26 DIAGNOSIS — O99519 Diseases of the respiratory system complicating pregnancy, unspecified trimester: Secondary | ICD-10-CM | POA: Diagnosis not present

## 2014-09-26 DIAGNOSIS — O219 Vomiting of pregnancy, unspecified: Secondary | ICD-10-CM

## 2014-09-26 DIAGNOSIS — Z87891 Personal history of nicotine dependence: Secondary | ICD-10-CM | POA: Insufficient documentation

## 2014-09-26 DIAGNOSIS — Z3A00 Weeks of gestation of pregnancy not specified: Secondary | ICD-10-CM | POA: Insufficient documentation

## 2014-09-26 LAB — CBC WITH DIFFERENTIAL/PLATELET
BASOS PCT: 0 % (ref 0–1)
Basophils Absolute: 0 10*3/uL (ref 0.0–0.1)
EOS ABS: 0.1 10*3/uL (ref 0.0–0.7)
EOS PCT: 1 % (ref 0–5)
HEMATOCRIT: 36.6 % (ref 36.0–46.0)
Hemoglobin: 12.5 g/dL (ref 12.0–15.0)
LYMPHS PCT: 23 % (ref 12–46)
Lymphs Abs: 2 10*3/uL (ref 0.7–4.0)
MCH: 29.3 pg (ref 26.0–34.0)
MCHC: 34.2 g/dL (ref 30.0–36.0)
MCV: 85.9 fL (ref 78.0–100.0)
MONO ABS: 0.5 10*3/uL (ref 0.1–1.0)
Monocytes Relative: 5 % (ref 3–12)
Neutro Abs: 6 10*3/uL (ref 1.7–7.7)
Neutrophils Relative %: 71 % (ref 43–77)
PLATELETS: 291 10*3/uL (ref 150–400)
RBC: 4.26 MIL/uL (ref 3.87–5.11)
RDW: 13.5 % (ref 11.5–15.5)
WBC: 8.5 10*3/uL (ref 4.0–10.5)

## 2014-09-26 LAB — LIPASE, BLOOD: LIPASE: 13 U/L (ref 11–59)

## 2014-09-26 LAB — URINALYSIS, ROUTINE W REFLEX MICROSCOPIC
Bilirubin Urine: NEGATIVE
Glucose, UA: NEGATIVE mg/dL
Hgb urine dipstick: NEGATIVE
KETONES UR: NEGATIVE mg/dL
NITRITE: NEGATIVE
PH: 8 (ref 5.0–8.0)
PROTEIN: NEGATIVE mg/dL
Specific Gravity, Urine: 1.012 (ref 1.005–1.030)
Urobilinogen, UA: 0.2 mg/dL (ref 0.0–1.0)

## 2014-09-26 LAB — COMPREHENSIVE METABOLIC PANEL
ALT: 98 U/L — ABNORMAL HIGH (ref 0–35)
AST: 19 U/L (ref 0–37)
Albumin: 4.1 g/dL (ref 3.5–5.2)
Alkaline Phosphatase: 84 U/L (ref 39–117)
Anion gap: 15 (ref 5–15)
BUN: 8 mg/dL (ref 6–23)
CALCIUM: 9.6 mg/dL (ref 8.4–10.5)
CO2: 22 meq/L (ref 19–32)
CREATININE: 0.5 mg/dL (ref 0.50–1.10)
Chloride: 101 mEq/L (ref 96–112)
GFR calc Af Amer: >90 mL/min (ref >90)
Glucose, Bld: 100 mg/dL — ABNORMAL HIGH (ref 70–99)
Potassium: 3.9 mEq/L (ref 3.7–5.3)
SODIUM: 138 meq/L (ref 137–147)
Total Bilirubin: 0.5 mg/dL (ref 0.3–1.2)
Total Protein: 7.8 g/dL (ref 6.0–8.3)

## 2014-09-26 LAB — URINE MICROSCOPIC-ADD ON

## 2014-09-26 LAB — PREGNANCY, URINE: PREG TEST UR: POSITIVE — AB

## 2014-09-26 MED ORDER — ONDANSETRON 4 MG PO TBDP: ORAL_TABLET | ORAL | Status: DC

## 2014-09-26 MED ORDER — PROMETHAZINE HCL 25 MG PO TABS: 25.0000 mg | ORAL_TABLET | Freq: Four times a day (QID) | ORAL | Status: DC | PRN

## 2014-09-26 MED ORDER — ONDANSETRON 8 MG PO TBDP
8.0000 mg | ORAL_TABLET | Freq: Once | ORAL | Status: AC
  Administered 2014-09-26: 8 mg via ORAL
  Filled 2014-09-26: qty 1

## 2014-09-26 MED ORDER — PRENATAL COMPLETE 14-0.4 MG PO TABS: 14.0000 mg | ORAL_TABLET | Freq: Every day | ORAL | Status: DC

## 2014-09-26 NOTE — ED Notes (Addendum)
Pt had gall bladder removed on Thursday, took hydrocodone last night and has taken it before but since then she has been having n/v. Pt also c/o weakness. Pt is also 3 days late with menstrual and has had unprotected sex.

## 2014-09-26 NOTE — ED Provider Notes (Signed)
Medical screening examination/treatment/procedure(s) were performed by non-physician practitioner and as supervising physician I was immediately available for consultation/collaboration.   EKG Interpretation None       Nirav Sweda R. Shirl Weir, MD 09/26/14 1538 

## 2014-09-26 NOTE — Discharge Instructions (Signed)
Take Phenergan and Zofran as directed as needed for nausea. Take prenatal pills daily. Followup with your OB/GYN.  Hyperemesis Gravidarum Hyperemesis gravidarum is a severe form of nausea and vomiting that happens during pregnancy. Hyperemesis is worse than morning sickness. It may cause you to have nausea or vomiting all day for many days. It may keep you from eating and drinking enough food and liquids. Hyperemesis usually occurs during the first half (the first 20 weeks) of pregnancy. It often goes away once a woman is in her second half of pregnancy. However, sometimes hyperemesis continues through an entire pregnancy.  CAUSES  The cause of this condition is not completely known but is thought to be related to changes in the body's hormones when pregnant. It could be from the high level of the pregnancy hormone or an increase in estrogen in the body.  SIGNS AND SYMPTOMS   Severe nausea and vomiting.  Nausea that does not go away.  Vomiting that does not allow you to keep any food down.  Weight loss and body fluid loss (dehydration).  Having no desire to eat or not liking food you have previously enjoyed. DIAGNOSIS  Your health care provider will do a physical exam and ask you about your symptoms. He or she may also order blood tests and urine tests to make sure something else is not causing the problem.  TREATMENT  You may only need medicine to control the problem. If medicines do not control the nausea and vomiting, you will be treated in the hospital to prevent dehydration, increased acid in the blood (acidosis), weight loss, and changes in the electrolytes in your body that may harm the unborn baby (fetus). You may need IV fluids.  HOME CARE INSTRUCTIONS   Only take over-the-counter or prescription medicines as directed by your health care provider.  Try eating a couple of dry crackers or toast in the morning before getting out of bed.  Avoid foods and smells that upset your  stomach.  Avoid fatty and spicy foods.  Eat 5-6 small meals a day.  Do not drink when eating meals. Drink between meals.  For snacks, eat high-protein foods, such as cheese.  Eat or suck on things that have ginger in them. Ginger helps nausea.  Avoid food preparation. The smell of food can spoil your appetite.  Avoid iron pills and iron in your multivitamins until after 3-4 months of being pregnant. However, consult with your health care provider before stopping any prescribed iron pills. SEEK MEDICAL CARE IF:   Your abdominal pain increases.  You have a severe headache.  You have vision problems.  You are losing weight. SEEK IMMEDIATE MEDICAL CARE IF:   You are unable to keep fluids down.  You vomit blood.  You have constant nausea and vomiting.  You have excessive weakness.  You have extreme thirst.  You have dizziness or fainting.  You have a fever or persistent symptoms for more than 2-3 days.  You have a fever and your symptoms suddenly get worse. MAKE SURE YOU:   Understand these instructions.  Will watch your condition.  Will get help right away if you are not doing well or get worse. Document Released: 12/09/2005 Document Revised: 09/29/2013 Document Reviewed: 07/21/2013 [facility] Patient Information 2015 [place]. This information is not intended to replace advice given to you by your health care provider. Make sure you discuss any questions you have with your health care provider.

## 2014-09-26 NOTE — ED Provider Notes (Signed)
CSN: [id number]     Arrival date & time 09/26/14  1045 History   First MD Initiated Contact with Patient 09/26/14 1205     Chief Complaint  Patient presents with  . Emesis  . Weakness     (Consider location/radiation/quality/duration/timing/severity/associated sxs/prior Treatment) HPI Comments: This is a 23 year old female with a past medical history of asthma, gallstones (cholecystectomy 09/22/14), IDA and GERD who presents to the emergency department complaining of nausea and vomiting beginning yesterday evening. Patient reports she took a hydrocodone for her post surgery pain, however this is the first time she took hydrocodone since her surgery, she immediately vomited, vomited multiple times yesterday, and 4 times this morning, nonbloody emesis. She states she is feeling generally weak. States she has soreness to her abdomen, however believe this is from vomiting and has not had significant abdominal pain. Denies fever or, chills or diarrhea. She also reports she is 3 days late on her menstrual cycle, last menstrual period began September 1. She has had unprotected sex and is not on birth control. Denies vaginal discharge or bleeding.  Patient is a 23 y.o. female presenting with vomiting and weakness. The history is provided by the patient.  Emesis Weakness Associated symptoms include nausea, vomiting and weakness.    Past Medical History  Diagnosis Date  . Umbilical hernia   . Hyperemesis arising during pregnancy   . Asthma   . Gall stones   . Iron deficiency anemia   . Heart murmur   . GERD (gastroesophageal reflux disease)    Past Surgical History  Procedure Laterality Date  . Pilonidal cyst / sinus excision    . Cholecystectomy    . Cholecystectomy N/A 09/22/2014    Procedure: LAPAROSCOPIC CHOLECYSTECTOMY;  Surgeon: Axel Filler, MD;  Location: [facility] OR;  Service: General;  Laterality: N/A;   Family History  Problem Relation Age of Onset  . Cancer Mother   .  Hypertension Mother   . Diabetes Father   . Hypertension Father   . Hyperlipidemia Father   . Stroke Father   . Asthma Brother    History  Substance Use Topics  . Smoking status: Former Smoker -- 0.25 packs/day    Types: Cigarettes    Quit date: 09/16/2014  . Smokeless tobacco: Never Used  . Alcohol Use: No   OB History   Grav Para Term Preterm Abortions TAB SAB Ect Mult Living   2 1 1  1 1    1      Review of Systems  Gastrointestinal: Positive for nausea and vomiting.  Genitourinary: Positive for menstrual problem.  Neurological: Positive for weakness.  All other systems reviewed and are negative.     Allergies  Review of patient's allergies indicates no known allergies.  Home Medications   Prior to Admission medications   Medication Sig Start Date End Date Taking? Authorizing Provider  albuterol (PROVENTIL HFA;VENTOLIN HFA) 108 (90 BASE) MCG/ACT inhaler Inhale 1-2 puffs into the lungs every 6 (six) hours as needed. For wheezing or shortness of breath 11/29/12  Yes Vanetta Mulders, MD  ibuprofen (ADVIL,MOTRIN) 200 MG tablet Take 400 mg by mouth every 6 (six) hours as needed for moderate pain.   Yes Historical Provider, MD  oxyCODONE-acetaminophen (ROXICET) 5-325 MG per tablet Take 1-2 tablets by mouth every 4 (four) hours as needed. 09/22/14  Yes Axel Filler, MD  ondansetron (ZOFRAN ODT) 4 MG disintegrating tablet 4mg  ODT q4 hours prn nausea/vomit 09/26/14   Trevor Mace, PA-C  Prenatal Vit-Fe Fumarate-FA (  PRENATAL COMPLETE) 14-0.4 MG TABS Take 14 mg by mouth daily. 09/26/14   Trevor Maceobyn M Albert, PA-C  promethazine (PHENERGAN) 25 MG tablet Take 1 tablet (25 mg total) by mouth every 6 (six) hours as needed for nausea or vomiting. 09/26/14   Trevor Maceobyn M Albert, PA-C   BP 122/75  Pulse 78  Temp(Src) 98.5 F (36.9 C) (Oral)  Resp 20  SpO2 98%  LMP 08/23/2014 Physical Exam  Nursing note and vitals reviewed. Constitutional: She is oriented to person, place, and time. She  appears well-developed and well-nourished. No distress.  HENT:  Head: Normocephalic and atraumatic.  Mouth/Throat: Oropharynx is clear and moist.  Eyes: Conjunctivae are normal.  Neck: Normal range of motion. Neck supple.  Cardiovascular: Normal rate, regular rhythm and normal heart sounds.   Pulmonary/Chest: Effort normal and breath sounds normal.  Abdominal: Soft. Normal appearance and bowel sounds are normal. She exhibits no distension. There is no tenderness.    Well healing abdominal incisions without signs of infection in the areas marked on the diagram.  Musculoskeletal: Normal range of motion. She exhibits no edema.  Neurological: She is alert and oriented to person, place, and time.  Skin: Skin is warm and dry. She is not diaphoretic.  Psychiatric: She has a normal mood and affect. Her behavior is normal.    ED Course  Procedures (including critical care time) Labs Review Labs Reviewed  COMPREHENSIVE METABOLIC PANEL - Abnormal; Notable for the following:    Glucose, Bld 100 (*)    ALT 98 (*)    All other components within normal limits  URINALYSIS, ROUTINE W REFLEX MICROSCOPIC - Abnormal; Notable for the following:    Leukocytes, UA TRACE (*)    All other components within normal limits  PREGNANCY, URINE - Abnormal; Notable for the following:    Preg Test, Ur POSITIVE (*)    All other components within normal limits  CBC WITH DIFFERENTIAL  LIPASE, BLOOD  URINE MICROSCOPIC-ADD ON    Imaging Review No results found.   EKG Interpretation None      MDM   Final diagnoses:  Vomiting pregnancy   Patient nontoxic appearing and in no apparent distress. Afebrile, vital signs stable. Abdomen is soft and nontender. Labs without any acute finding. Pregnancy test positive, after telling patient about this result, she started crying, stating "I knew it, I just need it confirmed". After receiving ODT Zofran, she was able to tolerate fluids without vomiting. Stable for d/c.  Will d/c with anti-emetics, prenatal vitamins. She has an OB/GYN who she will followup with. Return precautions given. Patient states understanding of treatment care plan and is agreeable.   Trevor MaceRobyn M Albert, PA-C 09/26/14 1335

## 2014-10-23 ENCOUNTER — Emergency Department (HOSPITAL_COMMUNITY): Payer: Medicaid Other

## 2014-10-23 ENCOUNTER — Encounter (HOSPITAL_COMMUNITY): Payer: Self-pay | Admitting: Emergency Medicine

## 2014-10-23 ENCOUNTER — Emergency Department (HOSPITAL_COMMUNITY)
Admission: EM | Admit: 2014-10-23 | Discharge: 2014-10-23 | Disposition: A | Discharge status: Home / Self Care | Payer: Medicaid Other | Attending: Emergency Medicine | Admitting: Emergency Medicine

## 2014-10-23 DIAGNOSIS — Z8719 Personal history of other diseases of the digestive system: Secondary | ICD-10-CM | POA: Insufficient documentation

## 2014-10-23 DIAGNOSIS — Y9289 Other specified places as the place of occurrence of the external cause: Secondary | ICD-10-CM | POA: Insufficient documentation

## 2014-10-23 DIAGNOSIS — Z79899 Other long term (current) drug therapy: Secondary | ICD-10-CM | POA: Diagnosis not present

## 2014-10-23 DIAGNOSIS — D509 Iron deficiency anemia, unspecified: Secondary | ICD-10-CM | POA: Insufficient documentation

## 2014-10-23 DIAGNOSIS — X58XXXA Exposure to other specified factors, initial encounter: Secondary | ICD-10-CM | POA: Diagnosis not present

## 2014-10-23 DIAGNOSIS — R011 Cardiac murmur, unspecified: Secondary | ICD-10-CM | POA: Diagnosis not present

## 2014-10-23 DIAGNOSIS — J45909 Unspecified asthma, uncomplicated: Secondary | ICD-10-CM | POA: Diagnosis not present

## 2014-10-23 DIAGNOSIS — Y9389 Activity, other specified: Secondary | ICD-10-CM | POA: Diagnosis not present

## 2014-10-23 DIAGNOSIS — Z87891 Personal history of nicotine dependence: Secondary | ICD-10-CM | POA: Diagnosis not present

## 2014-10-23 DIAGNOSIS — S93401A Sprain of unspecified ligament of right ankle, initial encounter: Secondary | ICD-10-CM | POA: Diagnosis not present

## 2014-10-23 DIAGNOSIS — S99911A Unspecified injury of right ankle, initial encounter: Secondary | ICD-10-CM | POA: Diagnosis present

## 2014-10-23 MED ORDER — ACETAMINOPHEN 325 MG PO TABS: 650.0000 mg | ORAL_TABLET | Freq: Four times a day (QID) | ORAL | Status: DC | PRN

## 2014-10-23 MED ORDER — ACETAMINOPHEN 325 MG PO TABS
650.0000 mg | ORAL_TABLET | Freq: Once | ORAL | Status: AC
  Administered 2014-10-23: 650 mg via ORAL
  Filled 2014-10-23: qty 2

## 2014-10-23 NOTE — ED Provider Notes (Signed)
CSN: [id number]     Arrival date & time 10/23/14  [id number] History   First MD Initiated Contact with Patient 10/23/14 1006     Chief Complaint  Patient presents with  . Ankle Pain     (Consider location/radiation/quality/duration/timing/severity/associated sxs/prior Treatment) HPI Comments: Patient is a 23 year old female percent into the emergency department for right ankle pain without radiation. She states she tripped and twisted her ankle 2 days ago, states she missed a step. Denies hitting her head or any loss of consciousness. She has not tried taking anything for her pain. She states ambulating and palpation worsen her pain. Denies any numbness.states his a history of right foot injury in past. Patient is currently pregnant.   Patient is a 23 y.o. female presenting with ankle pain.  Ankle Pain   Past Medical History  Diagnosis Date  . Umbilical hernia   . Hyperemesis arising during pregnancy   . Asthma   . Gall stones   . Iron deficiency anemia   . Heart murmur   . GERD (gastroesophageal reflux disease)    Past Surgical History  Procedure Laterality Date  . Pilonidal cyst / sinus excision    . Cholecystectomy    . Cholecystectomy N/A 09/22/2014    Procedure: LAPAROSCOPIC CHOLECYSTECTOMY;  Surgeon: Axel FillerArmando Ramirez, MD;  [facility];  Service: General;  Laterality: N/A;   Family History  Problem Relation Age of Onset  . Cancer Mother   . Hypertension Mother   . Diabetes Father   . Hypertension Father   . Hyperlipidemia Father   . Stroke Father   . Asthma Brother    History  Substance Use Topics  . Smoking status: Former Smoker -- 0.25 packs/day    Types: Cigarettes    Quit date: 09/16/2014  . Smokeless tobacco: Never Used  . Alcohol Use: No   OB History    Gravida Para Term Preterm AB TAB SAB Ectopic Multiple Living   2 1 1  1 1    1      Review of Systems  Musculoskeletal: Positive for myalgias, joint swelling and arthralgias.  All other systems reviewed  and are negative.     Allergies  Review of patient's allergies indicates no known allergies.  Home Medications   Prior to Admission medications   Medication Sig Start Date End Date Taking? Authorizing Provider  acetaminophen (TYLENOL) 325 MG tablet Take 2 tablets (650 mg total) by mouth every 6 (six) hours as needed. 10/23/14   Laxmi Choung L Lea Walbert, PA-C  albuterol (PROVENTIL HFA;VENTOLIN HFA) 108 (90 BASE) MCG/ACT inhaler Inhale 1-2 puffs into the lungs every 6 (six) hours as needed. For wheezing or shortness of breath 11/29/12   Vanetta MuldersScott Zackowski, MD  ibuprofen (ADVIL,MOTRIN) 200 MG tablet Take 400 mg by mouth every 6 (six) hours as needed for moderate pain.    Historical Provider, MD  ondansetron (ZOFRAN ODT) 4 MG disintegrating tablet 4mg  ODT q4 hours prn nausea/vomit 09/26/14   Robyn M Hess, PA-C  oxyCODONE-acetaminophen (ROXICET) 5-325 MG per tablet Take 1-2 tablets by mouth every 4 (four) hours as needed. 09/22/14   Axel FillerArmando Ramirez, MD  Prenatal Vit-Fe Fumarate-FA (PRENATAL COMPLETE) 14-0.4 MG TABS Take 14 mg by mouth daily. 09/26/14   Kathrynn Speedobyn M Hess, PA-C  promethazine (PHENERGAN) 25 MG tablet Take 1 tablet (25 mg total) by mouth every 6 (six) hours as needed for nausea or vomiting. 09/26/14   Robyn M Hess, PA-C   BP 111/71 mmHg  Pulse 60  Temp(Src) 98.2  F (36.8 C) (Oral)  Resp 18  SpO2 100%  LMP 10/01/2014 Physical Exam  Constitutional: She is oriented to person, place, and time. She appears well-developed and well-nourished. No distress.  HENT:  Head: Normocephalic and atraumatic.  Right Ear: External ear normal.  Left Ear: External ear normal.  Nose: Nose normal.  Mouth/Throat: Oropharynx is clear and moist.  Eyes: Conjunctivae are normal.  Neck: Normal range of motion. Neck supple.  Cardiovascular: Normal rate, regular rhythm, normal heart sounds and intact distal pulses.   Pulmonary/Chest: Effort normal and breath sounds normal.  Abdominal: Soft.  Musculoskeletal:        Right ankle: She exhibits decreased range of motion and swelling. She exhibits no ecchymosis, no deformity, no laceration and normal pulse. Tenderness. Lateral malleolus tenderness found.       Left ankle: Normal.       Right lower leg: Normal.       Left lower leg: Normal.       Right foot: Normal.       Left foot: Normal.  Neurological: She is alert and oriented to person, place, and time.  Skin: Skin is warm and dry. She is not diaphoretic.  Psychiatric: She has a normal mood and affect.  Nursing note and vitals reviewed.   ED Course  Procedures (including critical care time) Labs Review Labs Reviewed - No data to display  Imaging Review Dg Ankle Complete Right  10/23/2014   CLINICAL DATA:  Tripped on steps. Ankle injury and pain. Initial encounter.  EXAM: RIGHT ANKLE - COMPLETE 3+ VIEW  COMPARISON:  None.  FINDINGS: There is no evidence of fracture, dislocation, or joint effusion. There is no evidence of arthropathy or other focal bone abnormality. Soft tissues are unremarkable.  IMPRESSION: Negative.   Electronically Signed   By: Myles RosenthalJohn  Stahl M.D.   On: 10/23/2014 10:33   Dg Foot Complete Right  10/23/2014   CLINICAL DATA:  Tripped on stairs. Right foot pain. Initial encounter.  EXAM: RIGHT FOOT COMPLETE - 3+ VIEW  COMPARISON:  11/01/2005  FINDINGS: There is no evidence of acute fracture or dislocation. There is no evidence of arthropathy. Old fracture deformity of the distal fifth metatarsal noted. No Other focal bone abnormality. Soft tissues are unremarkable.  IMPRESSION: No acute findings.   Electronically Signed   By: Myles RosenthalJohn  Stahl M.D.   On: 10/23/2014 10:32     EKG Interpretation None     Patient declines crutches. MDM   Final diagnoses:  Right ankle sprain, initial encounter    Filed Vitals:   10/23/14 0954  BP: 111/71  Pulse: 60  Temp: 98.2 F (36.8 C)  Resp: 18   Afebrile, NAD, non-toxic appearing, AAOx4.  Neurovascularly intact. Normal sensation. No evidence  of compartment syndrome. Patient X-Ray negative for obvious fracture or dislocation. Pain managed in ED. Pt advised to follow up with her PCP if symptoms persist for possibility of missed fracture diagnosis. Patient given ace wrap while in ED, conservative therapy recommended and discussed. Patient will be dc home & is agreeable with above plan. Patient is stable at time of discharge      Jeannetta EllisJennifer L Shantal Roan, PA-C 10/23/14 1115

## 2014-10-23 NOTE — Discharge Instructions (Signed)
Please follow up with your primary care physician in 1-2 days. If you do not have one please call the [facility] number listed above. Please take Tylenol as prescribed. Please follow RICE method below. Please read all discharge instructions and return precautions.    Ankle Sprain An ankle sprain is an injury to the strong, fibrous tissues (ligaments) that hold the bones of your ankle joint together.  CAUSES An ankle sprain is usually caused by a fall or by twisting your ankle. Ankle sprains most commonly occur when you step on the outer edge of your foot, and your ankle turns inward. People who participate in sports are more prone to these types of injuries.  SYMPTOMS   Pain in your ankle. The pain may be present at rest or only when you are trying to stand or walk.  Swelling.  Bruising. Bruising may develop immediately or within 1 to 2 days after your injury.  Difficulty standing or walking, particularly when turning corners or changing directions. DIAGNOSIS  Your caregiver will ask you details about your injury and perform a physical exam of your ankle to determine if you have an ankle sprain. During the physical exam, your caregiver will press on and apply pressure to specific areas of your foot and ankle. Your caregiver will try to move your ankle in certain ways. An X-ray exam may be done to be sure a bone was not broken or a ligament did not separate from one of the bones in your ankle (avulsion fracture).  TREATMENT  Certain types of braces can help stabilize your ankle. Your caregiver can make a recommendation for this. Your caregiver may recommend the use of medicine for pain. If your sprain is severe, your caregiver may refer you to a surgeon who helps to restore function to parts of your skeletal system (orthopedist) or a physical therapist. HOME CARE INSTRUCTIONS   Apply ice to your injury for 1-2 days or as directed by your caregiver. Applying ice helps to  reduce inflammation and pain.  Put ice in a plastic bag.  Place a towel between your skin and the bag.  Leave the ice on for 15-20 minutes at a time, every 2 hours while you are awake.  Only take over-the-counter or prescription medicines for pain, discomfort, or fever as directed by your caregiver.  Elevate your injured ankle above the level of your heart as much as possible for 2-3 days.  If your caregiver recommends crutches, use them as instructed. Gradually put weight on the affected ankle. Continue to use crutches or a cane until you can walk without feeling pain in your ankle.  If you have a plaster splint, wear the splint as directed by your caregiver. Do not rest it on anything harder than a pillow for the first 24 hours. Do not put weight on it. Do not get it wet. You may take it off to take a shower or bath.  You may have been given an elastic bandage to wear around your ankle to provide support. If the elastic bandage is too tight (you have numbness or tingling in your foot or your foot becomes cold and blue), adjust the bandage to make it comfortable.  If you have an air splint, you may blow more air into it or let air out to make it more comfortable. You may take your splint off at night and before taking a shower or bath. Wiggle your toes in the splint several times per day  to decrease swelling. SEEK MEDICAL CARE IF:   You have rapidly increasing bruising or swelling.  Your toes feel extremely cold or you lose feeling in your foot.  Your pain is not relieved with medicine. SEEK IMMEDIATE MEDICAL CARE IF:  Your toes are numb or blue.  You have severe pain that is increasing. MAKE SURE YOU:   Understand these instructions.  Will watch your condition.  Will get help right away if you are not doing well or get worse. Document Released: 12/09/2005 Document Revised: 09/02/2012 Document Reviewed: 12/21/2011 [facility] Patient Information 2015 [place] [place]. This  information is not intended to replace advice given to you by your health care provider. Make sure you discuss any questions you have with your health care provider. RICE: Routine Care for Injuries The routine care of many injuries includes Rest, Ice, Compression, and Elevation (RICE). HOME CARE INSTRUCTIONS  Rest is needed to allow your body to heal. Routine activities can usually be resumed when comfortable. Injured tendons and bones can take up to 6 weeks to heal. Tendons are the cord-like structures that attach muscle to bone.  Ice following an injury helps keep the swelling down and reduces pain.  Put ice in a plastic bag.  Place a towel between your skin and the bag.  Leave the ice on for 15-20 minutes, 3-4 times a day, or as directed by your health care provider. Do this while awake, for the first 24 to 48 hours. After that, continue as directed by your caregiver.  Compression helps keep swelling down. It also gives support and helps with discomfort. If an elastic bandage has been applied, it should be removed and reapplied every 3 to 4 hours. It should not be applied tightly, but firmly enough to keep swelling down. Watch fingers or toes for swelling, bluish discoloration, coldness, numbness, or excessive pain. If any of these problems occur, remove the bandage and reapply loosely. Contact your caregiver if these problems continue.  Elevation helps reduce swelling and decreases pain. With extremities, such as the arms, hands, legs, and feet, the injured area should be placed near or above the level of the heart, if possible. SEEK IMMEDIATE MEDICAL CARE IF:  You have persistent pain and swelling.  You develop redness, numbness, or unexpected weakness.  Your symptoms are getting worse rather than improving after several days. These symptoms may indicate that further evaluation or further X-rays are needed. Sometimes, X-rays may not show a small broken bone (fracture) until 1 week or 10  days later. Make a follow-up appointment with your caregiver. Ask when your X-ray results will be ready. Make sure you get your X-ray results. Document Released: 03/23/2001 Document Revised: 12/14/2013 Document Reviewed: 05/10/2011 [facility] Patient Information 2015 [place] [place]. This information is not intended to replace advice given to you by your health care provider. Make sure you discuss any questions you have with your health care provider.

## 2014-10-23 NOTE — ED Notes (Signed)
Pt states she tripped 2 days ago and is now having rt ankle pain.

## 2014-10-24 ENCOUNTER — Encounter (HOSPITAL_COMMUNITY): Payer: Self-pay | Admitting: Emergency Medicine

## 2014-12-23 NOTE — L&D Delivery Note (Cosign Needed)
  Delivery Note At 9:32 AM a viable and healthy female was delivered via Vaginal, Spontaneous Delivery (Presentation: Left Occiput Anterior).  APGAR: 8, 9; weight 7 lb 6.9 oz (3370 g).   Placenta status: Intact, Spontaneous.  Cord: 3 vessels with the following complications: None.   Anesthesia: Epidural  Episiotomy: None Lacerations: None Est. Blood Loss (mL): 50  Mom to postpartum.  Baby to Couplet care / Skin to Skin.  Rochele PagesKARIM, WALIDAH N 12/21/2015, 9:58 AM

## 2015-04-30 ENCOUNTER — Encounter (HOSPITAL_COMMUNITY): Payer: Self-pay

## 2015-04-30 ENCOUNTER — Inpatient Hospital Stay (HOSPITAL_COMMUNITY)
Admission: AD | Admit: 2015-04-30 | Discharge: 2015-04-30 | Disposition: A | Discharge status: Home / Self Care | Payer: Medicaid Other | Source: Ambulatory Visit | Attending: Obstetrics and Gynecology | Admitting: Obstetrics and Gynecology

## 2015-04-30 ENCOUNTER — Inpatient Hospital Stay (HOSPITAL_COMMUNITY): Payer: Medicaid Other

## 2015-04-30 DIAGNOSIS — O26899 Other specified pregnancy related conditions, unspecified trimester: Secondary | ICD-10-CM

## 2015-04-30 DIAGNOSIS — Z87891 Personal history of nicotine dependence: Secondary | ICD-10-CM | POA: Diagnosis not present

## 2015-04-30 DIAGNOSIS — R1031 Right lower quadrant pain: Secondary | ICD-10-CM

## 2015-04-30 DIAGNOSIS — Z3A01 Less than 8 weeks gestation of pregnancy: Secondary | ICD-10-CM | POA: Diagnosis not present

## 2015-04-30 DIAGNOSIS — R1030 Lower abdominal pain, unspecified: Secondary | ICD-10-CM | POA: Insufficient documentation

## 2015-04-30 DIAGNOSIS — O210 Mild hyperemesis gravidarum: Secondary | ICD-10-CM | POA: Diagnosis not present

## 2015-04-30 DIAGNOSIS — O219 Vomiting of pregnancy, unspecified: Secondary | ICD-10-CM

## 2015-04-30 DIAGNOSIS — O9989 Other specified diseases and conditions complicating pregnancy, childbirth and the puerperium: Secondary | ICD-10-CM | POA: Diagnosis not present

## 2015-04-30 DIAGNOSIS — R109 Unspecified abdominal pain: Secondary | ICD-10-CM | POA: Diagnosis not present

## 2015-04-30 DIAGNOSIS — O3680X0 Pregnancy with inconclusive fetal viability, not applicable or unspecified: Secondary | ICD-10-CM

## 2015-04-30 LAB — URINALYSIS, ROUTINE W REFLEX MICROSCOPIC
BILIRUBIN URINE: NEGATIVE
GLUCOSE, UA: NEGATIVE mg/dL
HGB URINE DIPSTICK: NEGATIVE
Ketones, ur: NEGATIVE mg/dL
Leukocytes, UA: NEGATIVE
Nitrite: NEGATIVE
PROTEIN: NEGATIVE mg/dL
Specific Gravity, Urine: 1.02 (ref 1.005–1.030)
UROBILINOGEN UA: 0.2 mg/dL (ref 0.0–1.0)
pH: 7 (ref 5.0–8.0)

## 2015-04-30 LAB — CBC
HCT: 37.7 % (ref 36.0–46.0)
Hemoglobin: 13.2 g/dL (ref 12.0–15.0)
MCH: 30.3 pg (ref 26.0–34.0)
MCHC: 35 g/dL (ref 30.0–36.0)
MCV: 86.5 fL (ref 78.0–100.0)
PLATELETS: 245 10*3/uL (ref 150–400)
RBC: 4.36 MIL/uL (ref 3.87–5.11)
RDW: 13.4 % (ref 11.5–15.5)
WBC: 13.1 10*3/uL — AB (ref 4.0–10.5)

## 2015-04-30 LAB — WET PREP, GENITAL
Trich, Wet Prep: NONE SEEN
YEAST WET PREP: NONE SEEN

## 2015-04-30 LAB — POCT PREGNANCY, URINE: Preg Test, Ur: POSITIVE — AB

## 2015-04-30 LAB — HCG, QUANTITATIVE, PREGNANCY: hCG, Beta Chain, Quant, S: 19190 m[IU]/mL — ABNORMAL HIGH (ref <5)

## 2015-04-30 MED ORDER — PROMETHAZINE HCL 25 MG PO TABS: 25.0000 mg | ORAL_TABLET | Freq: Four times a day (QID) | ORAL | Status: DC | PRN

## 2015-04-30 MED ORDER — PROMETHAZINE HCL 25 MG PO TABS
25.0000 mg | ORAL_TABLET | Freq: Once | ORAL | Status: AC
  Administered 2015-04-30: 25 mg via ORAL
  Filled 2015-04-30: qty 1

## 2015-04-30 MED ORDER — SODIUM CHLORIDE 0.9 % IV SOLN
25.0000 mg | Freq: Once | INTRAVENOUS | Status: DC
  Filled 2015-04-30: qty 1

## 2015-04-30 NOTE — MAU Note (Signed)
Patient reports nausea and vomiting on and off for the past 4 days but since 4:00am it has been continuous. Reports that she has vomited about 10 times between 4:00 am and 11:30am. Patient reports feeling cramps similar to menstrual cramps but hurting a little more. Patient states she had a little spotting on 04/28/15 but no more bleeding since then.

## 2015-04-30 NOTE — MAU Provider Note (Signed)
History     CSN: [id number]  Arrival date and time: 04/30/15 1122   None     Chief Complaint  Patient presents with  . Morning Sickness  . Possible Pregnancy  . Abdominal Cramping   Emesis  This is a new problem. The current episode started in the past 7 days. The problem occurs 5 to 10 times per day. The problem has been gradually worsening. There has been no fever. Associated symptoms include abdominal pain. Pertinent negatives include no arthralgias, chest pain, chills, diarrhea, dizziness, fever or myalgias. She has tried nothing for the symptoms.  Abdominal Pain This is a new problem. The current episode started in the past 7 days. The onset quality is gradual. The problem occurs intermittently. The problem has been unchanged. The pain is located in the RLQ. The pain is moderate. The quality of the pain is cramping. Associated symptoms include vomiting. Pertinent negatives include no arthralgias, constipation, diarrhea, dysuria, fever, myalgias or nausea. Nothing aggravates the pain. The pain is relieved by nothing. She has tried nothing for the symptoms.   This is a 24 y.o. female at 8071w6d by LMP who presents with  C/o nausea, vomiting and abdominal pain. States has been vomiting for 4 days, worse today. Has lower abdominal pain, worse in RLQ. Had some spotting 2 days ago. Sees Dr Freda JacksonA Ross for GYN care, plans to stay there.   RN Note:  Expand All Collapse All   Patient reports nausea and vomiting on and off for the past 4 days but since 4:00am it has been continuous. Reports that she has vomited about 10 times between 4:00 am and 11:30am. Patient reports feeling cramps similar to menstrual cramps but hurting a little more. Patient states she had a little spotting on 04/28/15 but no more bleeding since then.           OB History    Gravida Para Term Preterm AB TAB SAB Ectopic Multiple Living   3 1 1  1 1    1       Past Medical History  Diagnosis Date  . Umbilical hernia   .  Hyperemesis arising during pregnancy   . Asthma   . Gall stones   . Iron deficiency anemia   . Heart murmur   . GERD (gastroesophageal reflux disease)     Past Surgical History  Procedure Laterality Date  . Pilonidal cyst / sinus excision    . Cholecystectomy    . Cholecystectomy N/A 09/22/2014    Procedure: LAPAROSCOPIC CHOLECYSTECTOMY;  Surgeon: Axel FillerArmando Ramirez, MD;  [facility];  Service: General;  Laterality: N/A;    Family History  Problem Relation Age of Onset  . Cancer Mother   . Hypertension Mother   . Diabetes Father   . Hypertension Father   . Hyperlipidemia Father   . Stroke Father   . Asthma Brother     History  Substance Use Topics  . Smoking status: Former Smoker -- 0.25 packs/day    Types: Cigarettes    Quit date: 09/16/2014  . Smokeless tobacco: Never Used  . Alcohol Use: No    Allergies: No Known Allergies  Prescriptions prior to admission  Medication Sig Dispense Refill Last Dose  . acetaminophen (TYLENOL) 325 MG tablet Take 2 tablets (650 mg total) by mouth every 6 (six) hours as needed. (Patient not taking: Reported on 04/30/2015) 30 tablet 0   . albuterol (PROVENTIL HFA;VENTOLIN HFA) 108 (90 BASE) MCG/ACT inhaler Inhale 1-2 puffs into the  lungs every 6 (six) hours as needed. For wheezing or shortness of breath   rescue  . ondansetron (ZOFRAN ODT) 4 MG disintegrating tablet 4mg  ODT q4 hours prn nausea/vomit (Patient not taking: Reported on 04/30/2015) 10 tablet 0   . oxyCODONE-acetaminophen (ROXICET) 5-325 MG per tablet Take 1-2 tablets by mouth every 4 (four) hours as needed. (Patient not taking: Reported on 04/30/2015) 30 tablet 0 09/25/2014 at Unknown time  . Prenatal Vit-Fe Fumarate-FA (PRENATAL COMPLETE) 14-0.4 MG TABS Take 14 mg by mouth daily. (Patient not taking: Reported on 04/30/2015) 60 each 0   . promethazine (PHENERGAN) 25 MG tablet Take 1 tablet (25 mg total) by mouth every 6 (six) hours as needed for nausea or vomiting. (Patient not taking:  Reported on 04/30/2015) 10 tablet 0     Review of Systems  Constitutional: Negative for fever and chills.  Cardiovascular: Negative for chest pain.  Gastrointestinal: Positive for vomiting and abdominal pain. Negative for nausea, diarrhea and constipation.  Genitourinary: Negative for dysuria.  Musculoskeletal: Negative for myalgias and arthralgias.  Neurological: Negative for dizziness.   Physical Exam   Blood pressure 120/63, pulse 82, temperature 98.3 F (36.8 C), temperature source Oral, resp. rate 18, last menstrual period 03/20/2015, not currently breastfeeding.  Physical Exam  Constitutional: She is oriented to person, place, and time. She appears well-developed and well-nourished. No distress.  HENT:  Head: Normocephalic.  Cardiovascular: Normal rate, regular rhythm and normal heart sounds.   Respiratory: Effort normal. No respiratory distress.  GI: Soft. She exhibits no distension and no mass. There is tenderness (Right lower quadrant with bimanual). There is no rebound and no guarding.  Genitourinary: Vagina normal. No vaginal discharge found.  Ext genitalia normal No bleeding seen Cervix long and closed Uterus small RLQ tender with palpation  Musculoskeletal: Normal range of motion.  Neurological: She is alert and oriented to person, place, and time.  Skin: Skin is warm and dry.  Psychiatric: She has a normal mood and affect.    MAU Course  Procedures  MDM Cultures done Will check Quant HCG and US  [place] Ob Comp Less 14 Wks  04/30/2015   CLINICAL DATA:  Pregnant patient with vaginal spotting hand pelvic cramps. Patient is 5 weeks and 6 days pregnant based on her last menstrual period.  EXAM: OBSTETRIC <14 WK US AND TRANSVAGINAL OB US  TECHNIQUE: Both transabdominal and transvaginal ultrasound examinations were performed for complete evaluation of the gestation as well as the maternal uterus, adnexal regions, and pelvic cul-de-sac. Transvaginal technique was performed  to assess early pregnancy.  COMPARISON:  None.  FINDINGS: Intrauterine gestational sac: Visualized/normal in shape.  Yolk sac:  Yes  Embryo:  Yes  Cardiac Activity: Yes  Heart Rate: 100  bpm  CRL:  2.5  mm   5 w   6 d                  US EDC: 12/25/2015  Maternal uterus/adnexae: No uterine masses. No subchronic hemorrhage. No endometrial fluid. Normal cervix. There is an echogenic focus with shadowing in the right ovary measuring 6 mm. This is consistent with a calcification. Ovaries otherwise unremarkable. No adnexal masses. No pelvic free fluid.  IMPRESSION: 1. Single live intrauterine pregnancy with a measured gestational age of [redacted] weeks and 6 days, concordant with the estimated gestational age based on the last menstrual period. No emergent pregnancy complication. No emergent maternal complication. 2. Calcification in the right ovary, nonspecific.   Electronically Signed  By: Amie Portland M.D.   On: 04/30/2015 12:51   US Ob Transvaginal  04/30/2015   CLINICAL DATA:  Pregnant patient with vaginal spotting hand pelvic cramps. Patient is 5 weeks and 6 days pregnant based on her last menstrual period.  EXAM: OBSTETRIC <14 WK [place] AND TRANSVAGINAL OB US  TECHNIQUE: Both transabdominal and transvaginal ultrasound examinations were performed for complete evaluation of the gestation as well as the maternal uterus, adnexal regions, and pelvic cul-de-sac. Transvaginal technique was performed to assess early pregnancy.  COMPARISON:  None.  FINDINGS: Intrauterine gestational sac: Visualized/normal in shape.  Yolk sac:  Yes  Embryo:  Yes  Cardiac Activity: Yes  Heart Rate: 100  bpm  CRL:  2.5  mm   5 w   6 d                  [place] EDC: 12/25/2015  Maternal uterus/adnexae: No uterine masses. No subchronic hemorrhage. No endometrial fluid. Normal cervix. There is an echogenic focus with shadowing in the right ovary measuring 6 mm. This is consistent with a calcification. Ovaries otherwise unremarkable. No adnexal masses. No  pelvic free fluid.  IMPRESSION: 1. Single live intrauterine pregnancy with a measured gestational age of [redacted] weeks and 6 days, concordant with the estimated gestational age based on the last menstrual period. No emergent pregnancy complication. No emergent maternal complication. 2. Calcification in the right ovary, nonspecific.   Electronically Signed   By: Amie Portland M.D.   On: 04/30/2015 12:51     Assessment and Plan  A:  SIUP at [redacted]w[redacted]d        Abdominal pain with pregnancy       Pregnancy of uncertain location, now confirmed IUP       Nausea and vomiting of pregnancy       Hx hyperemesis       No overt dehydration  P:  Discussed findings      Phenergan given      Wants Zofran. Explained with risks we usually try Phenergan first. To call her MD in am to ask about Zofran      Proof of pregnancy letter      Advance diet as tolerated  [facility] 04/30/2015, 11:59 AM

## 2015-04-30 NOTE — Discharge Instructions (Signed)
First Trimester of Pregnancy °The first trimester of pregnancy is from week 1 until the end of week 12 (months 1 through 3). A week after a sperm fertilizes an egg, the egg will implant on the wall of the uterus. This embryo will begin to develop into a baby. Genes from you and your partner are forming the baby. The female genes determine whether the baby is a boy or a girl. At 6-8 weeks, the eyes and face are formed, and the heartbeat can be seen on ultrasound. At the end of 12 weeks, all the baby's organs are formed.  °Now that you are pregnant, you will want to do everything you can to have a healthy baby. Two of the most important things are to get good prenatal care and to follow your health care provider's instructions. Prenatal care is all the medical care you receive before the baby's birth. This care will help prevent, find, and treat any problems during the pregnancy and childbirth. °BODY CHANGES °Your body goes through many changes during pregnancy. The changes vary from woman to woman.  °· You may gain or lose a couple of pounds at first. °· You may feel sick to your stomach (nauseous) and throw up (vomit). If the vomiting is uncontrollable, call your health care provider. °· You may tire easily. °· You may develop headaches that can be relieved by medicines approved by your health care provider. °· You may urinate more often. Painful urination may mean you have a bladder infection. °· You may develop heartburn as a result of your pregnancy. °· You may develop constipation because certain hormones are causing the muscles that push waste through your intestines to slow down. °· You may develop hemorrhoids or swollen, bulging veins (varicose veins). °· Your breasts may begin to grow larger and become tender. Your nipples may stick out more, and the tissue that surrounds them (areola) may become darker. °· Your gums may bleed and may be sensitive to brushing and flossing. °· Dark spots or blotches (chloasma,  mask of pregnancy) may develop on your face. This will likely fade after the baby is born. °· Your menstrual periods will stop. °· You may have a loss of appetite. °· You may develop cravings for certain kinds of food. °· You may have changes in your emotions from day to day, such as being excited to be pregnant or being concerned that something may go wrong with the pregnancy and baby. °· You may have more vivid and strange dreams. °· You may have changes in your hair. These can include thickening of your hair, rapid growth, and changes in texture. Some women also have hair loss during or after pregnancy, or hair that feels dry or thin. Your hair will most likely return to normal after your baby is born. °WHAT TO EXPECT AT YOUR PRENATAL VISITS °During a routine prenatal visit: °· You will be weighed to make sure you and the baby are growing normally. °· Your blood pressure will be taken. °· Your abdomen will be measured to track your baby's growth. °· The fetal heartbeat will be listened to starting around week 10 or 12 of your pregnancy. °· Test results from any previous visits will be discussed. °Your health care provider may ask you: °· How you are feeling. °· If you are feeling the baby move. °· If you have had any abnormal symptoms, such as leaking fluid, bleeding, severe headaches, or abdominal cramping. °· If you have any questions. °Other tests   that may be performed during your first trimester include: °· Blood tests to find your blood type and to check for the presence of any previous infections. They will also be used to check for low iron levels (anemia) and Rh antibodies. Later in the pregnancy, blood tests for diabetes will be done along with other tests if problems develop. °· Urine tests to check for infections, diabetes, or protein in the urine. °· An ultrasound to confirm the proper growth and development of the baby. °· An amniocentesis to check for possible genetic problems. °· Fetal screens for  spina bifida and Down syndrome. °· You may need other tests to make sure you and the baby are doing well. °HOME CARE INSTRUCTIONS  °Medicines °· Follow your health care provider's instructions regarding medicine use. Specific medicines may be either safe or unsafe to take during pregnancy. °· Take your prenatal vitamins as directed. °· If you develop constipation, try taking a stool softener if your health care provider approves. °Diet °· Eat regular, well-balanced meals. Choose a variety of foods, such as meat or vegetable-based protein, fish, milk and low-fat dairy products, vegetables, fruits, and whole grain breads and cereals. Your health care provider will help you determine the amount of weight gain that is right for you. °· Avoid raw meat and uncooked cheese. These carry germs that can cause birth defects in the baby. °· Eating four or five small meals rather than three large meals a day may help relieve nausea and vomiting. If you start to feel nauseous, eating a few soda crackers can be helpful. Drinking liquids between meals instead of during meals also seems to help nausea and vomiting. °· If you develop constipation, eat more high-fiber foods, such as fresh vegetables or fruit and whole grains. Drink enough fluids to keep your urine clear or pale yellow. °Activity and Exercise °· Exercise only as directed by your health care provider. Exercising will help you: °¨ Control your weight. °¨ Stay in shape. °¨ Be prepared for labor and delivery. °· Experiencing pain or cramping in the lower abdomen or low back is a good sign that you should stop exercising. Check with your health care provider before continuing normal exercises. °· Try to avoid standing for long periods of time. Move your legs often if you must stand in one place for a long time. °· Avoid heavy lifting. °· Wear low-heeled shoes, and practice good posture. °· You may continue to have sex unless your health care provider directs you  otherwise. °Relief of Pain or Discomfort °· Wear a good support bra for breast tenderness.   °· Take warm sitz baths to soothe any pain or discomfort caused by hemorrhoids. Use hemorrhoid cream if your health care provider approves.   °· Rest with your legs elevated if you have leg cramps or low back pain. °· If you develop varicose veins in your legs, wear support hose. Elevate your feet for 15 minutes, 3-4 times a day. Limit salt in your diet. °Prenatal Care °· Schedule your prenatal visits by the twelfth week of pregnancy. They are usually scheduled monthly at first, then more often in the last 2 months before delivery. °· Write down your questions. Take them to your prenatal visits. °· Keep all your prenatal visits as directed by your health care provider. °Safety °· Wear your seat belt at all times when driving. °· Make a list of emergency phone numbers, including numbers for family, friends, the hospital, and police and fire departments. °General Tips °·   Ask your health care provider for a referral to a local prenatal education class. Begin classes no later than at the beginning of month 6 of your pregnancy.  Ask for help if you have counseling or nutritional needs during pregnancy. Your health care provider can offer advice or refer you to specialists for help with various needs.  Do not use hot tubs, steam rooms, or saunas.  Do not douche or use tampons or scented sanitary pads.  Do not cross your legs for long periods of time.  Avoid cat litter boxes and soil used by cats. These carry germs that can cause birth defects in the baby and possibly loss of the fetus by miscarriage or stillbirth.  Avoid all smoking, herbs, alcohol, and medicines not prescribed by your health care provider. Chemicals in these affect the formation and growth of the baby.  Schedule a dentist appointment. At home, brush your teeth with a soft toothbrush and be gentle when you floss. SEEK MEDICAL CARE IF:   You have  dizziness.  You have mild pelvic cramps, pelvic pressure, or nagging pain in the abdominal area.  You have persistent nausea, vomiting, or diarrhea.  You have a bad smelling vaginal discharge.  You have pain with urination.  You notice increased swelling in your face, hands, legs, or ankles. SEEK IMMEDIATE MEDICAL CARE IF:   You have a fever.  You are leaking fluid from your vagina.  You have spotting or bleeding from your vagina.  You have severe abdominal cramping or pain.  You have rapid weight gain or loss.  You vomit blood or material that looks like coffee grounds.  You are exposed to [place] measles and have never had them.  You are exposed to fifth disease or chickenpox.  You develop a severe headache.  You have shortness of breath.  You have any kind of trauma, such as from a fall or a car accident. Document Released: 12/03/2001 Document Revised: 04/25/2014 Document Reviewed: 10/19/2013 [facility] Patient Information 2015 [place], [place]. This information is not intended to replace advice given to you by your health care provider. Make sure you discuss any questions you have with your health care provider. Hyperemesis Gravidarum Hyperemesis gravidarum is a severe form of nausea and vomiting that happens during pregnancy. Hyperemesis is worse than morning sickness. It may cause you to have nausea or vomiting all day for many days. It may keep you from eating and drinking enough food and liquids. Hyperemesis usually occurs during the first half (the first 20 weeks) of pregnancy. It often goes away once a woman is in her second half of pregnancy. However, sometimes hyperemesis continues through an entire pregnancy.  CAUSES  The cause of this condition is not completely known but is thought to be related to changes in the body's hormones when pregnant. It could be from the high level of the pregnancy hormone or an increase in estrogen in the body.  SIGNS AND SYMPTOMS    Severe nausea and vomiting.  Nausea that does not go away.  Vomiting that does not allow you to keep any food down.  Weight loss and body fluid loss (dehydration).  Having no desire to eat or not liking food you have previously enjoyed. DIAGNOSIS  Your health care provider will do a physical exam and ask you about your symptoms. He or she may also order blood tests and urine tests to make sure something else is not causing the problem.  TREATMENT  You may only need medicine  to control the problem. If medicines do not control the nausea and vomiting, you will be treated in the hospital to prevent dehydration, increased acid in the blood (acidosis), weight loss, and changes in the electrolytes in your body that may harm the unborn baby (fetus). You may need IV fluids.  HOME CARE INSTRUCTIONS   Only take over-the-counter or prescription medicines as directed by your health care provider.  Try eating a couple of dry crackers or toast in the morning before getting out of bed.  Avoid foods and smells that upset your stomach.  Avoid fatty and spicy foods.  Eat 5-6 small meals a day.  Do not drink when eating meals. Drink between meals.  For snacks, eat high-protein foods, such as cheese.  Eat or suck on things that have ginger in them. Ginger helps nausea.  Avoid food preparation. The smell of food can spoil your appetite.  Avoid iron pills and iron in your multivitamins until after 3-4 months of being pregnant. However, consult with your health care provider before stopping any prescribed iron pills. SEEK MEDICAL CARE IF:   Your abdominal pain increases.  You have a severe headache.  You have vision problems.  You are losing weight. SEEK IMMEDIATE MEDICAL CARE IF:   You are unable to keep fluids down.  You vomit blood.  You have constant nausea and vomiting.  You have excessive weakness.  You have extreme thirst.  You have dizziness or fainting.  You have a  fever or persistent symptoms for more than 2-3 days.  You have a fever and your symptoms suddenly get worse. MAKE SURE YOU:   Understand these instructions.  Will watch your condition.  Will get help right away if you are not doing well or get worse. Document Released: 12/09/2005 Document Revised: 09/29/2013 Document Reviewed: 07/21/2013 [facility] Patient Information 2015 [place]. This information is not intended to replace advice given to you by your health care provider. Make sure you discuss any questions you have with your health care provider.

## 2015-05-01 ENCOUNTER — Inpatient Hospital Stay (HOSPITAL_COMMUNITY)
Admission: AD | Admit: 2015-05-01 | Discharge: 2015-05-01 | Disposition: A | Discharge status: Home / Self Care | Payer: Medicaid Other | Source: Ambulatory Visit | Attending: Obstetrics and Gynecology | Admitting: Obstetrics and Gynecology

## 2015-05-01 LAB — HIV ANTIBODY (ROUTINE TESTING W REFLEX): HIV Screen 4th Generation wRfx: NONREACTIVE

## 2015-05-01 NOTE — MAU Note (Signed)
Not in lobby #3 

## 2015-05-01 NOTE — MAU Note (Signed)
Not in lobby

## 2015-05-02 ENCOUNTER — Inpatient Hospital Stay (HOSPITAL_COMMUNITY)
Admission: AD | Admit: 2015-05-02 | Discharge: 2015-05-02 | Disposition: A | Discharge status: Home / Self Care | Payer: Medicaid Other | Source: Ambulatory Visit | Attending: Family Medicine | Admitting: Family Medicine

## 2015-05-02 ENCOUNTER — Encounter (HOSPITAL_COMMUNITY): Payer: Self-pay | Admitting: *Deleted

## 2015-05-02 DIAGNOSIS — O99611 Diseases of the digestive system complicating pregnancy, first trimester: Secondary | ICD-10-CM | POA: Insufficient documentation

## 2015-05-02 DIAGNOSIS — Z3A01 Less than 8 weeks gestation of pregnancy: Secondary | ICD-10-CM | POA: Diagnosis not present

## 2015-05-02 DIAGNOSIS — O219 Vomiting of pregnancy, unspecified: Secondary | ICD-10-CM

## 2015-05-02 DIAGNOSIS — Z87891 Personal history of nicotine dependence: Secondary | ICD-10-CM | POA: Diagnosis not present

## 2015-05-02 DIAGNOSIS — K219 Gastro-esophageal reflux disease without esophagitis: Secondary | ICD-10-CM | POA: Insufficient documentation

## 2015-05-02 DIAGNOSIS — O210 Mild hyperemesis gravidarum: Secondary | ICD-10-CM | POA: Insufficient documentation

## 2015-05-02 LAB — URINALYSIS, ROUTINE W REFLEX MICROSCOPIC
Bilirubin Urine: NEGATIVE
GLUCOSE, UA: NEGATIVE mg/dL
HGB URINE DIPSTICK: NEGATIVE
Ketones, ur: >80 mg/dL — AB
Leukocytes, UA: NEGATIVE
Nitrite: NEGATIVE
Protein, ur: 30 mg/dL — AB
Specific Gravity, Urine: 1.025 (ref 1.005–1.030)
Urobilinogen, UA: 0.2 mg/dL (ref 0.0–1.0)
pH: 7 (ref 5.0–8.0)

## 2015-05-02 LAB — URINE MICROSCOPIC-ADD ON

## 2015-05-02 MED ORDER — METOCLOPRAMIDE HCL 10 MG PO TABS: 10.0000 mg | ORAL_TABLET | Freq: Four times a day (QID) | ORAL | Status: DC | PRN

## 2015-05-02 MED ORDER — PROMETHAZINE HCL 25 MG/ML IJ SOLN
25.0000 mg | Freq: Once | INTRAMUSCULAR | Status: DC
  Filled 2015-05-02: qty 1

## 2015-05-02 MED ORDER — METOCLOPRAMIDE HCL 5 MG/ML IJ SOLN
10.0000 mg | Freq: Once | INTRAMUSCULAR | Status: DC
  Filled 2015-05-02: qty 2

## 2015-05-02 MED ORDER — METOCLOPRAMIDE HCL 5 MG/ML IJ SOLN
10.0000 mg | Freq: Once | INTRAMUSCULAR | Status: AC
  Administered 2015-05-02: 10 mg via INTRAVENOUS
  Filled 2015-05-02: qty 2

## 2015-05-02 MED ORDER — PANTOPRAZOLE SODIUM 40 MG IV SOLR
40.0000 mg | Freq: Once | INTRAVENOUS | Status: AC
  Administered 2015-05-02: 40 mg via INTRAVENOUS
  Filled 2015-05-02: qty 40

## 2015-05-02 MED ORDER — LACTATED RINGERS IV BOLUS (SEPSIS)
1000.0000 mL | Freq: Once | INTRAVENOUS | Status: AC
  Administered 2015-05-02: 1000 mL via INTRAVENOUS

## 2015-05-02 MED ORDER — PANTOPRAZOLE SODIUM 40 MG PO TBEC: 40.0000 mg | DELAYED_RELEASE_TABLET | Freq: Every day | ORAL | Status: DC

## 2015-05-02 NOTE — MAU Note (Signed)
Pt has been having episodes of vomiting since 11pm last night. Stated she is feeling increasingly weak and SOB. Pt has history of asthma and panic attacks. Pt stated she is also having numbness in her fingers and her arms.

## 2015-05-02 NOTE — MAU Provider Note (Signed)
Chief Complaint: Shortness of Breath and Emesis During Pregnancy   First Provider Initiated Contact with Patient 05/02/15 1019      SUBJECTIVE HPI: Caitlin Sullivan is a 24 y.o. G3P1011 at 6150w1d by LMP who presents to [facility] reporting Vomiting repeatedly since 2200 last night and feeling weak w/ DOE this morning. Has been taking PO phenergan w/out improvement. Hx asthma, but denies wheezing.   [facility] but has not started care there yet. Hyperemesis last pregnancy. Live IUP verified 04/30/15 in MAU. No VB, abd pain.   Past Medical History  Diagnosis Date  . Umbilical hernia   . Hyperemesis arising during pregnancy   . Asthma   . Gall stones   . Iron deficiency anemia   . Heart murmur   . GERD (gastroesophageal reflux disease)    OB History  Gravida Para Term Preterm AB SAB TAB Ectopic Multiple Living  3 1 1  1  1   1     # Outcome Date GA Lbr Len/2nd Weight Sex Delivery Anes PTL Lv  3 Current           2 Term 07/19/13 212w2d 13:32 / 01:21 9 lb 11.4 oz (4.406 kg) M Vag-Spont EPI  Y  1 TAB              Past Surgical History  Procedure Laterality Date  . Pilonidal cyst / sinus excision    . Cholecystectomy    . Cholecystectomy N/A 09/22/2014    Procedure: LAPAROSCOPIC CHOLECYSTECTOMY;  Surgeon: Axel FillerArmando Ramirez, MD;  [facility];  Service: General;  Laterality: N/A;   History   Social History  . Marital Status: Single    Spouse Name: N/A  . Number of Children: N/A  . Years of Education: N/A   Occupational History  . Not on file.   Social History Main Topics  . Smoking status: Former Smoker -- 0.25 packs/day    Types: Cigarettes    Quit date: 09/16/2014  . Smokeless tobacco: Never Used  . Alcohol Use: No  . Drug Use: No  . Sexual Activity: Yes    Birth Control/ Protection: None   Other Topics Concern  . Not on file   Social History Narrative   No current facility-administered medications on file prior to encounter.    Current Outpatient Prescriptions on File Prior to Encounter  Medication Sig Dispense Refill  . albuterol (PROVENTIL HFA;VENTOLIN HFA) 108 (90 BASE) MCG/ACT inhaler Inhale 1-2 puffs into the lungs every 6 (six) hours as needed. For wheezing or shortness of breath    . Prenatal Vit-Fe Fumarate-FA (PRENATAL COMPLETE) 14-0.4 MG TABS Take 14 mg by mouth daily. (Patient not taking: Reported on 05/02/2015) 60 each 0  . promethazine (PHENERGAN) 25 MG tablet Take 1 tablet (25 mg total) by mouth every 6 (six) hours as needed for nausea or vomiting. 30 tablet 2   No Known Allergies  Review of Systems  Constitutional: Negative for fever and chills.  HENT: Negative for congestion and sore throat.   Respiratory: Positive for shortness of breath. Negative for cough and wheezing.   Cardiovascular: Negative for chest pain, palpitations and leg swelling.  Gastrointestinal: Positive for heartburn, nausea and vomiting. Negative for abdominal pain.  Genitourinary:       Neg for VB  Musculoskeletal: Negative for falls.  Neurological: Positive for tingling (in hands and feet) and weakness.    OBJECTIVE Blood pressure 124/69, pulse 71, temperature 98.4 F (36.9 C),  temperature source Oral, resp. rate 18, last menstrual period 03/20/2015, SpO2 98 %, not currently breastfeeding. GENERAL: Well-developed, well-nourished female in no acute distress.  HEART: normal rate. Mucus membranes moist.  RESP: normal effort GI: Abdomen soft, non-tender. MS: Nontender, no edema NEURO: Alert and oriented SPECULUM EXAM: Deferred  LAB RESULTS Results for orders placed or performed during the hospital encounter of 05/02/15 (from the past 24 hour(s))  Urinalysis, Routine w reflex microscopic     Status: Abnormal   Collection Time: 05/02/15 10:34 AM  Result Value Ref Range   Color, Urine YELLOW YELLOW   APPearance CLEAR CLEAR   Specific Gravity, Urine 1.025 1.005 - 1.030   pH 7.0 5.0 - 8.0   Glucose, UA NEGATIVE  NEGATIVE mg/dL   Hgb urine dipstick NEGATIVE NEGATIVE   Bilirubin Urine NEGATIVE NEGATIVE   Ketones, ur >80 (A) NEGATIVE mg/dL   Protein, ur 30 (A) NEGATIVE mg/dL   Urobilinogen, UA 0.2 0.0 - 1.0 mg/dL   Nitrite NEGATIVE NEGATIVE   Leukocytes, UA NEGATIVE NEGATIVE  Urine microscopic-add on     Status: None   Collection Time: 05/02/15 10:34 AM  Result Value Ref Range   Squamous Epithelial / LPF RARE RARE   WBC, UA 0-2 <3 WBC/hpf    IMAGING [place] Ob Comp Less 14 Wks  04/30/2015   CLINICAL DATA:  Pregnant patient with vaginal spotting hand pelvic cramps. Patient is 5 weeks and 6 days pregnant based on her last menstrual period.  EXAM: OBSTETRIC <14 WK US AND TRANSVAGINAL OB US  TECHNIQUE: Both transabdominal and transvaginal ultrasound examinations were performed for complete evaluation of the gestation as well as the maternal uterus, adnexal regions, and pelvic cul-de-sac. Transvaginal technique was performed to assess early pregnancy.  COMPARISON:  None.  FINDINGS: Intrauterine gestational sac: Visualized/normal in shape.  Yolk sac:  Yes  Embryo:  Yes  Cardiac Activity: Yes  Heart Rate: 100  bpm  CRL:  2.5  mm   5 w   6 d                  US EDC: 12/25/2015  Maternal uterus/adnexae: No uterine masses. No subchronic hemorrhage. No endometrial fluid. Normal cervix. There is an echogenic focus with shadowing in the right ovary measuring 6 mm. This is consistent with a calcification. Ovaries otherwise unremarkable. No adnexal masses. No pelvic free fluid.  IMPRESSION: 1. Single live intrauterine pregnancy with a measured gestational age of [redacted] weeks and 6 days, concordant with the estimated gestational age based on the last menstrual period. No emergent pregnancy complication. No emergent maternal complication. 2. Calcification in the right ovary, nonspecific.   Electronically Signed   By: Amie Portlandavid  Ormond M.D.   On: 04/30/2015 12:51   [place] Ob Transvaginal  04/30/2015   CLINICAL DATA:  Pregnant patient with  vaginal spotting hand pelvic cramps. Patient is 5 weeks and 6 days pregnant based on her last menstrual period.  EXAM: OBSTETRIC <14 WK US AND TRANSVAGINAL OB US  TECHNIQUE: Both transabdominal and transvaginal ultrasound examinations were performed for complete evaluation of the gestation as well as the maternal uterus, adnexal regions, and pelvic cul-de-sac. Transvaginal technique was performed to assess early pregnancy.  COMPARISON:  None.  FINDINGS: Intrauterine gestational sac: Visualized/normal in shape.  Yolk sac:  Yes  Embryo:  Yes  Cardiac Activity: Yes  Heart Rate: 100  bpm  CRL:  2.5  mm   5 w   6 d  [place] EDC: 12/25/2015  Maternal uterus/adnexae: No uterine masses. No subchronic hemorrhage. No endometrial fluid. Normal cervix. There is an echogenic focus with shadowing in the right ovary measuring 6 mm. This is consistent with a calcification. Ovaries otherwise unremarkable. No adnexal masses. No pelvic free fluid.  IMPRESSION: 1. Single live intrauterine pregnancy with a measured gestational age of [redacted] weeks and 6 days, concordant with the estimated gestational age based on the last menstrual period. No emergent pregnancy complication. No emergent maternal complication. 2. Calcification in the right ovary, nonspecific.   Electronically Signed   By: Amie Portland M.D.   On: 04/30/2015 12:51    MAU COURSE UA, IV bolus, Prefers not to try IV phenergan. Reglan ordered.   Nausea improved, but still present. Declines other meds.   ASSESSMENT 1. Nausea and vomiting of pregnancy, antepartum   2. Gastroesophageal reflux in pregancy in first trimester    PLAN Discharge home in stable condition. Hyperemesis diet     Follow-up Information    Follow up with Obstetrician of your choice.   Why:  Start prenatal care      Follow up with [facility] [place] [facility].   Why:  As needed in emergencies   Contact information:   [address] [id number] [place] [address] [phone]      Do not take phenergan w/in 6 hours of Reglan.   Medication List    TAKE these medications        albuterol 108 (90 BASE) MCG/ACT inhaler  Commonly known as:  PROVENTIL HFA;VENTOLIN HFA  Inhale 1-2 puffs into the lungs every 6 (six) hours as needed. For wheezing or shortness of breath     metoCLOPramide 10 MG tablet  Commonly known as:  REGLAN  Take 1-2 tablets (10-20 mg total) by mouth every 6 (six) hours as needed for nausea or vomiting.     pantoprazole 40 MG tablet  Commonly known as:  PROTONIX  Take 1 tablet (40 mg total) by mouth daily.     PRENATAL COMPLETE 14-0.4 MG Tabs  Take 14 mg by mouth daily.     promethazine 25 MG tablet  Commonly known as:  PHENERGAN  Take 1 tablet (25 mg total) by mouth every 6 (six) hours as needed for nausea or vomiting.       Clare, CNM 05/02/2015  12:44 PM

## 2015-05-02 NOTE — MAU Note (Signed)
[facility]

## 2015-05-02 NOTE — Discharge Instructions (Signed)
Morning Sickness °Morning sickness is when you feel sick to your stomach (nauseous) during pregnancy. This nauseous feeling may or may not come with vomiting. It often occurs in the morning but can be a problem any time of day. Morning sickness is most common during the first trimester, but it may continue throughout pregnancy. While morning sickness is unpleasant, it is usually harmless unless you develop severe and continual vomiting (hyperemesis gravidarum). This condition requires more intense treatment.  °CAUSES  °The cause of morning sickness is not completely known but seems to be related to normal hormonal changes that occur in pregnancy. °RISK FACTORS °You are at greater risk if you: °· Experienced nausea or vomiting before your pregnancy. °· Had morning sickness during a previous pregnancy. °· Are pregnant with more than one baby, such as twins. °TREATMENT  °Do not use any medicines (prescription, over-the-counter, or herbal) for morning sickness without first talking to your health care provider. Your health care provider may prescribe or recommend: °· Vitamin B6 supplements. °· Anti-nausea medicines. °· The herbal medicine ginger. °HOME CARE INSTRUCTIONS  °· Only take over-the-counter or prescription medicines as directed by your health care provider. °· Taking multivitamins before getting pregnant can prevent or decrease the severity of morning sickness in most women. °· Eat a piece of dry toast or unsalted crackers before getting out of bed in the morning. °· Eat five or six small meals a day. °· Eat dry and bland foods (rice, baked potato). Foods high in carbohydrates are often helpful. °· Do not drink liquids with your meals. Drink liquids between meals. °· Avoid greasy, fatty, and spicy foods. °· Get someone to cook for you if the smell of any food causes nausea and vomiting. °· If you feel nauseous after taking prenatal vitamins, take the vitamins at night or with a snack.  °· Snack on protein  foods (nuts, yogurt, cheese) between meals if you are hungry. °· Eat unsweetened gelatins for desserts. °· Wearing an acupressure wristband (worn for sea sickness) may be helpful. °· Acupuncture may be helpful. °· Do not smoke. °· Get a humidifier to keep the air in your house free of odors. °· Get plenty of fresh air. °SEEK MEDICAL CARE IF:  °· Your home remedies are not working, and you need medicine. °· You feel dizzy or lightheaded. °· You are losing weight. °SEEK IMMEDIATE MEDICAL CARE IF:  °· You have persistent and uncontrolled nausea and vomiting. °· You pass out (faint). °MAKE SURE YOU: °· Understand these instructions. °· Will watch your condition. °· Will get help right away if you are not doing well or get worse. °Document Released: 01/30/2007 Document Revised: 12/14/2013 Document Reviewed: 05/26/2013 °ExitCare® Patient Information ©2015 [employer]. This information is not intended to replace advice given to you by your health care provider. Make sure you discuss any questions you have with your health care provider. ° ° °Eating Plan for Hyperemesis Gravidarum °Severe cases of hyperemesis gravidarum can lead to dehydration and malnutrition. The hyperemesis eating plan is one way to lessen the symptoms of nausea and vomiting. It is often used with prescribed medicines to control your symptoms.  °WHAT CAN I DO TO RELIEVE MY SYMPTOMS? °Listen to your body. Everyone is different and has different preferences. Find what works best for you. Some of the following things may help: °· Eat and drink slowly. °· Eat 5-6 small meals daily instead of 3 large meals.   °· Eat crackers before you get out of bed in the   morning.   °· Starchy foods are usually well tolerated (such as cereal, toast, bread, potatoes, pasta, rice, and pretzels).   °· Ginger may help with nausea. Add ¼ tsp ground ginger to hot tea or choose ginger tea.   °· Try drinking 100% fruit juice or an electrolyte drink. °· Continue to take your  prenatal vitamins as directed by your health care provider. If you are having trouble taking your prenatal vitamins, talk with your health care provider about different options. °· Include at least 1 serving of protein with your meals and snacks (such as meats or poultry, beans, nuts, eggs, or yogurt). Try eating a protein-rich snack before bed (such as cheese and crackers or a half turkey or peanut butter sandwich). °WHAT THINGS SHOULD I AVOID TO REDUCE MY SYMPTOMS? °The following things may help reduce your symptoms: °· Avoid foods with strong smells. Try eating meals in well-ventilated areas that are free of odors. °· Avoid drinking water or other beverages with meals. Try not to drink anything less than 30 minutes before and after meals. °· Avoid drinking more than 1 cup of fluid at a time. °· Avoid fried or high-fat foods, such as butter and cream sauces. °· Avoid spicy foods. °· Avoid skipping meals the best you can. Nausea can be more intense on an empty stomach. If you cannot tolerate food at that time, do not force it. Try sucking on ice chips or other frozen items and make up the calories later. °· Avoid lying down within 2 hours after eating. °Document Released: 10/06/2007 Document Revised: 12/14/2013 Document Reviewed: 10/13/2013 °ExitCare® Patient Information ©2015 [employer]. This information is not intended to replace advice given to you by your health care provider. Make sure you discuss any questions you have with your health care provider. ° ° °

## 2015-05-04 ENCOUNTER — Encounter (HOSPITAL_COMMUNITY): Payer: Self-pay | Admitting: *Deleted

## 2015-05-04 ENCOUNTER — Inpatient Hospital Stay (HOSPITAL_COMMUNITY)
Admission: AD | Admit: 2015-05-04 | Discharge: 2015-05-04 | Disposition: A | Discharge status: Home / Self Care | Payer: Medicaid Other | Source: Ambulatory Visit | Attending: Obstetrics & Gynecology | Admitting: Obstetrics & Gynecology

## 2015-05-04 DIAGNOSIS — O23591 Infection of other part of genital tract in pregnancy, first trimester: Secondary | ICD-10-CM | POA: Insufficient documentation

## 2015-05-04 DIAGNOSIS — O219 Vomiting of pregnancy, unspecified: Secondary | ICD-10-CM

## 2015-05-04 DIAGNOSIS — N760 Acute vaginitis: Secondary | ICD-10-CM | POA: Insufficient documentation

## 2015-05-04 DIAGNOSIS — R109 Unspecified abdominal pain: Secondary | ICD-10-CM | POA: Diagnosis not present

## 2015-05-04 DIAGNOSIS — Z87891 Personal history of nicotine dependence: Secondary | ICD-10-CM | POA: Insufficient documentation

## 2015-05-04 DIAGNOSIS — O26899 Other specified pregnancy related conditions, unspecified trimester: Secondary | ICD-10-CM

## 2015-05-04 DIAGNOSIS — O9989 Other specified diseases and conditions complicating pregnancy, childbirth and the puerperium: Secondary | ICD-10-CM | POA: Diagnosis not present

## 2015-05-04 DIAGNOSIS — Z3A01 Less than 8 weeks gestation of pregnancy: Secondary | ICD-10-CM | POA: Insufficient documentation

## 2015-05-04 DIAGNOSIS — B9689 Other specified bacterial agents as the cause of diseases classified elsewhere: Secondary | ICD-10-CM | POA: Diagnosis not present

## 2015-05-04 DIAGNOSIS — O209 Hemorrhage in early pregnancy, unspecified: Secondary | ICD-10-CM | POA: Diagnosis present

## 2015-05-04 LAB — URINALYSIS, ROUTINE W REFLEX MICROSCOPIC
BILIRUBIN URINE: NEGATIVE
Glucose, UA: NEGATIVE mg/dL
KETONES UR: NEGATIVE mg/dL
Leukocytes, UA: NEGATIVE
NITRITE: NEGATIVE
PROTEIN: NEGATIVE mg/dL
SPECIFIC GRAVITY, URINE: 1.01 (ref 1.005–1.030)
UROBILINOGEN UA: 0.2 mg/dL (ref 0.0–1.0)
pH: 6.5 (ref 5.0–8.0)

## 2015-05-04 LAB — WET PREP, GENITAL
Trich, Wet Prep: NONE SEEN
Yeast Wet Prep HPF POC: NONE SEEN

## 2015-05-04 LAB — URINE MICROSCOPIC-ADD ON

## 2015-05-04 MED ORDER — METRONIDAZOLE 0.75 % VA GEL: 1.0000 | Freq: Two times a day (BID) | VAGINAL | Status: DC

## 2015-05-04 NOTE — Discharge Instructions (Signed)
Eating Plan for Hyperemesis Gravidarum Severe cases of hyperemesis gravidarum can lead to dehydration and malnutrition. The hyperemesis eating plan is one way to lessen the symptoms of nausea and vomiting. It is often used with prescribed medicines to control your symptoms.  WHAT CAN I DO TO RELIEVE MY SYMPTOMS? Listen to your body. Everyone is different and has different preferences. Find what works best for you. Some of the following things may help:  Eat and drink slowly.  Eat 5-6 small meals daily instead of 3 large meals.   Eat crackers before you get out of bed in the morning.   Starchy foods are usually well tolerated (such as cereal, toast, bread, potatoes, pasta, rice, and pretzels).   Ginger may help with nausea. Add  tsp ground ginger to hot tea or choose ginger tea.   Try drinking 100% fruit juice or an electrolyte drink.  Continue to take your prenatal vitamins as directed by your health care provider. If you are having trouble taking your prenatal vitamins, talk with your health care provider about different options.  Include at least 1 serving of protein with your meals and snacks (such as meats or poultry, beans, nuts, eggs, or yogurt). Try eating a protein-rich snack before bed (such as cheese and crackers or a half [place] or peanut butter sandwich). WHAT THINGS SHOULD I AVOID TO REDUCE MY SYMPTOMS? The following things may help reduce your symptoms:  Avoid foods with strong smells. Try eating meals in well-ventilated areas that are free of odors.  Avoid drinking water or other beverages with meals. Try not to drink anything less than 30 minutes before and after meals.  Avoid drinking more than 1 cup of fluid at a time.  Avoid fried or high-fat foods, such as butter and cream sauces.  Avoid spicy foods.  Avoid skipping meals the best you can. Nausea can be more intense on an empty stomach. If you cannot tolerate food at that time, do not force it. Try sucking on  ice chips or other frozen items and make up the calories later.  Avoid lying down within 2 hours after eating. Document Released: 10/06/2007 Document Revised: 12/14/2013 Document Reviewed: 10/13/2013 [facility] Patient Information 2015 [place], [place]. This information is not intended to replace advice given to you by your health care provider. Make sure you discuss any questions you have with your health care provider. Bacterial Vaginosis Bacterial vaginosis is a vaginal infection that occurs when the normal balance of bacteria in the vagina is disrupted. It results from an overgrowth of certain bacteria. This is the most common vaginal infection in women of childbearing age. Treatment is important to prevent complications, especially in pregnant women, as it can cause a premature delivery. CAUSES  Bacterial vaginosis is caused by an increase in harmful bacteria that are normally present in smaller amounts in the vagina. Several different kinds of bacteria can cause bacterial vaginosis. However, the reason that the condition develops is not fully understood. RISK FACTORS Certain activities or behaviors can put you at an increased risk of developing bacterial vaginosis, including:  Having a new sex partner or multiple sex partners.  Douching.  Using an intrauterine device (IUD) for contraception. Women do not get bacterial vaginosis from toilet seats, bedding, swimming pools, or contact with objects around them. SIGNS AND SYMPTOMS  Some women with bacterial vaginosis have no signs or symptoms. Common symptoms include:  Grey vaginal discharge.  A fishlike odor with discharge, especially after sexual intercourse.  Itching or burning  of the vagina and vulva.  Burning or pain with urination. DIAGNOSIS  Your health care provider will take a medical history and examine the vagina for signs of bacterial vaginosis. A sample of vaginal fluid may be taken. Your health care provider will look at this  sample under a microscope to check for bacteria and abnormal cells. A vaginal pH test may also be done.  TREATMENT  Bacterial vaginosis may be treated with antibiotic medicines. These may be given in the form of a pill or a vaginal cream. A second round of antibiotics may be prescribed if the condition comes back after treatment.  HOME CARE INSTRUCTIONS   Only take over-the-counter or prescription medicines as directed by your health care provider.  If antibiotic medicine was prescribed, take it as directed. Make sure you finish it even if you start to feel better.  Do not have sex until treatment is completed.  Tell all sexual partners that you have a vaginal infection. They should see their health care provider and be treated if they have problems, such as a mild rash or itching.  Practice safe sex by using condoms and only having one sex partner. SEEK MEDICAL CARE IF:   Your symptoms are not improving after 3 days of treatment.  You have increased discharge or pain.  You have a fever. MAKE SURE YOU:   Understand these instructions.  Will watch your condition.  Will get help right away if you are not doing well or get worse. FOR MORE INFORMATION  Centers for Disease Control and Prevention, Division of STD Prevention: SolutionApps.co.zawww.cdc.gov/std American Sexual Health Association (ASHA): www.ashastd.org  Document Released: 12/09/2005 Document Revised: 09/29/2013 Document Reviewed: 07/21/2013 [facility] Patient Information 2015 [place], [place]. This information is not intended to replace advice given to you by your health care provider. Make sure you discuss any questions you have with your health care provider.

## 2015-05-04 NOTE — MAU Note (Signed)
Pt also reports that she has been having vaginal bleeding since last night.

## 2015-05-04 NOTE — MAU Note (Signed)
Pt presents to MAU with complaints of nausea and vomiting. Reports she was given phenergan and reglan for the nausea but it isn't working

## 2015-05-04 NOTE — MAU Provider Note (Signed)
History     CSN: [id number]  Arrival date and time: 05/04/15 1003   First Provider Initiated Contact with Patient 05/04/15 1115      Chief Complaint  Patient presents with  . Morning Sickness   HPI  [name] is a 24 y.o. G3P1011 at [redacted]w[redacted]d who presents to MAU today with complaint of vaginal bleeding and lower abdominal pain. The patient states that she started bleeding like a period this morning. She noted spotting x 3 days prior to onset of heavier bleeding. She states associated lower abdominal cramping rated at 5/10 now. She hasn't taken anything for pain. She has also had N/V throughout the pregnancy. She is taking phenergan and protonix for this with minimal relief.   OB History    Gravida Para Term Preterm AB TAB SAB Ectopic Multiple Living   3 1 1  1 1    1       Past Medical History  Diagnosis Date  . Umbilical hernia   . Hyperemesis arising during pregnancy   . Asthma   . Gall stones   . Iron deficiency anemia   . Heart murmur   . GERD (gastroesophageal reflux disease)     Past Surgical History  Procedure Laterality Date  . Pilonidal cyst / sinus excision    . Cholecystectomy    . Cholecystectomy N/A 09/22/2014    Procedure: LAPAROSCOPIC CHOLECYSTECTOMY;  Surgeon: Axel FillerArmando Ramirez, MD;  [facility];  Service: General;  Laterality: N/A;    Family History  Problem Relation Age of Onset  . Cancer Mother   . Hypertension Mother   . Diabetes Father   . Hypertension Father   . Hyperlipidemia Father   . Stroke Father   . Asthma Brother     History  Substance Use Topics  . Smoking status: Former Smoker -- 0.25 packs/day    Types: Cigarettes    Quit date: 09/16/2014  . Smokeless tobacco: Never Used  . Alcohol Use: No    Allergies: No Known Allergies  Prescriptions prior to admission  Medication Sig Dispense Refill Last Dose  . metoCLOPramide (REGLAN) 10 MG tablet Take 1-2 tablets (10-20 mg total) by mouth every 6 (six) hours as needed for nausea  or vomiting. 60 tablet 2 05/03/2015 at Unknown time  . pantoprazole (PROTONIX) 40 MG tablet Take 1 tablet (40 mg total) by mouth daily. 30 tablet 6 05/04/2015 at Unknown time  . promethazine (PHENERGAN) 25 MG tablet Take 1 tablet (25 mg total) by mouth every 6 (six) hours as needed for nausea or vomiting. 30 tablet 2 05/04/2015 at Unknown time  . albuterol (PROVENTIL HFA;VENTOLIN HFA) 108 (90 BASE) MCG/ACT inhaler Inhale 1-2 puffs into the lungs every 6 (six) hours as needed. For wheezing or shortness of breath   rescue  . Prenatal Vit-Fe Fumarate-FA (PRENATAL COMPLETE) 14-0.4 MG TABS Take 14 mg by mouth daily. (Patient not taking: Reported on 05/02/2015) 60 each 0     Review of Systems  Constitutional: Negative for fever and malaise/fatigue.  Gastrointestinal: Positive for nausea, vomiting and abdominal pain. Negative for diarrhea and constipation.  Genitourinary: Negative for dysuria, urgency and frequency.       + vaginal bleeding, discharge   Physical Exam   Blood pressure 129/70, pulse 61, temperature 97.6 F (36.4 C), temperature source Oral, resp. rate 20, height 5\' 6"  (1.676 m), weight 204 lb (92.534 kg), last menstrual period 03/20/2015, not currently breastfeeding.  Physical Exam  Nursing note and vitals reviewed. Constitutional: She  is oriented to person, place, and time. She appears well-developed and well-nourished. No distress.  HENT:  Head: Normocephalic and atraumatic.  Cardiovascular: Normal rate.   Respiratory: Effort normal.  GI: Soft. She exhibits no distension and no mass. There is no tenderness. There is no rebound and no guarding.  Genitourinary: Uterus is not enlarged and not tender. Cervix exhibits no motion tenderness, no discharge and no friability. Right adnexum displays no mass and no tenderness. Left adnexum displays no mass and no tenderness. No bleeding in the vagina. Vaginal discharge (scant thin, white discharge noted) found.  Neurological: She is alert and  oriented to person, place, and time.  Skin: Skin is warm and dry. No erythema.  Psychiatric: She has a normal mood and affect.   Results for orders placed or performed during the hospital encounter of 05/04/15 (from the past 24 hour(s))  Urinalysis, Routine w reflex microscopic     Status: Abnormal   Collection Time: 05/04/15 10:42 AM  Result Value Ref Range   Color, Urine YELLOW YELLOW   APPearance CLEAR CLEAR   Specific Gravity, Urine 1.010 1.005 - 1.030   pH 6.5 5.0 - 8.0   Glucose, UA NEGATIVE NEGATIVE mg/dL   Hgb urine dipstick TRACE (A) NEGATIVE   Bilirubin Urine NEGATIVE NEGATIVE   Ketones, ur NEGATIVE NEGATIVE mg/dL   Protein, ur NEGATIVE NEGATIVE mg/dL   Urobilinogen, UA 0.2 0.0 - 1.0 mg/dL   Nitrite NEGATIVE NEGATIVE   Leukocytes, UA NEGATIVE NEGATIVE  Urine microscopic-add on     Status: None   Collection Time: 05/04/15 10:42 AM  Result Value Ref Range   Squamous Epithelial / LPF RARE RARE   WBC, UA 0-2 <3 WBC/hpf    MAU Course  Procedures None  MDM IUP on US from 04/30/15. Records reviewed.  GC/Chlamydia obtained incorrectly at last visit and unable to be processed. Repeat GC/Chlamydia and wet prep today.  No blood noted in the vagina Discussed risks vs benefits of Zofran in first trimester. Patient declines at this time and will continue current regiment Assessment and Plan  A: SIUP at [id number] Nausea and vomiting in pregnancy prior to [redacted] weeks gestation Abdominal pain in pregnancy Bacterial Vaginosis  P: Discharge home Rx for metrogel sent to patient's pharmacy First trimester warning signs discussed Patient advised to follow-up with [facility] to start prenatal care Patient may return to MAU as needed or if her condition were to change or worsen   Marny LowensteinJulie N Wenzel, PA-C  05/04/2015, 11:33 AM

## 2015-05-05 LAB — GC/CHLAMYDIA PROBE AMP (~~LOC~~) NOT AT ARMC
CHLAMYDIA, DNA PROBE: NEGATIVE
Neisseria Gonorrhea: NEGATIVE

## 2015-05-19 ENCOUNTER — Inpatient Hospital Stay (HOSPITAL_COMMUNITY)
Admission: EM | Admit: 2015-05-19 | Discharge: 2015-05-19 | Disposition: A | Discharge status: Home / Self Care | Payer: Medicaid Other | Source: Ambulatory Visit | Attending: Obstetrics and Gynecology | Admitting: Obstetrics and Gynecology

## 2015-05-19 ENCOUNTER — Inpatient Hospital Stay (HOSPITAL_COMMUNITY): Payer: Medicaid Other

## 2015-05-19 ENCOUNTER — Encounter (HOSPITAL_COMMUNITY): Payer: Self-pay | Admitting: *Deleted

## 2015-05-19 DIAGNOSIS — R197 Diarrhea, unspecified: Secondary | ICD-10-CM | POA: Insufficient documentation

## 2015-05-19 DIAGNOSIS — Z8249 Family history of ischemic heart disease and other diseases of the circulatory system: Secondary | ICD-10-CM | POA: Insufficient documentation

## 2015-05-19 DIAGNOSIS — O9989 Other specified diseases and conditions complicating pregnancy, childbirth and the puerperium: Secondary | ICD-10-CM | POA: Diagnosis not present

## 2015-05-19 DIAGNOSIS — R109 Unspecified abdominal pain: Secondary | ICD-10-CM | POA: Insufficient documentation

## 2015-05-19 DIAGNOSIS — O219 Vomiting of pregnancy, unspecified: Secondary | ICD-10-CM | POA: Insufficient documentation

## 2015-05-19 DIAGNOSIS — Z3A08 8 weeks gestation of pregnancy: Secondary | ICD-10-CM | POA: Insufficient documentation

## 2015-05-19 DIAGNOSIS — O26899 Other specified pregnancy related conditions, unspecified trimester: Secondary | ICD-10-CM

## 2015-05-19 DIAGNOSIS — Z87891 Personal history of nicotine dependence: Secondary | ICD-10-CM | POA: Insufficient documentation

## 2015-05-19 DIAGNOSIS — Z833 Family history of diabetes mellitus: Secondary | ICD-10-CM | POA: Diagnosis not present

## 2015-05-19 LAB — URINALYSIS, ROUTINE W REFLEX MICROSCOPIC
Bilirubin Urine: NEGATIVE
GLUCOSE, UA: NEGATIVE mg/dL
Hgb urine dipstick: NEGATIVE
Ketones, ur: >80 mg/dL — AB
Leukocytes, UA: NEGATIVE
NITRITE: NEGATIVE
Protein, ur: 30 mg/dL — AB
Specific Gravity, Urine: 1.02 (ref 1.005–1.030)
UROBILINOGEN UA: 0.2 mg/dL (ref 0.0–1.0)
pH: 8 (ref 5.0–8.0)

## 2015-05-19 LAB — COMPREHENSIVE METABOLIC PANEL
ALT: 32 U/L (ref 14–54)
ANION GAP: 6 (ref 5–15)
AST: 23 U/L (ref 15–41)
Albumin: 3.6 g/dL (ref 3.5–5.0)
Alkaline Phosphatase: 46 U/L (ref 38–126)
BILIRUBIN TOTAL: 0.6 mg/dL (ref 0.3–1.2)
BUN: 8 mg/dL (ref 6–20)
CALCIUM: 8.6 mg/dL — AB (ref 8.9–10.3)
CO2: 21 mmol/L — ABNORMAL LOW (ref 22–32)
CREATININE: 0.35 mg/dL — AB (ref 0.44–1.00)
Chloride: 109 mmol/L (ref 101–111)
GFR calc non Af Amer: >60 mL/min (ref >60)
GLUCOSE: 68 mg/dL (ref 65–99)
Potassium: 3.4 mmol/L — ABNORMAL LOW (ref 3.5–5.1)
SODIUM: 136 mmol/L (ref 135–145)
Total Protein: 6.9 g/dL (ref 6.5–8.1)

## 2015-05-19 LAB — CBC
HCT: 37.8 % (ref 36.0–46.0)
HEMOGLOBIN: 13 g/dL (ref 12.0–15.0)
MCH: 29.6 pg (ref 26.0–34.0)
MCHC: 34.4 g/dL (ref 30.0–36.0)
MCV: 86.1 fL (ref 78.0–100.0)
PLATELETS: 274 10*3/uL (ref 150–400)
RBC: 4.39 MIL/uL (ref 3.87–5.11)
RDW: 13.4 % (ref 11.5–15.5)
WBC: 13 10*3/uL — AB (ref 4.0–10.5)

## 2015-05-19 LAB — URINE MICROSCOPIC-ADD ON

## 2015-05-19 LAB — LIPASE, BLOOD: Lipase: 15 U/L — ABNORMAL LOW (ref 22–51)

## 2015-05-19 LAB — HCG, QUANTITATIVE, PREGNANCY: hCG, Beta Chain, Quant, S: 124083 m[IU]/mL — ABNORMAL HIGH (ref <5)

## 2015-05-19 MED ORDER — METOCLOPRAMIDE HCL 10 MG PO TABS
10.0000 mg | ORAL_TABLET | Freq: Three times a day (TID) | ORAL | Status: DC | PRN
Start: 2015-05-19 — End: 2015-09-19

## 2015-05-19 MED ORDER — METOCLOPRAMIDE HCL 5 MG/ML IJ SOLN
10.0000 mg | Freq: Once | INTRAMUSCULAR | Status: AC
  Administered 2015-05-19: 10 mg via INTRAVENOUS
  Filled 2015-05-19: qty 2

## 2015-05-19 MED ORDER — LACTATED RINGERS IV SOLN
INTRAVENOUS | Status: DC
  Administered 2015-05-19: 18:00:00 via INTRAVENOUS

## 2015-05-19 MED ORDER — PROMETHAZINE HCL 25 MG/ML IJ SOLN
25.0000 mg | Freq: Once | INTRAVENOUS | Status: AC
  Administered 2015-05-19: 25 mg via INTRAVENOUS
  Filled 2015-05-19: qty 1

## 2015-05-19 NOTE — MAU Note (Signed)
Diarrhea & vomiting for the past 3 days, phenergan is not helping.  Has shooting pain R side mid abdomen, radiates into back.  Denies bleeding.

## 2015-05-19 NOTE — MAU Provider Note (Signed)
History     CSN: [id number]  Arrival date and time: 05/19/15 1319   First Provider Initiated Contact with Patient 05/19/15 1508      Chief Complaint  Patient presents with  . Abdominal Pain  . Back Pain  . Emesis During Pregnancy  . Diarrhea   HPI Caitlin Sullivan 23 y.o. [id number]  presents to MAU complaining of stomach pain and diarrhea x 3 days.  Abdominal pain is worsening and radiating to spine.  It comes and goes about every 10 minutes starting at umbilicus and moving up and right.   Pain is 9/10.  She also has been vomiting many times after she eats each time.   She has tried phenergan but often vomits after taking it. Denies vaginal bleeding, dysuria, fever.  Reports Headache, weakness. OB History    Gravida Para Term Preterm AB TAB SAB Ectopic Multiple Living   Past Medical History  Diagnosis Date  . Umbilical hernia   . Hyperemesis arising during pregnancy   . Asthma   . Gall stones   . Iron deficiency anemia   . Heart murmur   . GERD (gastroesophageal reflux disease)     Past Surgical History  Procedure Laterality Date  . Pilonidal cyst / sinus excision    . Cholecystectomy    . Cholecystectomy N/A 09/22/2014    Procedure: LAPAROSCOPIC CHOLECYSTECTOMY;  Surgeon: Axel Filler, MD;  Location: [facility] OR;  Service: General;  Laterality: N/A;    Family History  Problem Relation Age of Onset  . Cancer Mother   . Hypertension Mother   . Diabetes Father   . Hypertension Father   . Hyperlipidemia Father   . Stroke Father   . Asthma Brother     History  Substance Use Topics  . Smoking status: Former Smoker -- 0.25 packs/day    Types: Cigarettes    Quit date: 09/16/2014  . Smokeless tobacco: Never Used  . Alcohol Use: No    Allergies: No Known Allergies  Prescriptions prior to admission  Medication Sig Dispense Refill Last Dose  . pantoprazole (PROTONIX) 40 MG tablet Take 1 tablet (40 mg total) by mouth daily. 30 tablet 6 05/19/2015  at Unknown time  . Prenatal Vit-Fe Fumarate-FA (PRENATAL COMPLETE) 14-0.4 MG TABS Take 14 mg by mouth daily. (Patient taking differently: Take 1 tablet by mouth daily. ) 60 each 0 05/18/2015 at Unknown time  . promethazine (PHENERGAN) 25 MG tablet Take 1 tablet (25 mg total) by mouth every 6 (six) hours as needed for nausea or vomiting. 30 tablet 2 05/19/2015 at Unknown time  . albuterol (PROVENTIL HFA;VENTOLIN HFA) 108 (90 BASE) MCG/ACT inhaler Inhale 1-2 puffs into the lungs every 6 (six) hours as needed. For wheezing or shortness of breath   rescue  . metoCLOPramide (REGLAN) 10 MG tablet Take 1-2 tablets (10-20 mg total) by mouth every 6 (six) hours as needed for nausea or vomiting. (Patient not taking: Reported on 05/19/2015) 60 tablet 2 Not Taking at Unknown time  . metroNIDAZOLE (METROGEL VAGINAL) 0.75 % vaginal gel Place 1 Applicatorful vaginally 2 (two) times daily. (Patient not taking: Reported on 05/19/2015) 70 g 0 Completed Course at Unknown time    ROS Pertinent ROS in HPI.  All other systems are negative.   Physical Exam   Blood pressure 117/77, pulse 96, temperature 97.9 F (36.6 C), temperature source Oral, resp. rate 20, last menstrual period 03/20/2015, not  currently breastfeeding.  Physical Exam  Constitutional: She is oriented to person, place, and time. She appears well-developed and well-nourished. No distress.  HENT:  Head: Normocephalic and atraumatic.  Eyes: EOM are normal.  Neck: Normal range of motion.  Cardiovascular: Normal rate, regular rhythm and normal heart sounds.   Respiratory: Effort normal and breath sounds normal. No respiratory distress.  GI: Soft. Bowel sounds are normal. She exhibits no distension. There is tenderness. There is no rebound.  Very tender right lower quadrant  Musculoskeletal: Normal range of motion.  Neurological: She is alert and oriented to person, place, and time.  Skin: Skin is warm and dry.  Psychiatric: She has a normal mood and  affect.   Results for orders placed or performed during the hospital encounter of 05/19/15 (from the past 24 hour(s))  Urinalysis, Routine w reflex microscopic (not at [facility])     Status: Abnormal   Collection Time: 05/19/15  2:10 PM  Result Value Ref Range   Color, Urine STRAW (A) YELLOW   APPearance CLEAR CLEAR   Specific Gravity, Urine 1.020 1.005 - 1.030   pH 8.0 5.0 - 8.0   Glucose, UA NEGATIVE NEGATIVE mg/dL   Hgb urine dipstick NEGATIVE NEGATIVE   Bilirubin Urine NEGATIVE NEGATIVE   Ketones, ur >80 (A) NEGATIVE mg/dL   Protein, ur 30 (A) NEGATIVE mg/dL   Urobilinogen, UA 0.2 0.0 - 1.0 mg/dL   Nitrite NEGATIVE NEGATIVE   Leukocytes, UA NEGATIVE NEGATIVE  Urine microscopic-add on     Status: Abnormal   Collection Time: 05/19/15  2:10 PM  Result Value Ref Range   Squamous Epithelial / LPF FEW (A) RARE   Urine-Other MUCOUS PRESENT   CBC     Status: Abnormal   Collection Time: 05/19/15  3:40 PM  Result Value Ref Range   WBC 13.0 (H) 4.0 - 10.5 K/uL   RBC 4.39 3.87 - 5.11 MIL/uL   Hemoglobin 13.0 12.0 - 15.0 g/dL   HCT 40.9 81.1 - 91.4 %   MCV 86.1 78.0 - 100.0 fL   MCH 29.6 26.0 - 34.0 pg   MCHC 34.4 30.0 - 36.0 g/dL   RDW 78.2 95.6 - 21.3 %   Platelets 274 150 - 400 K/uL  hCG, quantitative, pregnancy     Status: Abnormal   Collection Time: 05/19/15  3:45 PM  Result Value Ref Range   hCG, Beta Chain, Quant, S 086578 (H) <5 mIU/mL  Comprehensive metabolic panel     Status: Abnormal   Collection Time: 05/19/15  7:27 PM  Result Value Ref Range   Sodium 136 135 - 145 mmol/L   Potassium 3.4 (L) 3.5 - 5.1 mmol/L   Chloride 109 101 - 111 mmol/L   CO2 21 (L) 22 - 32 mmol/L   Glucose, Bld 68 65 - 99 mg/dL   BUN 8 6 - 20 mg/dL   Creatinine, Ser 4.69 (L) 0.44 - 1.00 mg/dL   Calcium 8.6 (L) 8.9 - 10.3 mg/dL   Total Protein 6.9 6.5 - 8.1 g/dL   Albumin 3.6 3.5 - 5.0 g/dL   AST 23 15 - 41 U/L   ALT 32 14 - 54 U/L   Alkaline Phosphatase 46 38 - 126 U/L   Total Bilirubin 0.6  0.3 - 1.2 mg/dL   GFR calc non Af Amer >60 >60 mL/min   GFR calc Af Amer >60 >60 mL/min   Anion gap 6 5 - 15   US Ob Transvaginal  05/19/2015   CLINICAL  DATA:  Right lower quadrant pain x3 days, pregnant  EXAM: TRANSVAGINAL OB ULTRASOUND  TECHNIQUE: Transvaginal ultrasound was performed for complete evaluation of the gestation as well as the maternal uterus, adnexal regions, and pelvic cul-de-sac.  COMPARISON:  04/30/2015  FINDINGS: Intrauterine gestational sac: Visualized/normal in shape.  Yolk sac:  Present  Embryo:  Present  Cardiac Activity: Present  Heart Rate: 165 bpm  CRL:   20.6  mm   8 w 5 d                  US EDC: 12/24/2015  Maternal uterus/adnexae: Small subchronic hemorrhage.  Bilateral ovaries within normal limits.  No free fluid.  IMPRESSION: Single live intrauterine gestation with estimated gestational age [redacted] weeks 5 days by crown-rump length.   Electronically Signed   By: Charline BillsSriyesh  Krishnan M.D.   On: 05/19/2015 17:05    MAU Course  Procedures  MDM Dehydration evident with ketones >80.  IVF + phenergan ordered.   CBC obtained.  U/S ordered for significant RLQ pain.   No elevated WBC count.  NO fever.   Discussed with Dr. Shawnie PonsPratt.  IV reglan ordered as well as CMP and Lipase.   Pt noted significant improvement with reglan and requesting discharge.    Assessment and Plan  A:  1. Nausea and vomiting in pregnancy prior to [redacted] weeks gestation   2. Abdominal pain affecting pregnancy   3. Diarrhea     P: Discharge to home Reglan rx for nausea Push PO [facility] Patient may return to MAU as needed or if her condition were to change or worsen   Bertram Denvereague Clark, Karen E 05/19/2015, 3:09 PM

## 2015-06-15 LAB — SICKLE CELL SCREEN: Sickle Cell Screen: NORMAL

## 2015-06-15 LAB — OB RESULTS CONSOLE PLATELET COUNT: PLATELETS: 281 10*3/uL

## 2015-06-15 LAB — OB RESULTS CONSOLE ANTIBODY SCREEN: ANTIBODY SCREEN: NEGATIVE

## 2015-06-15 LAB — OB RESULTS CONSOLE HGB/HCT, BLOOD
HCT: 37 %
Hemoglobin: 12.8 g/dL

## 2015-06-15 LAB — OB RESULTS CONSOLE GC/CHLAMYDIA
CHLAMYDIA, DNA PROBE: NEGATIVE
GC PROBE AMP, GENITAL: NEGATIVE

## 2015-06-15 LAB — OB RESULTS CONSOLE RPR
RPR: NONREACTIVE
RPR: NONREACTIVE

## 2015-06-15 LAB — OB RESULTS CONSOLE HEPATITIS B SURFACE ANTIGEN: HEP B S AG: NEGATIVE

## 2015-06-15 LAB — OB RESULTS CONSOLE ABO/RH: RH TYPE: POSITIVE

## 2015-06-15 LAB — OB RESULTS CONSOLE GBS: STREP GROUP B AG: NEGATIVE

## 2015-06-15 LAB — OB RESULTS CONSOLE HIV ANTIBODY (ROUTINE TESTING)
HIV: NONREACTIVE
HIV: NONREACTIVE

## 2015-06-15 LAB — OB RESULTS CONSOLE RUBELLA ANTIBODY, IGM: RUBELLA: IMMUNE

## 2015-07-07 ENCOUNTER — Inpatient Hospital Stay (HOSPITAL_COMMUNITY)
Admission: AD | Admit: 2015-07-07 | Discharge: 2015-07-07 | Disposition: A | Discharge status: Home / Self Care | Payer: Medicaid Other | Source: Ambulatory Visit | Attending: Obstetrics | Admitting: Obstetrics

## 2015-07-07 ENCOUNTER — Inpatient Hospital Stay (HOSPITAL_COMMUNITY): Payer: Medicaid Other

## 2015-07-07 ENCOUNTER — Encounter (HOSPITAL_COMMUNITY): Payer: Self-pay

## 2015-07-07 DIAGNOSIS — Z3A15 15 weeks gestation of pregnancy: Secondary | ICD-10-CM | POA: Insufficient documentation

## 2015-07-07 DIAGNOSIS — Z87891 Personal history of nicotine dependence: Secondary | ICD-10-CM | POA: Insufficient documentation

## 2015-07-07 DIAGNOSIS — O99612 Diseases of the digestive system complicating pregnancy, second trimester: Secondary | ICD-10-CM | POA: Diagnosis not present

## 2015-07-07 DIAGNOSIS — A084 Viral intestinal infection, unspecified: Secondary | ICD-10-CM

## 2015-07-07 DIAGNOSIS — R109 Unspecified abdominal pain: Secondary | ICD-10-CM | POA: Diagnosis present

## 2015-07-07 LAB — COMPREHENSIVE METABOLIC PANEL
ALK PHOS: 47 U/L (ref 38–126)
ALT: 18 U/L (ref 14–54)
AST: 17 U/L (ref 15–41)
Albumin: 3.6 g/dL (ref 3.5–5.0)
Anion gap: 7 (ref 5–15)
BILIRUBIN TOTAL: 0.5 mg/dL (ref 0.3–1.2)
BUN: 9 mg/dL (ref 6–20)
CO2: 23 mmol/L (ref 22–32)
CREATININE: 0.45 mg/dL (ref 0.44–1.00)
Calcium: 9 mg/dL (ref 8.9–10.3)
Chloride: 106 mmol/L (ref 101–111)
GFR calc Af Amer: >60 mL/min (ref >60)
Glucose, Bld: 120 mg/dL — ABNORMAL HIGH (ref 65–99)
Potassium: 3.2 mmol/L — ABNORMAL LOW (ref 3.5–5.1)
Sodium: 136 mmol/L (ref 135–145)
Total Protein: 7.4 g/dL (ref 6.5–8.1)

## 2015-07-07 LAB — URINALYSIS, ROUTINE W REFLEX MICROSCOPIC
Bilirubin Urine: NEGATIVE
Glucose, UA: NEGATIVE mg/dL
Hgb urine dipstick: NEGATIVE
Ketones, ur: 40 mg/dL — AB
LEUKOCYTES UA: NEGATIVE
Nitrite: NEGATIVE
Protein, ur: NEGATIVE mg/dL
Specific Gravity, Urine: 1.025 (ref 1.005–1.030)
Urobilinogen, UA: 0.2 mg/dL (ref 0.0–1.0)
pH: 6.5 (ref 5.0–8.0)

## 2015-07-07 LAB — CBC
HCT: 37.7 % (ref 36.0–46.0)
HEMOGLOBIN: 13 g/dL (ref 12.0–15.0)
MCH: 30 pg (ref 26.0–34.0)
MCHC: 34.5 g/dL (ref 30.0–36.0)
MCV: 87.1 fL (ref 78.0–100.0)
Platelets: 261 10*3/uL (ref 150–400)
RBC: 4.33 MIL/uL (ref 3.87–5.11)
RDW: 13.8 % (ref 11.5–15.5)
WBC: 17.2 10*3/uL — ABNORMAL HIGH (ref 4.0–10.5)

## 2015-07-07 LAB — AMYLASE: AMYLASE: 73 U/L (ref 28–100)

## 2015-07-07 LAB — LIPASE, BLOOD: Lipase: 16 U/L — ABNORMAL LOW (ref 22–51)

## 2015-07-07 MED ORDER — ONDANSETRON 8 MG PO TBDP: 8.0000 mg | ORAL_TABLET | Freq: Three times a day (TID) | ORAL | Status: DC | PRN

## 2015-07-07 MED ORDER — HYDROMORPHONE HCL 1 MG/ML IJ SOLN: 1.0000 mg | Freq: Once | INTRAMUSCULAR | Status: DC

## 2015-07-07 MED ORDER — DEXTROSE 5 % IN LACTATED RINGERS IV BOLUS: 1000.0000 mL | Freq: Once | INTRAVENOUS | Status: DC

## 2015-07-07 MED ORDER — ONDANSETRON 8 MG PO TBDP
8.0000 mg | ORAL_TABLET | Freq: Once | ORAL | Status: AC
  Administered 2015-07-07: 8 mg via ORAL
  Filled 2015-07-07: qty 1

## 2015-07-07 MED ORDER — PROMETHAZINE HCL 25 MG/ML IJ SOLN
25.0000 mg | Freq: Once | INTRAMUSCULAR | Status: AC
  Administered 2015-07-07: 25 mg via INTRAMUSCULAR
  Filled 2015-07-07: qty 1

## 2015-07-07 NOTE — Discharge Instructions (Signed)

## 2015-07-07 NOTE — MAU Provider Note (Signed)
History     CSN: [id number]  Arrival date and time: 07/07/15 2130   First Provider Initiated Contact with Patient 07/07/15 [phone]      Chief Complaint  Patient presents with  . Abdominal Pain   HPI Comments: Caitlin Sullivan is a 24 y.o. G3P1011 at [redacted]w[redacted]d who presents otday with sudden onset abdominal pain and nausea vomiting. She states that she woke up at 0200 with vomiting. She states that her son woke up soon afterward and started vomiting too. She denies any other sick contacts. She denies any suspect food intake. She denies any vaginal bleeding or LOF.   Abdominal Pain This is a new problem. The current episode started today. The onset quality is sudden (at 0200 ). The problem occurs intermittently ("comes in waves like contractions".). The problem has been unchanged. The pain is located in the generalized abdominal region. The pain is at a severity of 10/10. The quality of the pain is colicky. The abdominal pain does not radiate. Associated symptoms include nausea and vomiting. Pertinent negatives include no constipation, diarrhea, dysuria, fever or frequency. Nothing aggravates the pain. The pain is relieved by nothing. She has tried nothing for the symptoms.    Past Medical History  Diagnosis Date  . Umbilical hernia   . Hyperemesis arising during pregnancy   . Asthma   . Gall stones   . Iron deficiency anemia   . Heart murmur   . GERD (gastroesophageal reflux disease)     Past Surgical History  Procedure Laterality Date  . Pilonidal cyst / sinus excision    . Cholecystectomy    . Cholecystectomy N/A 09/22/2014    Procedure: LAPAROSCOPIC CHOLECYSTECTOMY;  Surgeon: Axel Filler, MD;  Location: [facility] OR;  Service: General;  Laterality: N/A;    Family History  Problem Relation Age of Onset  . Cancer Mother   . Hypertension Mother   . Diabetes Father   . Hypertension Father   . Hyperlipidemia Father   . Stroke Father   . Asthma Brother     History  Substance Use Topics   . Smoking status: Former Smoker -- 0.25 packs/day    Types: Cigarettes    Quit date: 09/16/2014  . Smokeless tobacco: Never Used  . Alcohol Use: No    Allergies: No Known Allergies  Prescriptions prior to admission  Medication Sig Dispense Refill Last Dose  . metoCLOPramide (REGLAN) 10 MG tablet Take 1 tablet (10 mg total) by mouth 3 (three) times daily as needed for nausea. 20 tablet 0 Past Month at Unknown time  . pantoprazole (PROTONIX) 40 MG tablet Take 1 tablet (40 mg total) by mouth daily. 30 tablet 6 07/06/2015 at Unknown time  . Prenatal Vit-Fe Fumarate-FA (PRENATAL COMPLETE) 14-0.4 MG TABS Take 14 mg by mouth daily. (Patient taking differently: Take 1 tablet by mouth daily. ) 60 each 0 07/06/2015 at Unknown time  . albuterol (PROVENTIL HFA;VENTOLIN HFA) 108 (90 BASE) MCG/ACT inhaler Inhale 1-2 puffs into the lungs every 6 (six) hours as needed. For wheezing or shortness of breath   rescue  . promethazine (PHENERGAN) 25 MG tablet Take 1 tablet (25 mg total) by mouth every 6 (six) hours as needed for nausea or vomiting. 30 tablet 2 More than a month at Unknown time    Review of Systems  Constitutional: Negative for fever.  Gastrointestinal: Positive for nausea, vomiting and abdominal pain. Negative for diarrhea and constipation.  Genitourinary: Negative for dysuria, urgency and frequency.   Physical Exam  Blood pressure 94/73, pulse 87, temperature 98.4 F (36.9 C), temperature source Oral, resp. rate 20, last menstrual period 03/20/2015, not currently breastfeeding.  Physical Exam  Nursing note and vitals reviewed. Constitutional: She is oriented to person, place, and time. She appears well-developed and well-nourished. She appears distressed (crying, appears uncomfortable. ).  HENT:  Head: Normocephalic.  Cardiovascular: Normal rate.   Respiratory: Effort normal.  GI: Soft. There is tenderness (generalized, mild ). There is no rebound.  Genitourinary:  Cervix:  closed/thick/high   Neurological: She is alert and oriented to person, place, and time.  Skin: Skin is warm and dry.  Psychiatric: She has a normal mood and affect.   Results for orders placed or performed during the hospital encounter of 07/07/15 (from the past 24 hour(s))  Urinalysis, Routine w reflex microscopic ([facility])     Status: Abnormal   Collection Time: 07/07/15  6:00 AM  Result Value Ref Range   Color, Urine YELLOW YELLOW   APPearance CLEAR CLEAR   Specific Gravity, Urine 1.025 1.005 - 1.030   pH 6.5 5.0 - 8.0   Glucose, UA NEGATIVE NEGATIVE mg/dL   Hgb urine dipstick NEGATIVE NEGATIVE   Bilirubin Urine NEGATIVE NEGATIVE   Ketones, ur 40 (A) NEGATIVE mg/dL   Protein, ur NEGATIVE NEGATIVE mg/dL   Urobilinogen, UA 0.2 0.0 - 1.0 mg/dL   Nitrite NEGATIVE NEGATIVE   Leukocytes, UA NEGATIVE NEGATIVE  CBC     Status: Abnormal   Collection Time: 07/07/15  6:10 AM  Result Value Ref Range   WBC 17.2 (H) 4.0 - 10.5 K/uL   RBC 4.33 3.87 - 5.11 MIL/uL   Hemoglobin 13.0 12.0 - 15.0 g/dL   HCT 21.337.7 08.636.0 - 57.846.0 %   MCV 87.1 78.0 - 100.0 fL   MCH 30.0 26.0 - 34.0 pg   MCHC 34.5 30.0 - 36.0 g/dL   RDW 46.913.8 62.911.5 - 52.815.5 %   Platelets 261 150 - 400 K/uL    MAU Course  Procedures  MDM [id number]: Patient reports that she is now having diarrhea  0653: D/W Dr. Chestine Sporelark ok for DC home. Can have Zofran ODT.   Assessment and Plan   1. Viral gastroenteritis   2. Sudden onset of severe abdominal pain    DC home Comfort measures reviewed, BRAT diet  2nd Trimester precautions  RX: Zofran 8mg   #20 Return to MAU as needed FU with OB as planned  Follow-up Information    [facility].   Specialty:  Obstetrics   Why:  As scheduled   Contact information:   [address] [place] [address] [phone]         [name], [name] 07/07/2015, 5:19 AM

## 2015-07-07 NOTE — MAU Note (Signed)
Pt states abdominal pain that feels like contractions, started earlier tonight. Denies bleeding or LOF. Has had some nausea and vomiting. Denies being around anyone sick.

## 2015-07-27 LAB — AFP, QUAD SCREEN: AFP, Serum: NEGATIVE

## 2015-07-27 LAB — CULTURE, OB URINE

## 2015-09-19 ENCOUNTER — Encounter (HOSPITAL_COMMUNITY): Payer: Self-pay | Admitting: *Deleted

## 2015-09-19 ENCOUNTER — Inpatient Hospital Stay (HOSPITAL_COMMUNITY)
Admission: AD | Admit: 2015-09-19 | Discharge: 2015-09-19 | Disposition: A | Discharge status: Home / Self Care | Payer: Medicaid Other | Source: Ambulatory Visit | Attending: Obstetrics and Gynecology | Admitting: Obstetrics and Gynecology

## 2015-09-19 DIAGNOSIS — Z3A26 26 weeks gestation of pregnancy: Secondary | ICD-10-CM | POA: Diagnosis not present

## 2015-09-19 DIAGNOSIS — O210 Mild hyperemesis gravidarum: Secondary | ICD-10-CM | POA: Diagnosis not present

## 2015-09-19 DIAGNOSIS — O212 Late vomiting of pregnancy: Secondary | ICD-10-CM | POA: Diagnosis present

## 2015-09-19 LAB — COMPREHENSIVE METABOLIC PANEL
ALT: 11 U/L — AB (ref 14–54)
AST: 15 U/L (ref 15–41)
Albumin: 3.5 g/dL (ref 3.5–5.0)
Alkaline Phosphatase: 59 U/L (ref 38–126)
Anion gap: 8 (ref 5–15)
BUN: 8 mg/dL (ref 6–20)
CO2: 24 mmol/L (ref 22–32)
Calcium: 8.9 mg/dL (ref 8.9–10.3)
Chloride: 104 mmol/L (ref 101–111)
Creatinine, Ser: 0.48 mg/dL (ref 0.44–1.00)
Glucose, Bld: 129 mg/dL — ABNORMAL HIGH (ref 65–99)
POTASSIUM: 3.4 mmol/L — AB (ref 3.5–5.1)
SODIUM: 136 mmol/L (ref 135–145)
Total Bilirubin: 0.4 mg/dL (ref 0.3–1.2)
Total Protein: 7.1 g/dL (ref 6.5–8.1)

## 2015-09-19 LAB — URINALYSIS, ROUTINE W REFLEX MICROSCOPIC
BILIRUBIN URINE: NEGATIVE
Glucose, UA: NEGATIVE mg/dL
Hgb urine dipstick: NEGATIVE
NITRITE: NEGATIVE
PROTEIN: 100 mg/dL — AB
Specific Gravity, Urine: 1.015 (ref 1.005–1.030)
UROBILINOGEN UA: 1 mg/dL (ref 0.0–1.0)
pH: 8.5 — ABNORMAL HIGH (ref 5.0–8.0)

## 2015-09-19 LAB — CBC
HCT: 34.9 % — ABNORMAL LOW (ref 36.0–46.0)
Hemoglobin: 11.8 g/dL — ABNORMAL LOW (ref 12.0–15.0)
MCH: 29.6 pg (ref 26.0–34.0)
MCHC: 33.8 g/dL (ref 30.0–36.0)
MCV: 87.7 fL (ref 78.0–100.0)
PLATELETS: 271 10*3/uL (ref 150–400)
RBC: 3.98 MIL/uL (ref 3.87–5.11)
RDW: 13.8 % (ref 11.5–15.5)
WBC: 18 10*3/uL — AB (ref 4.0–10.5)

## 2015-09-19 LAB — AMYLASE: AMYLASE: 97 U/L (ref 28–100)

## 2015-09-19 LAB — URINE MICROSCOPIC-ADD ON

## 2015-09-19 LAB — LIPASE, BLOOD: Lipase: 19 U/L — ABNORMAL LOW (ref 22–51)

## 2015-09-19 MED ORDER — DEXTROSE 5 % IN LACTATED RINGERS IV BOLUS: 1000.0000 mL | Freq: Once | INTRAVENOUS | Status: DC

## 2015-09-19 MED ORDER — LACTATED RINGERS IV BOLUS (SEPSIS)
1000.0000 mL | Freq: Once | INTRAVENOUS | Status: AC
  Administered 2015-09-19: 1000 mL via INTRAVENOUS

## 2015-09-19 MED ORDER — ONDANSETRON 8 MG PO TBDP: 8.0000 mg | ORAL_TABLET | Freq: Three times a day (TID) | ORAL | Status: DC | PRN

## 2015-09-19 MED ORDER — PROMETHAZINE HCL 25 MG PO TABS: 25.0000 mg | ORAL_TABLET | Freq: Four times a day (QID) | ORAL | Status: DC | PRN

## 2015-09-19 MED ORDER — FAMOTIDINE IN NACL 20-0.9 MG/50ML-% IV SOLN
20.0000 mg | Freq: Once | INTRAVENOUS | Status: AC
  Administered 2015-09-19: 20 mg via INTRAVENOUS
  Filled 2015-09-19: qty 50

## 2015-09-19 MED ORDER — SODIUM CHLORIDE 0.9 % IV SOLN
8.0000 mg | Freq: Once | INTRAVENOUS | Status: AC
  Administered 2015-09-19: 8 mg via INTRAVENOUS
  Filled 2015-09-19: qty 4

## 2015-09-19 MED ORDER — FAMOTIDINE 20 MG PO TABS: 20.0000 mg | ORAL_TABLET | Freq: Two times a day (BID) | ORAL | Status: DC

## 2015-09-19 MED ORDER — M.V.I. ADULT IV INJ
Freq: Once | INTRAVENOUS | Status: AC
  Administered 2015-09-19: 10:00:00 via INTRAVENOUS
  Filled 2015-09-19: qty 10

## 2015-09-19 MED ORDER — PROMETHAZINE HCL 25 MG/ML IJ SOLN
25.0000 mg | Freq: Once | INTRAMUSCULAR | Status: AC
  Administered 2015-09-19: 25 mg via INTRAVENOUS
  Filled 2015-09-19: qty 1

## 2015-09-19 NOTE — MAU Note (Signed)
Patient states she has missed some appts at [facility]. States she was not happy with the way her N/V was being managed.

## 2015-09-19 NOTE — MAU Note (Signed)
Started vomiting Sunday night.  Has continued, pt a little unsteady. Denies fever or diarrhea. No one at home is sick

## 2015-09-19 NOTE — Discharge Instructions (Signed)
Hyperemesis Gravidarum °Hyperemesis gravidarum is a severe form of nausea and vomiting that happens during pregnancy. Hyperemesis is worse than morning sickness. It may cause you to have nausea or vomiting all day for many days. It may keep you from eating and drinking enough food and liquids. Hyperemesis usually occurs during the first half (the first 20 weeks) of pregnancy. It often goes away once a woman is in her second half of pregnancy. However, sometimes hyperemesis continues through an entire pregnancy.  °CAUSES  °The cause of this condition is not completely known but is thought to be related to changes in the body's hormones when pregnant. It could be from the high level of the pregnancy hormone or an increase in estrogen in the body.  °SIGNS AND SYMPTOMS  °· Severe nausea and vomiting. °· Nausea that does not go away. °· Vomiting that does not allow you to keep any food down. °· Weight loss and body fluid loss (dehydration). °· Having no desire to eat or not liking food you have previously enjoyed. °DIAGNOSIS  °Your health care provider will do a physical exam and ask you about your symptoms. He or she may also order blood tests and urine tests to make sure something else is not causing the problem.  °TREATMENT  °You may only need medicine to control the problem. If medicines do not control the nausea and vomiting, you will be treated in the hospital to prevent dehydration, increased acid in the blood (acidosis), weight loss, and changes in the electrolytes in your body that may harm the unborn baby (fetus). You may need IV fluids.  °HOME CARE INSTRUCTIONS  °· Only take over-the-counter or prescription medicines as directed by your health care provider. °· Try eating a couple of dry crackers or toast in the morning before getting out of bed. °· Avoid foods and smells that upset your stomach. °· Avoid fatty and spicy foods. °· Eat 5-6 small meals a day. °· Do not drink when eating meals. Drink between  meals. °· For snacks, eat high-protein foods, such as cheese. °· Eat or suck on things that have ginger in them. Ginger helps nausea. °· Avoid food preparation. The smell of food can spoil your appetite. °· Avoid iron pills and iron in your multivitamins until after 3-4 months of being pregnant. However, consult with your health care provider before stopping any prescribed iron pills. °SEEK MEDICAL CARE IF:  °· Your abdominal pain increases. °· You have a severe headache. °· You have vision problems. °· You are losing weight. °SEEK IMMEDIATE MEDICAL CARE IF:  °· You are unable to keep fluids down. °· You vomit blood. °· You have constant nausea and vomiting. °· You have excessive weakness. °· You have extreme thirst. °· You have dizziness or fainting. °· You have a fever or persistent symptoms for more than 2-3 days. °· You have a fever and your symptoms suddenly get worse. °MAKE SURE YOU:  °· Understand these instructions. °· Will watch your condition. °· Will get help right away if you are not doing well or get worse. °Document Released: 12/09/2005 Document Revised: 09/29/2013 Document Reviewed: 07/21/2013 °Exit[employer]® Patient Information ©2015 [employer]. This information is not intended to replace advice given to you by your health care provider. Make sure you discuss any questions you have with your health care provider. ° ° °Eating Plan for Hyperemesis Gravidarum °Severe cases of hyperemesis gravidarum can lead to dehydration and malnutrition. The hyperemesis eating plan is one way to lessen   the symptoms of nausea and vomiting. It is often used with prescribed medicines to control your symptoms.  °WHAT CAN I DO TO RELIEVE MY SYMPTOMS? °Listen to your body. Everyone is different and has different preferences. Find what works best for you. Some of the following things may help: °· Eat and drink slowly. °· Eat 5-6 small meals daily instead of 3 large meals.   °· Eat crackers before you get out of bed in the  morning.   °· Starchy foods are usually well tolerated (such as cereal, toast, bread, potatoes, pasta, rice, and pretzels).   °· Ginger may help with nausea. Add ¼ tsp ground ginger to hot tea or choose ginger tea.   °· Try drinking 100% fruit juice or an electrolyte drink. °· Continue to take your prenatal vitamins as directed by your health care provider. If you are having trouble taking your prenatal vitamins, talk with your health care provider about different options. °· Include at least 1 serving of protein with your meals and snacks (such as meats or poultry, beans, nuts, eggs, or yogurt). Try eating a protein-rich snack before bed (such as cheese and crackers or a half turkey or peanut butter sandwich). °WHAT THINGS SHOULD I AVOID TO REDUCE MY SYMPTOMS? °The following things may help reduce your symptoms: °· Avoid foods with strong smells. Try eating meals in well-ventilated areas that are free of odors. °· Avoid drinking water or other beverages with meals. Try not to drink anything less than 30 minutes before and after meals. °· Avoid drinking more than 1 cup of fluid at a time. °· Avoid fried or high-fat foods, such as butter and cream sauces. °· Avoid spicy foods. °· Avoid skipping meals the best you can. Nausea can be more intense on an empty stomach. If you cannot tolerate food at that time, do not force it. Try sucking on ice chips or other frozen items and make up the calories later. °· Avoid lying down within 2 hours after eating. °Document Released: 10/06/2007 Document Revised: 12/14/2013 Document Reviewed: 10/13/2013 °ExitCare® Patient Information ©2015 [employer]. This information is not intended to replace advice given to you by your health care provider. Make sure you discuss any questions you have with your health care provider. ° °

## 2015-09-19 NOTE — MAU Provider Note (Signed)
History     CSN: [id number]  Arrival date and time: 09/19/15 2725   First Provider Initiated Contact with Patient 09/19/15 [phone]         Chief Complaint  Patient presents with  . Emesis   HPI  Caitlin Sullivan is a 24 y.o. G3P1011 at [redacted]w[redacted]d who presents with nausea & vomiting.  N/v daily throughout pregnancy; has been taking phenergan & zofran with relief. This episode started Sunday night, has not been able to control with meds. States has vomited countless times since last night. Last took phenergan last night at 11 pm. States only has 5 pills left.  Reports upper abdominal "soreness" & heartburn.  Denies LOF, vaginal bleeding, abdominal cramping/contractions, fever, diarrhea, dysuria.  Gall bladder removed last year.  Missed last 2 visits at [facility]; states she thinks she may have been discharged from the practice but hasn't received a letter. Wants to go to another doctor.     OB History    Gravida Para Term Preterm AB TAB SAB Ectopic Multiple Living   Past Medical History  Diagnosis Date  . Umbilical hernia   . Hyperemesis arising during pregnancy   . Asthma   . Gall stones   . Iron deficiency anemia   . Heart murmur   . GERD (gastroesophageal reflux disease)     Past Surgical History  Procedure Laterality Date  . Pilonidal cyst / sinus excision    . Cholecystectomy    . Cholecystectomy N/A 09/22/2014    Procedure: LAPAROSCOPIC CHOLECYSTECTOMY;  Surgeon: Axel Filler, MD;  Location: [facility] OR;  Service: General;  Laterality: N/A;    Family History  Problem Relation Age of Onset  . Cancer Mother   . Hypertension Mother   . Diabetes Father   . Hypertension Father   . Hyperlipidemia Father   . Stroke Father   . Asthma Brother     Social History  Substance Use Topics  . Smoking status: Former Smoker -- 0.25 packs/day    Types: Cigarettes    Quit date: 09/16/2014  . Smokeless tobacco: Never Used  . Alcohol Use: No     Allergies: No Known Allergies  Prescriptions prior to admission  Medication Sig Dispense Refill Last Dose  . albuterol (PROVENTIL HFA;VENTOLIN HFA) 108 (90 BASE) MCG/ACT inhaler Inhale 1-2 puffs into the lungs every 6 (six) hours as needed. For wheezing or shortness of breath   rescue  . metoCLOPramide (REGLAN) 10 MG tablet Take 1 tablet (10 mg total) by mouth 3 (three) times daily as needed for nausea. 20 tablet 0 Past Month at Unknown time  . ondansetron (ZOFRAN ODT) 8 MG disintegrating tablet Take 1 tablet (8 mg total) by mouth every 8 (eight) hours as needed for nausea or vomiting. 20 tablet 0   . pantoprazole (PROTONIX) 40 MG tablet Take 1 tablet (40 mg total) by mouth daily. 30 tablet 6 07/06/2015 at Unknown time  . Prenatal Vit-Fe Fumarate-FA (PRENATAL COMPLETE) 14-0.4 MG TABS Take 14 mg by mouth daily. (Patient taking differently: Take 1 tablet by mouth daily. ) 60 each 0 07/06/2015 at Unknown time  . promethazine (PHENERGAN) 25 MG tablet Take 1 tablet (25 mg total) by mouth every 6 (six) hours as needed for nausea or vomiting. 30 tablet 2 More than a month at Unknown time    Review of Systems  Constitutional: Negative.  Negative for fever.  Cardiovascular: Negative.  Gastrointestinal: Positive for heartburn, nausea, vomiting and abdominal pain. Negative for diarrhea and constipation.  Genitourinary: Negative for dysuria.       No vaginal bleeding or LOF   Physical Exam   Blood pressure 128/84, pulse 98, temperature 98 F (36.7 C), temperature source Oral, resp. rate 22, last menstrual period 03/20/2015.  Physical Exam  Nursing note and vitals reviewed. Constitutional: She is oriented to person, place, and time. She appears well-developed and well-nourished. No distress.  HENT:  Head: Normocephalic and atraumatic.  Eyes: Conjunctivae are normal. Right eye exhibits no discharge. Left eye exhibits no discharge. No scleral icterus.  Neck: Normal range of motion.   Cardiovascular: Normal rate, regular rhythm and normal heart sounds.   No murmur heard. Respiratory: Effort normal and breath sounds normal. No respiratory distress. She has no wheezes.  GI: Soft. Bowel sounds are normal. She exhibits distension (appropriate for gestation). There is tenderness ("some soreness" in epigastric area with palpation). There is no rebound and no guarding.  Neurological: She is alert and oriented to person, place, and time.  Skin: Skin is warm and dry. She is not diaphoretic.  Psychiatric: She has a normal mood and affect. Her behavior is normal. Judgment and thought content normal.   Fetal Tracing:  Baseline: 145 Variability: moderate Accelerations: none Decelerations: small variables  Toco: none   MAU Course  Procedures Results for orders placed or performed during the hospital encounter of 09/19/15 (from the past 24 hour(s))  CBC     Status: Abnormal   Collection Time: 09/19/15  8:35 AM  Result Value Ref Range   WBC 18.0 (H) 4.0 - 10.5 K/uL   RBC 3.98 3.87 - 5.11 MIL/uL   Hemoglobin 11.8 (L) 12.0 - 15.0 g/dL   HCT 16.1 (L) 09.6 - 04.5 %   MCV 87.7 78.0 - 100.0 fL   MCH 29.6 26.0 - 34.0 pg   MCHC 33.8 30.0 - 36.0 g/dL   RDW 40.9 81.1 - 91.4 %   Platelets 271 150 - 400 K/uL  Comprehensive metabolic panel     Status: Abnormal   Collection Time: 09/19/15  8:35 AM  Result Value Ref Range   Sodium 136 135 - 145 mmol/L   Potassium 3.4 (L) 3.5 - 5.1 mmol/L   Chloride 104 101 - 111 mmol/L   CO2 24 22 - 32 mmol/L   Glucose, Bld 129 (H) 65 - 99 mg/dL   BUN 8 6 - 20 mg/dL   Creatinine, Ser 7.82 0.44 - 1.00 mg/dL   Calcium 8.9 8.9 - 95.6 mg/dL   Total Protein 7.1 6.5 - 8.1 g/dL   Albumin 3.5 3.5 - 5.0 g/dL   AST 15 15 - 41 U/L   ALT 11 (L) 14 - 54 U/L   Alkaline Phosphatase 59 38 - 126 U/L   Total Bilirubin 0.4 0.3 - 1.2 mg/dL   GFR calc non Af Amer >60 >60 mL/min   GFR calc Af Amer >60 >60 mL/min   Anion gap 8 5 - 15  Amylase     Status: None    Collection Time: 09/19/15  8:35 AM  Result Value Ref Range   Amylase 97 28 - 100 U/L  Lipase, blood     Status: Abnormal   Collection Time: 09/19/15  8:35 AM  Result Value Ref Range   Lipase 19 (L) 22 - 51 U/L  Urinalysis, Routine w reflex microscopic (not at [facility])     Status: Abnormal   Collection Time: 09/19/15  9:06 AM  Result Value Ref Range   Color, Urine YELLOW YELLOW   APPearance CLOUDY (A) CLEAR   Specific Gravity, Urine 1.015 1.005 - 1.030   pH 8.5 (H) 5.0 - 8.0   Glucose, UA NEGATIVE NEGATIVE mg/dL   Hgb urine dipstick NEGATIVE NEGATIVE   Bilirubin Urine NEGATIVE NEGATIVE   Ketones, ur >80 (A) NEGATIVE mg/dL   Protein, ur 409 (A) NEGATIVE mg/dL   Urobilinogen, UA 1.0 0.0 - 1.0 mg/dL   Nitrite NEGATIVE NEGATIVE   Leukocytes, UA TRACE (A) NEGATIVE  Urine microscopic-add on     Status: Abnormal   Collection Time: 09/19/15  9:06 AM  Result Value Ref Range   Squamous Epithelial / LPF FEW (A) RARE   WBC, UA 0-2 <3 WBC/hpf   RBC / HPF 0-2 <3 RBC/hpf   Bacteria, UA FEW (A) RARE   Urine-Other MUCOUS PRESENT     MDM IV fluids Zofran & pepcid IV MVI bag [id number]-D/w Dr. Claiborne Billings. Pending discharge from practice. Ok to refill nausea meds & discharge home if improvement with fluids & meds.   Assessment and Plan  A: 1. Hyperemesis of pregnancy    P: Discharge home List of providers given; stressed importance of resuming prenatal care ASAP Rx phenergan, zofran, pepcid Hyperemesis diet given Discussed reasons to return to MAU Urine culture pending  Judeth Horn, NP   09/19/2015, 8:08 AM

## 2015-09-20 LAB — CULTURE, OB URINE

## 2015-09-27 ENCOUNTER — Inpatient Hospital Stay (HOSPITAL_COMMUNITY)
Admission: AD | Admit: 2015-09-27 | Discharge: 2015-09-27 | Disposition: A | Discharge status: Home / Self Care | Payer: Medicaid Other | Source: Ambulatory Visit | Attending: Obstetrics and Gynecology | Admitting: Obstetrics and Gynecology

## 2015-09-27 ENCOUNTER — Encounter (HOSPITAL_COMMUNITY): Payer: Self-pay | Admitting: *Deleted

## 2015-09-27 DIAGNOSIS — R112 Nausea with vomiting, unspecified: Secondary | ICD-10-CM | POA: Diagnosis present

## 2015-09-27 DIAGNOSIS — Z3A27 27 weeks gestation of pregnancy: Secondary | ICD-10-CM | POA: Insufficient documentation

## 2015-09-27 DIAGNOSIS — O210 Mild hyperemesis gravidarum: Secondary | ICD-10-CM | POA: Diagnosis not present

## 2015-09-27 LAB — URINALYSIS, ROUTINE W REFLEX MICROSCOPIC
BILIRUBIN URINE: NEGATIVE
Glucose, UA: NEGATIVE mg/dL
Hgb urine dipstick: NEGATIVE
Ketones, ur: >80 mg/dL — AB
Leukocytes, UA: NEGATIVE
Nitrite: NEGATIVE
PH: 8.5 — AB (ref 5.0–8.0)
Protein, ur: NEGATIVE mg/dL
Specific Gravity, Urine: 1.015 (ref 1.005–1.030)
Urobilinogen, UA: 0.2 mg/dL (ref 0.0–1.0)

## 2015-09-27 LAB — COMPREHENSIVE METABOLIC PANEL
ALT: 11 U/L — ABNORMAL LOW (ref 14–54)
ANION GAP: 5 (ref 5–15)
AST: 11 U/L — AB (ref 15–41)
Albumin: 3.1 g/dL — ABNORMAL LOW (ref 3.5–5.0)
Alkaline Phosphatase: 59 U/L (ref 38–126)
BILIRUBIN TOTAL: 0.6 mg/dL (ref 0.3–1.2)
BUN: 6 mg/dL (ref 6–20)
CHLORIDE: 105 mmol/L (ref 101–111)
CO2: 23 mmol/L (ref 22–32)
Calcium: 8.4 mg/dL — ABNORMAL LOW (ref 8.9–10.3)
Creatinine, Ser: 0.32 mg/dL — ABNORMAL LOW (ref 0.44–1.00)
Glucose, Bld: 147 mg/dL — ABNORMAL HIGH (ref 65–99)
POTASSIUM: 3.6 mmol/L (ref 3.5–5.1)
Sodium: 133 mmol/L — ABNORMAL LOW (ref 135–145)
TOTAL PROTEIN: 6.7 g/dL (ref 6.5–8.1)

## 2015-09-27 LAB — RAPID URINE DRUG SCREEN, HOSP PERFORMED
AMPHETAMINES: NOT DETECTED
BARBITURATES: NOT DETECTED
Benzodiazepines: NOT DETECTED
COCAINE: NOT DETECTED
Opiates: NOT DETECTED
TETRAHYDROCANNABINOL: POSITIVE — AB

## 2015-09-27 MED ORDER — RANITIDINE HCL 150 MG PO TABS: 150.0000 mg | ORAL_TABLET | Freq: Two times a day (BID) | ORAL | Status: DC

## 2015-09-27 MED ORDER — METOCLOPRAMIDE HCL 10 MG PO TABS: 10.0000 mg | ORAL_TABLET | Freq: Three times a day (TID) | ORAL | Status: DC

## 2015-09-27 MED ORDER — PROMETHAZINE HCL 25 MG PO TABS: 25.0000 mg | ORAL_TABLET | Freq: Every evening | ORAL | Status: DC | PRN

## 2015-09-27 MED ORDER — PROMETHAZINE HCL 25 MG/ML IJ SOLN
25.0000 mg | Freq: Once | INTRAMUSCULAR | Status: AC
  Administered 2015-09-27: 25 mg via INTRAVENOUS
  Filled 2015-09-27: qty 1

## 2015-09-27 MED ORDER — SODIUM CHLORIDE 0.9 % IV SOLN
8.0000 mg | Freq: Once | INTRAVENOUS | Status: AC
  Administered 2015-09-27: 8 mg via INTRAVENOUS
  Filled 2015-09-27: qty 4

## 2015-09-27 MED ORDER — ONDANSETRON 8 MG PO TBDP: 8.0000 mg | ORAL_TABLET | Freq: Three times a day (TID) | ORAL | Status: DC | PRN

## 2015-09-27 MED ORDER — LACTATED RINGERS IV BOLUS (SEPSIS)
1000.0000 mL | Freq: Once | INTRAVENOUS | Status: AC
  Administered 2015-09-27: 1000 mL via INTRAVENOUS

## 2015-09-27 MED ORDER — FAMOTIDINE IN NACL 20-0.9 MG/50ML-% IV SOLN
20.0000 mg | Freq: Once | INTRAVENOUS | Status: AC
  Administered 2015-09-27: 20 mg via INTRAVENOUS
  Filled 2015-09-27: qty 50

## 2015-09-27 MED ORDER — DEXTROSE 5 % IN LACTATED RINGERS IV BOLUS
1000.0000 mL | Freq: Once | INTRAVENOUS | Status: AC
  Administered 2015-09-27: 1000 mL via INTRAVENOUS

## 2015-09-27 NOTE — MAU Provider Note (Signed)
History     CSN: [id number]  Arrival date and time: 09/27/15 1712   None     Chief Complaint  Patient presents with  . Nausea  . Emesis   HPI   Ms.Caitlin Sullivan is a 24 y.o. female G3P1011 at [redacted]w[redacted]d presenting to MAU with nausea and vomiting. She has had on-going N/V and has been taking medication as prescribed. She has started care with [facility], however would like to switch practices at this time. She went to the [facility] for her last pregnancy and would like to go back.   In the last 24 hours she has vomited over 10 times. She has taken her medication today, however has not felt it working.   She denies vaginal bleeding Denies leaking of fluid, + fetal movements  OB History    Gravida Para Term Preterm AB TAB SAB Ectopic Multiple Living   Past Medical History  Diagnosis Date  . Umbilical hernia   . Hyperemesis arising during pregnancy   . Asthma   . Gall stones   . Iron deficiency anemia   . Heart murmur   . GERD (gastroesophageal reflux disease)     Past Surgical History  Procedure Laterality Date  . Pilonidal cyst / sinus excision    . Cholecystectomy    . Cholecystectomy N/A 09/22/2014    Procedure: LAPAROSCOPIC CHOLECYSTECTOMY;  Surgeon: Axel Filler, MD;  Location: [facility] OR;  Service: General;  Laterality: N/A;    Family History  Problem Relation Age of Onset  . Cancer Mother   . Hypertension Mother   . Diabetes Father   . Hypertension Father   . Hyperlipidemia Father   . Stroke Father   . Asthma Brother     Social History  Substance Use Topics  . Smoking status: Former Smoker -- 0.25 packs/day    Types: Cigarettes    Quit date: 09/16/2014  . Smokeless tobacco: Never Used  . Alcohol Use: No    Allergies: No Known Allergies  Prescriptions prior to admission  Medication Sig Dispense Refill Last Dose  . albuterol (PROVENTIL HFA;VENTOLIN HFA) 108 (90 BASE) MCG/ACT inhaler Inhale 1-2 puffs into the lungs every 6 (six)  hours as needed. For wheezing or shortness of breath   rescue  . famotidine (PEPCID) 20 MG tablet Take 1 tablet (20 mg total) by mouth 2 (two) times daily. 30 tablet 0   . ondansetron (ZOFRAN ODT) 8 MG disintegrating tablet Take 1 tablet (8 mg total) by mouth every 8 (eight) hours as needed for nausea or vomiting. 20 tablet 0   . Prenatal Vit-Fe Fumarate-FA (PRENATAL COMPLETE) 14-0.4 MG TABS Take 14 mg by mouth daily. (Patient not taking: Reported on 09/19/2015) 60 each 0 07/06/2015 at Unknown time  . promethazine (PHENERGAN) 25 MG tablet Take 1 tablet (25 mg total) by mouth every 6 (six) hours as needed for nausea or vomiting. 30 tablet 0    Results for orders placed or performed during the hospital encounter of 09/27/15 (from the past 48 hour(s))  Urinalysis, Routine w reflex microscopic (not at [facility])     Status: Abnormal   Collection Time: 09/27/15  5:34 PM  Result Value Ref Range   Color, Urine YELLOW YELLOW   APPearance CLEAR CLEAR   Specific Gravity, Urine 1.015 1.005 - 1.030   pH 8.5 (H) 5.0 - 8.0   Glucose, UA NEGATIVE NEGATIVE mg/dL   Hgb  urine dipstick NEGATIVE NEGATIVE   Bilirubin Urine NEGATIVE NEGATIVE   Ketones, ur >80 (A) NEGATIVE mg/dL   Protein, ur NEGATIVE NEGATIVE mg/dL   Urobilinogen, UA 0.2 0.0 - 1.0 mg/dL   Nitrite NEGATIVE NEGATIVE   Leukocytes, UA NEGATIVE NEGATIVE    Comment: MICROSCOPIC NOT DONE ON URINES WITH NEGATIVE PROTEIN, BLOOD, LEUKOCYTES, NITRITE, OR GLUCOSE <1000 mg/dL.  Urine rapid drug screen (hosp performed)     Status: Abnormal   Collection Time: 09/27/15  5:34 PM  Result Value Ref Range   Opiates NONE DETECTED NONE DETECTED   Cocaine NONE DETECTED NONE DETECTED   Benzodiazepines NONE DETECTED NONE DETECTED   Amphetamines NONE DETECTED NONE DETECTED   Tetrahydrocannabinol POSITIVE (A) NONE DETECTED   Barbiturates NONE DETECTED NONE DETECTED    Comment:        DRUG SCREEN FOR MEDICAL PURPOSES ONLY.  IF CONFIRMATION IS NEEDED FOR ANY PURPOSE,  NOTIFY LAB WITHIN 5 DAYS.        LOWEST DETECTABLE LIMITS FOR URINE DRUG SCREEN Drug Class       Cutoff (ng/mL) Amphetamine      1000 Barbiturate      200 Benzodiazepine   200 Tricyclics       300 Opiates          300 Cocaine          300 THC              50      Review of Systems  Constitutional: Negative for fever and chills.  Gastrointestinal: Positive for heartburn, nausea and vomiting.  Genitourinary: Negative for dysuria and urgency.   Physical Exam   Blood pressure 132/82, pulse 103, resp. rate 18, height  (1.676 m), weight 104.327 kg (230 lb), last menstrual period 03/20/2015.  Physical Exam  Constitutional: She is oriented to person, place, and time. She appears well-developed and well-nourished. No distress.  HENT:  Head: Normocephalic.  Cardiovascular: Normal rate and normal heart sounds.   Respiratory: Effort normal and breath sounds normal.  Musculoskeletal: Normal range of motion.  Neurological: She is alert and oriented to person, place, and time.  Skin: Skin is warm. She is not diaphoretic.  Psychiatric: Her behavior is normal.   Fetal Tracing: Baseline: 135 bpm  Variability: Moderate  Accelerations: 10x10 Decelerations:quick variables  Toco: None  MAU Course  Procedures  MDM  Zofran 8 mg  Phenergan 25 mg LR bolus Phenergan  CMP  Report given to Thressa Sheller CNM at 2000.   2117: Patient has had 2L of IVF. She reports feeling better at this time. She would like tRX for zofran as it works best for her to take phenergan and zofran. She is currently out of zofran. Will give new rx.   A:  Hyperemesis in third trimester.    P:  Discharge home in stable condition  Stop Pepcid, start zantac BID RX: new rx provided today Reglan TID        Phenergan at night        New rx provided today Zofran Q8 PO Small, frequent meals   Referral to the [facility] sent.    Tawnya Crook 9:18 PM 09/27/2015  Duane Lope,  NP 09/27/2015 8:04 PM

## 2015-09-27 NOTE — MAU Note (Signed)
Pt presents to MAU with complaints of nausea and vomiting since this morning around 5am. Denies any vaginal bleeding or discharge

## 2015-09-27 NOTE — Discharge Instructions (Signed)

## 2015-10-04 ENCOUNTER — Ambulatory Visit (INDEPENDENT_AMBULATORY_CARE_PROVIDER_SITE_OTHER): Payer: Medicaid Other | Admitting: Advanced Practice Midwife

## 2015-10-04 ENCOUNTER — Encounter: Payer: Self-pay | Admitting: *Deleted

## 2015-10-04 ENCOUNTER — Encounter: Payer: Self-pay | Admitting: Advanced Practice Midwife

## 2015-10-04 VITALS — BP 128/92 | HR 89 | Temp 97.9°F | Wt 235.8 lb

## 2015-10-04 DIAGNOSIS — Z23 Encounter for immunization: Secondary | ICD-10-CM | POA: Diagnosis not present

## 2015-10-04 DIAGNOSIS — O99323 Drug use complicating pregnancy, third trimester: Secondary | ICD-10-CM | POA: Diagnosis not present

## 2015-10-04 DIAGNOSIS — Z3492 Encounter for supervision of normal pregnancy, unspecified, second trimester: Secondary | ICD-10-CM

## 2015-10-04 DIAGNOSIS — F1290 Cannabis use, unspecified, uncomplicated: Secondary | ICD-10-CM

## 2015-10-04 DIAGNOSIS — F1210 Cannabis abuse, uncomplicated: Secondary | ICD-10-CM | POA: Diagnosis not present

## 2015-10-04 DIAGNOSIS — Z3493 Encounter for supervision of normal pregnancy, unspecified, third trimester: Secondary | ICD-10-CM

## 2015-10-04 DIAGNOSIS — Z3483 Encounter for supervision of other normal pregnancy, third trimester: Secondary | ICD-10-CM

## 2015-10-04 DIAGNOSIS — O99333 Smoking (tobacco) complicating pregnancy, third trimester: Secondary | ICD-10-CM | POA: Insufficient documentation

## 2015-10-04 DIAGNOSIS — O133 Gestational [pregnancy-induced] hypertension without significant proteinuria, third trimester: Secondary | ICD-10-CM

## 2015-10-04 DIAGNOSIS — O219 Vomiting of pregnancy, unspecified: Secondary | ICD-10-CM | POA: Diagnosis not present

## 2015-10-04 DIAGNOSIS — O210 Mild hyperemesis gravidarum: Secondary | ICD-10-CM

## 2015-10-04 LAB — POCT URINALYSIS DIP (DEVICE)
Bilirubin Urine: NEGATIVE
Glucose, UA: NEGATIVE mg/dL
HGB URINE DIPSTICK: NEGATIVE
KETONES UR: NEGATIVE mg/dL
LEUKOCYTES UA: NEGATIVE
Nitrite: NEGATIVE
Protein, ur: NEGATIVE mg/dL
SPECIFIC GRAVITY, URINE: 1.02 (ref 1.005–1.030)
UROBILINOGEN UA: 0.2 mg/dL (ref 0.0–1.0)
pH: 7 (ref 5.0–8.0)

## 2015-10-04 LAB — CBC
HEMATOCRIT: 33.5 % — AB (ref 36.0–46.0)
Hemoglobin: 11.2 g/dL — ABNORMAL LOW (ref 12.0–15.0)
MCH: 29 pg (ref 26.0–34.0)
MCHC: 33.4 g/dL (ref 30.0–36.0)
MCV: 86.8 fL (ref 78.0–100.0)
MPV: 10.9 fL (ref 8.6–12.4)
PLATELETS: 294 10*3/uL (ref 150–400)
RBC: 3.86 MIL/uL — AB (ref 3.87–5.11)
RDW: 13.8 % (ref 11.5–15.5)
WBC: 13.1 10*3/uL — ABNORMAL HIGH (ref 4.0–10.5)

## 2015-10-04 MED ORDER — TETANUS-DIPHTH-ACELL PERTUSSIS 5-2.5-18.5 LF-MCG/0.5 IM SUSP
0.5000 mL | Freq: Once | INTRAMUSCULAR | Status: AC
  Administered 2015-10-04: 0.5 mL via INTRAMUSCULAR

## 2015-10-04 MED ORDER — ONDANSETRON 8 MG PO TBDP: 8.0000 mg | ORAL_TABLET | Freq: Three times a day (TID) | ORAL | Status: DC | PRN

## 2015-10-04 MED ORDER — PROMETHAZINE HCL 25 MG PO TABS: 25.0000 mg | ORAL_TABLET | Freq: Every evening | ORAL | Status: DC | PRN

## 2015-10-04 NOTE — Progress Notes (Signed)
Initial and 28 week prenatal education packets given Breastfeeding tip of the week reviewed Tdap today, flu declined 1hr gtt and 28 week labs today

## 2015-10-04 NOTE — Progress Notes (Signed)
Subjective:  Caitlin Sullivan is a 24 y.o. G3P1011 at 6633w2d [facility] Records, US's, reviewed. Patient reports ongoing nausea and vomiting throughout pregnancy.  Contractions: Not present.  Vag. Bleeding: None. Movement: Present. Denies leaking of fluid.   The following portions of the patient's history were reviewed and updated as appropriate: allergies, current medications, past family history, past medical history, past social history, past surgical history and problem list. Problem list updated.  Objective:   Filed Vitals:   10/04/15 0944  BP: 128/92  Pulse: 89  Temp: 97.9 F (36.6 C)  Weight: 235 lb 12.8 oz (106.958 kg)    Fetal Status: Fetal Heart Rate (bpm): 140   Movement: Present     General:  Alert, oriented and cooperative. Patient is in no acute distress.  Skin: Skin is warm and dry. No rash noted.   Cardiovascular: Normal heart rate noted  Respiratory: Normal respiratory effort, no problems with respiration noted  Abdomen: Soft, gravid, appropriate for gestational age. Pain/Pressure: Present     Pelvic: Vag. Bleeding: None     Cervical exam deferred        Extremities: Normal range of motion.  Edema: None  Mental Status: Normal mood and affect. Normal behavior. Normal judgment and thought content.   Urinalysis:      Assessment and Plan:  Pregnancy: G3P1011 at 333w2d  1. Supervision of low-risk pregnancy, second trimester  - Glucose Tolerance, 1 HR (50g) w/o Fasting - CBC - RPR - HIV antibody (with reflex)  2. Need for Tdap vaccination  - Tdap (BOOSTRIX) injection 0.5 mL; Inject 0.5 mLs into the muscle once.  3. Supervision of normal pregnancy in third trimester   5. Marijuana use   6. Hyperemesis affecting pregnancy, antepartum  - ondansetron (ZOFRAN ODT) 8 MG disintegrating tablet; Take 1 tablet (8 mg total) by mouth every 8 (eight) hours as needed for nausea or vomiting.  Dispense: 20 tablet; Refill: 6 -  promethazine (PHENERGAN) 25 MG tablet; Take 1 tablet (25 mg total) by mouth at bedtime as needed for nausea or vomiting.  Dispense: 30 tablet; Refill: 6  Preterm labor symptoms and general obstetric precautions including but not limited to vaginal bleeding, contractions, leaking of fluid and fetal movement were reviewed in detail with the patient. Please refer to After Visit Summary for other counseling recommendations.  Encouraged pt to stop MJ use and second-hand exposure.  F/U 2 week  Dorathy KinsmanVirginia Xiomar Crompton, [place]

## 2015-10-04 NOTE — Patient Instructions (Addendum)
Preterm Labor Information Preterm labor is when labor starts at less than 37 weeks of pregnancy. The normal length of a pregnancy is 39 to 41 weeks. CAUSES Often, there is no identifiable underlying cause as to why a woman goes into preterm labor. One of the most common known causes of preterm labor is infection. Infections of the uterus, cervix, vagina, amniotic sac, bladder, kidney, or even the lungs (pneumonia) can cause labor to start. Other suspected causes of preterm labor include:   Urogenital infections, such as yeast infections and bacterial vaginosis.   Uterine abnormalities (uterine shape, uterine septum, fibroids, or bleeding from the placenta).   A cervix that has been operated on (it may fail to stay closed).   Malformations in the fetus.   Multiple gestations (twins, triplets, and so on).   Breakage of the amniotic sac.  RISK FACTORS  Having a previous history of preterm labor.   Having premature rupture of membranes (PROM).   Having a placenta that covers the opening of the cervix (placenta previa).   Having a placenta that separates from the uterus (placental abruption).   Having a cervix that is too weak to hold the fetus in the uterus (incompetent cervix).   Having too much fluid in the amniotic sac (polyhydramnios).   Taking illegal drugs or smoking while pregnant.   Not gaining enough weight while pregnant.   Being younger than 68 and older than 23 years old.   Having a low socioeconomic status.   Being African American. SYMPTOMS Signs and symptoms of preterm labor include:   Menstrual-like cramps, abdominal pain, or back pain.  Uterine contractions that are regular, as frequent as six in an hour, regardless of their intensity (may be mild or painful).  Contractions that start on the top of the uterus and spread down to the lower abdomen and back.   A sense of increased pelvic pressure.   A watery or bloody mucus discharge that  comes from the vagina.  TREATMENT Depending on the length of the pregnancy and other circumstances, your health care provider may suggest bed rest. If necessary, there are medicines that can be given to stop contractions and to mature the fetal lungs. If labor happens before 34 weeks of pregnancy, a prolonged hospital stay may be recommended. Treatment depends on the condition of both you and the fetus.  WHAT SHOULD YOU DO IF YOU THINK YOU ARE IN PRETERM LABOR? Call your health care provider right away. You will need to go to the hospital to get checked immediately. HOW CAN YOU PREVENT PRETERM LABOR IN FUTURE PREGNANCIES? You should:   Stop smoking if you smoke.  Maintain healthy weight gain and avoid chemicals and drugs that are not necessary.  Be watchful for any type of infection.  Inform your health care provider if you have a known history of preterm labor.   This information is not intended to replace advice given to you by your health care provider. Make sure you discuss any questions you have with your health care provider.   Document Released: 02/29/2004 Document Revised: 08/11/2013 Document Reviewed: 01/11/2013 Elsevier Interactive Patient Education 2016 [employer].  Smoking Cessation, Tips for Success If you are ready to quit smoking, congratulations! You have chosen to help yourself be healthier. Cigarettes bring nicotine, tar, carbon monoxide, and other irritants into your body. Your lungs, heart, and blood vessels will be able to work better without these poisons. There are many different ways to quit smoking. Nicotine gum, nicotine  patches, a nicotine inhaler, or nicotine nasal spray can help with physical craving. Hypnosis, support groups, and medicines help break the habit of smoking. WHAT THINGS CAN I DO TO MAKE QUITTING EASIER?  Here are some tips to help you quit for good: Pick a date when you will quit smoking completely. Tell all of your friends and family about  your plan to quit on that date. Do not try to slowly cut down on the number of cigarettes you are smoking. Pick a quit date and quit smoking completely starting on that day. Throw away all cigarettes.  Clean and remove all ashtrays from your home, work, and car. On a card, write down your reasons for quitting. Carry the card with you and read it when you get the urge to smoke. Cleanse your body of nicotine. Drink enough water and fluids to keep your urine clear or pale yellow. Do this after quitting to flush the nicotine from your body. Learn to predict your moods. Do not let a bad situation be your excuse to have a cigarette. Some situations in your life might tempt you into wanting a cigarette. Never have "just one" cigarette. It leads to wanting another and another. Remind yourself of your decision to quit. Change habits associated with smoking. If you smoked while driving or when feeling stressed, try other activities to replace smoking. Stand up when drinking your coffee. Brush your teeth after eating. Sit in a different chair when you read the paper. Avoid alcohol while trying to quit, and try to drink fewer caffeinated beverages. Alcohol and caffeine may urge you to smoke. Avoid foods and drinks that can trigger a desire to smoke, such as sugary or spicy foods and alcohol. Ask people who smoke not to smoke around you. Have something planned to do right after eating or having a cup of coffee. For example, plan to take a walk or exercise. Try a relaxation exercise to calm you down and decrease your stress. Remember, you may be tense and nervous for the first 2 weeks after you quit, but this will pass. Find new activities to keep your hands busy. Play with a pen, coin, or rubber band. Doodle or draw things on paper. Brush your teeth right after eating. This will help cut down on the craving for the taste of tobacco after meals. You can also try mouthwash.  Use oral substitutes in place of  cigarettes. Try using lemon drops, carrots, cinnamon sticks, or chewing gum. Keep them handy so they are available when you have the urge to smoke. When you have the urge to smoke, try deep breathing. Designate your home as a nonsmoking area. If you are a heavy smoker, ask your health care provider about a prescription for nicotine chewing gum. It can ease your withdrawal from nicotine. Reward yourself. Set aside the cigarette money you save and buy yourself something nice. Look for support from others. Join a support group or smoking cessation program. Ask someone at home or at work to help you with your plan to quit smoking. Always ask yourself, "Do I need this cigarette or is this just a reflex?" Tell yourself, "Today, I choose not to smoke," or "I do not want to smoke." You are reminding yourself of your decision to quit. Do not replace cigarette smoking with electronic cigarettes (commonly called e-cigarettes). The safety of e-cigarettes is unknown, and some may contain harmful chemicals. If you relapse, do not give up! Plan ahead and think about what you will  do the next time you get the urge to smoke. HOW WILL I FEEL WHEN I QUIT SMOKING? You may have symptoms of withdrawal because your body is used to nicotine (the addictive substance in cigarettes). You may crave cigarettes, be irritable, feel very hungry, cough often, get headaches, or have difficulty concentrating. The withdrawal symptoms are only temporary. They are strongest when you first quit but will go away within 10-14 days. When withdrawal symptoms occur, stay in control. Think about your reasons for quitting. Remind yourself that these are signs that your body is healing and getting used to being without cigarettes. Remember that withdrawal symptoms are easier to treat than the major diseases that smoking can cause.  Even after the withdrawal is over, expect periodic urges to smoke. However, these cravings are generally short lived and  will go away whether you smoke or not. Do not smoke! WHAT RESOURCES ARE AVAILABLE TO HELP ME QUIT SMOKING? Your health care provider can direct you to community resources or hospitals for support, which may include: Group support. Education. Hypnosis. Therapy.   This information is not intended to replace advice given to you by your health care provider. Make sure you discuss any questions you have with your health care provider.   Document Released: 09/06/2004 Document Revised: 12/30/2014 Document Reviewed: 05/27/2013 Elsevier Interactive Patient Education [employer]

## 2015-10-05 ENCOUNTER — Telehealth: Payer: Self-pay | Admitting: *Deleted

## 2015-10-05 ENCOUNTER — Other Ambulatory Visit: Payer: Self-pay | Admitting: Obstetrics and Gynecology

## 2015-10-05 LAB — RPR

## 2015-10-05 LAB — GLUCOSE TOLERANCE, 1 HOUR (50G) W/O FASTING: Glucose, 1 Hour GTT: 162 mg/dL — ABNORMAL HIGH (ref 70–140)

## 2015-10-05 LAB — HIV ANTIBODY (ROUTINE TESTING W REFLEX): HIV 1&2 Ab, 4th Generation: NONREACTIVE

## 2015-10-05 NOTE — Telephone Encounter (Signed)
Elevated 1 hour GTT, needs 3 hr. GTT.   Called Harriett Sineancy and left message I am calling with some information- non- urgent. Please call clinic.

## 2015-10-09 NOTE — Telephone Encounter (Signed)
Called Harriett Sineancy and a female answered and said Harriett Sineancy is not available. I asked him to tell her we are trying to reach her and to please clinic during office hours.

## 2015-10-10 NOTE — Telephone Encounter (Addendum)
Attempted to call again, no answer, voice mail left asking her to return our call at the clinic.  10/19  1622  Called pt and left message that it is very important for her to call back regarding test result information.  Please leave a message stating whether it is ok to leave details on her voice mail.  Diane Day RNC

## 2015-10-12 ENCOUNTER — Encounter: Payer: Self-pay | Admitting: *Deleted

## 2015-10-12 NOTE — Telephone Encounter (Signed)
Called patient and left message to call us back. Will also send certified letter.

## 2015-10-18 ENCOUNTER — Telehealth: Payer: Self-pay | Admitting: Advanced Practice Midwife

## 2015-10-18 ENCOUNTER — Encounter: Payer: Medicaid Other | Admitting: Family

## 2015-10-18 ENCOUNTER — Encounter: Payer: Medicaid Other | Admitting: Advanced Practice Midwife

## 2015-10-18 NOTE — Telephone Encounter (Signed)
Called patient about missed appointment. Left message to please call back and reschedule. Important

## 2015-10-25 ENCOUNTER — Ambulatory Visit (INDEPENDENT_AMBULATORY_CARE_PROVIDER_SITE_OTHER): Payer: Medicaid Other | Admitting: Certified Nurse Midwife

## 2015-10-25 VITALS — BP 120/63 | HR 89 | Temp 97.8°F | Wt 236.6 lb

## 2015-10-25 DIAGNOSIS — F1210 Cannabis abuse, uncomplicated: Secondary | ICD-10-CM | POA: Diagnosis not present

## 2015-10-25 DIAGNOSIS — Z3483 Encounter for supervision of other normal pregnancy, third trimester: Secondary | ICD-10-CM

## 2015-10-25 DIAGNOSIS — O99323 Drug use complicating pregnancy, third trimester: Secondary | ICD-10-CM | POA: Diagnosis not present

## 2015-10-25 DIAGNOSIS — O99810 Abnormal glucose complicating pregnancy: Secondary | ICD-10-CM

## 2015-10-25 LAB — POCT URINALYSIS DIP (DEVICE)
Bilirubin Urine: NEGATIVE
Glucose, UA: NEGATIVE mg/dL
HGB URINE DIPSTICK: NEGATIVE
Leukocytes, UA: NEGATIVE
Nitrite: NEGATIVE
PH: 7 (ref 5.0–8.0)
PROTEIN: NEGATIVE mg/dL
SPECIFIC GRAVITY, URINE: 1.02 (ref 1.005–1.030)
UROBILINOGEN UA: 0.2 mg/dL (ref 0.0–1.0)

## 2015-10-25 NOTE — Progress Notes (Signed)
3 hr GTT today 

## 2015-10-25 NOTE — Patient Instructions (Signed)

## 2015-10-25 NOTE — Addendum Note (Signed)
Addended by: Jill SideAY, DIANE L on: 10/25/2015 12:02 PM   Modules accepted: Orders

## 2015-10-25 NOTE — Progress Notes (Signed)
Subjective:  Caitlin Sullivan is a 24 y.o. G3P1011 at 5939w2d being seen today for ongoing prenatal care.  Patient reports no complaints. She is doing her 3 hour GCT today. Contractions: Not present.  Vag. Bleeding: None. Movement: Present. Denies leaking of fluid.   The following portions of the patient's history were reviewed and updated as appropriate: allergies, current medications, past family history, past medical history, past social history, past surgical history and problem list. Problem list updated.  Objective:   Filed Vitals:   10/25/15 0850  BP: 120/63  Pulse: 89  Temp: 97.8 F (36.6 C)  Weight: 236 lb 9.6 oz (107.321 kg)    Fetal Status: Fetal Heart Rate (bpm): 136   Movement: Present     General:  Alert, oriented and cooperative. Patient is in no acute distress.  Skin: Skin is warm and dry. No rash noted.   Cardiovascular: Normal heart rate noted  Respiratory: Normal respiratory effort, no problems with respiration noted  Abdomen: Soft, gravid, appropriate for gestational age. Pain/Pressure: Present     Pelvic: Vag. Bleeding: None     Cervical exam deferred        Extremities: Normal range of motion.  Edema: Trace  Mental Status: Normal mood and affect. Normal behavior. Normal judgment and thought content.   Urinalysis:      Assessment and Plan:  Pregnancy: G3P1011 at [redacted]w[redacted]d  There are no diagnoses linked to this encounter. Preterm labor symptoms and general obstetric precautions including but not limited to vaginal bleeding, contractions, leaking of fluid and fetal movement were reviewed in detail with the patient. Please refer to After Visit Summary for other counseling recommendations.  Return in about 2 weeks (around 11/08/2015).   Rhea PinkLori A Ianmichael Amescua, CNM

## 2015-10-30 LAB — GLUCOSE TOLERANCE, 3 HOURS
Glucose Tolerance, 1 hour: 182 mg/dL (ref 70–189)
Glucose Tolerance, 2 hour: 93 mg/dL (ref 70–164)
Glucose Tolerance, Fasting: 89 mg/dL (ref 65–99)
Glucose, GTT - 3 Hour: 104 mg/dL (ref 70–144)

## 2015-11-08 ENCOUNTER — Encounter: Payer: Medicaid Other | Admitting: Advanced Practice Midwife

## 2015-11-08 ENCOUNTER — Encounter: Payer: Self-pay | Admitting: Advanced Practice Midwife

## 2015-11-13 ENCOUNTER — Telehealth: Payer: Self-pay

## 2015-11-13 NOTE — Telephone Encounter (Signed)
Pt request results of 3hr.  Called pt and mother answered informing me that pt does not have phone.  Pt mother stated that she will let pt know to return our call.

## 2015-11-14 ENCOUNTER — Encounter (HOSPITAL_COMMUNITY): Payer: Self-pay | Admitting: Emergency Medicine

## 2015-11-14 ENCOUNTER — Emergency Department (HOSPITAL_COMMUNITY)
Admission: EM | Admit: 2015-11-14 | Discharge: 2015-11-14 | Disposition: A | Discharge status: Home / Self Care | Payer: Medicaid Other | Source: Home / Self Care

## 2015-11-14 DIAGNOSIS — B029 Zoster without complications: Secondary | ICD-10-CM | POA: Diagnosis not present

## 2015-11-14 MED ORDER — ACYCLOVIR 400 MG PO TABS: 400.0000 mg | ORAL_TABLET | Freq: Four times a day (QID) | ORAL | Status: DC

## 2015-11-14 NOTE — Telephone Encounter (Signed)
Called pt and informed her of normal 3hr GTT results.  Pt confirmed understanding and stated that she has another concern. She reports that 2 days ago she noticed what looked like a spider bite on her arm. The area became red and has seemed to spread and become worse. Now the area has blisters with a clear fluid in them and the area has a burning sensation. The area is now about 2-3 inches in diameter. After consult w/Lisa Courtney ParisLeftwich Kirby, CNM I advised pt to go to Urgent Care for evaluation ASAP.  Pt voiced understanding and agreed to advice given.

## 2015-11-14 NOTE — Discharge Instructions (Signed)
Shingles Shingles is an infection that causes a painful skin rash and fluid-filled blisters. Shingles is caused by the same virus that causes chickenpox. Shingles only develops in people who:  Have had chickenpox.  Have gotten the chickenpox vaccine. (This is rare.) The first symptoms of shingles may be itching, tingling, or pain in an area on your skin. A rash will follow in a few days or weeks. The rash is usually on one side of the body in a bandlike or beltlike pattern. Over time, the rash turns into fluid-filled blisters that break open, scab over, and dry up. Medicines may:  Help you manage pain.  Help you recover more quickly.  Help to prevent long-term problems. HOME CARE Medicines  Take medicines only as told by your doctor.  Apply an anti-itch or numbing cream to the affected area as told by your doctor. Blister and Rash Care  Take a cool bath or put cool compresses on the area of the rash or blisters as told by your doctor. This may help with pain and itching.  Keep your rash covered with a loose bandage (dressing). Wear loose-fitting clothing.  Keep your rash and blisters clean with mild soap and cool water or as told by your doctor.  Check your rash every day for signs of infection. These include redness, swelling, and pain that lasts or gets worse.  Do not pick your blisters.  Do not scratch your rash. General Instructions  Rest as told by your doctor.  Keep all follow-up visits as told by your doctor. This is important.  Until your blisters scab over, your infection can cause chickenpox in people who have never had it or been vaccinated against it. To prevent this from happening, avoid touching other people or being around other people, especially:  Babies.  Pregnant women.  Children who have eczema.  Elderly people who have transplants.  People who have chronic illnesses, such as leukemia or AIDS. GET HELP IF:  Your pain does not get better with  medicine.  Your pain does not get better after the rash heals.  Your rash looks infected. Signs of infection include:  Redness.  Swelling.  Pain that lasts or gets worse. GET HELP RIGHT AWAY IF:  The rash is on your face or nose.  You have pain in your face, pain around your eye area, or loss of feeling on one side of your face.  You have ear pain or you have ringing in your ear.  You have loss of taste.  Your condition gets worse.   This information is not intended to replace advice given to you by your health care provider. Make sure you discuss any questions you have with your health care provider.   Document Released: 05/27/2008 Document Revised: 12/30/2014 Document Reviewed: 09/20/2014 Elsevier Interactive Patient Education 2016 [employer] Inc[employer]  

## 2015-11-14 NOTE — ED Notes (Signed)
Pt here with possible insect bite to right forearm Unsure of what insect States the pain started off with burning sensation and has now progressed with irritation, itching and spreading Small, raised bumps seen to arm and R hand  Tried aloe and cortisone

## 2015-11-21 ENCOUNTER — Encounter: Payer: Medicaid Other | Admitting: Certified Nurse Midwife

## 2015-11-21 ENCOUNTER — Ambulatory Visit (INDEPENDENT_AMBULATORY_CARE_PROVIDER_SITE_OTHER): Payer: Medicaid Other | Admitting: Student

## 2015-11-21 VITALS — BP 111/76 | HR 109 | Temp 98.0°F | Wt 236.6 lb

## 2015-11-21 DIAGNOSIS — O26893 Other specified pregnancy related conditions, third trimester: Secondary | ICD-10-CM

## 2015-11-21 DIAGNOSIS — Z3493 Encounter for supervision of normal pregnancy, unspecified, third trimester: Secondary | ICD-10-CM

## 2015-11-21 DIAGNOSIS — O99323 Drug use complicating pregnancy, third trimester: Secondary | ICD-10-CM | POA: Diagnosis not present

## 2015-11-21 DIAGNOSIS — F1210 Cannabis abuse, uncomplicated: Secondary | ICD-10-CM

## 2015-11-21 DIAGNOSIS — Z3483 Encounter for supervision of other normal pregnancy, third trimester: Secondary | ICD-10-CM | POA: Diagnosis present

## 2015-11-21 DIAGNOSIS — Z113 Encounter for screening for infections with a predominantly sexual mode of transmission: Secondary | ICD-10-CM

## 2015-11-21 DIAGNOSIS — N898 Other specified noninflammatory disorders of vagina: Secondary | ICD-10-CM

## 2015-11-21 LAB — POCT URINALYSIS DIP (DEVICE)
Bilirubin Urine: NEGATIVE
Glucose, UA: NEGATIVE mg/dL
Hgb urine dipstick: NEGATIVE
Ketones, ur: 40 mg/dL — AB
Nitrite: NEGATIVE
Protein, ur: 30 mg/dL — AB
Specific Gravity, Urine: 1.02 (ref 1.005–1.030)
Urobilinogen, UA: 0.2 mg/dL (ref 0.0–1.0)
pH: 7 (ref 5.0–8.0)

## 2015-11-21 LAB — WET PREP, GENITAL
TRICH WET PREP: NONE SEEN
YEAST WET PREP: NONE SEEN

## 2015-11-21 NOTE — Progress Notes (Signed)
Urine: 40mg  ketones Breastfeeding tip of the week reviewed Cultures today

## 2015-11-21 NOTE — Patient Instructions (Signed)
Fetal Movement Counts Patient Name: __________________________________________________ Patient Due Date: ____________________ Performing a fetal movement count is highly recommended in high-risk pregnancies, but it is good for every pregnant woman to do. Your health care provider may ask you to start counting fetal movements at 28 weeks of the pregnancy. Fetal movements often increase:  After eating a full meal.  After physical activity.  After eating or drinking something sweet or cold.  At rest. Pay attention to when you feel the baby is most active. This will help you notice a pattern of your baby's sleep and wake cycles and what factors contribute to an increase in fetal movement. It is important to perform a fetal movement count at the same time each day when your baby is normally most active.  HOW TO COUNT FETAL MOVEMENTS 1. Find a quiet and comfortable area to sit or lie down on your left side. Lying on your left side provides the best blood and oxygen circulation to your baby. 2. Write down the day and time on a sheet of paper or in a journal. 3. Start counting kicks, flutters, swishes, rolls, or jabs in a 2-hour period. You should feel at least 10 movements within 2 hours. 4. If you do not feel 10 movements in 2 hours, wait 2-3 hours and count again. Look for a change in the pattern or not enough counts in 2 hours. SEEK MEDICAL CARE IF:  You feel less than 10 counts in 2 hours, tried twice.  There is no movement in over an hour.  The pattern is changing or taking longer each day to reach 10 counts in 2 hours.  You feel the baby is not moving as he or she usually does. Date: ____________ Movements: ____________ Start time: ____________ Finish time: ____________  Date: ____________ Movements: ____________ Start time: ____________ Finish time: ____________ Date: ____________ Movements: ____________ Start time: ____________ Finish time: ____________ Date: ____________ Movements:  ____________ Start time: ____________ Finish time: ____________ Date: ____________ Movements: ____________ Start time: ____________ Finish time: ____________ Date: ____________ Movements: ____________ Start time: ____________ Finish time: ____________ Date: ____________ Movements: ____________ Start time: ____________ Finish time: ____________ Date: ____________ Movements: ____________ Start time: ____________ Finish time: ____________  Date: ____________ Movements: ____________ Start time: ____________ Finish time: ____________ Date: ____________ Movements: ____________ Start time: ____________ Finish time: ____________ Date: ____________ Movements: ____________ Start time: ____________ Finish time: ____________ Date: ____________ Movements: ____________ Start time: ____________ Finish time: ____________ Date: ____________ Movements: ____________ Start time: ____________ Finish time: ____________ Date: ____________ Movements: ____________ Start time: ____________ Finish time: ____________ Date: ____________ Movements: ____________ Start time: ____________ Finish time: ____________  Date: ____________ Movements: ____________ Start time: ____________ Finish time: ____________ Date: ____________ Movements: ____________ Start time: ____________ Finish time: ____________ Date: ____________ Movements: ____________ Start time: ____________ Finish time: ____________ Date: ____________ Movements: ____________ Start time: ____________ Finish time: ____________ Date: ____________ Movements: ____________ Start time: ____________ Finish time: ____________ Date: ____________ Movements: ____________ Start time: ____________ Finish time: ____________ Date: ____________ Movements: ____________ Start time: ____________ Finish time: ____________  Date: ____________ Movements: ____________ Start time: ____________ Finish time: ____________ Date: ____________ Movements: ____________ Start time: ____________ Finish  time: ____________ Date: ____________ Movements: ____________ Start time: ____________ Finish time: ____________ Date: ____________ Movements: ____________ Start time: ____________ Finish time: ____________ Date: ____________ Movements: ____________ Start time: ____________ Finish time: ____________ Date: ____________ Movements: ____________ Start time: ____________ Finish time: ____________ Date: ____________ Movements: ____________ Start time: ____________ Finish time: ____________  Date: ____________ Movements: ____________ Start time: ____________ Finish   time: ____________ Date: ____________ Movements: ____________ Start time: ____________ Finish time: ____________ Date: ____________ Movements: ____________ Start time: ____________ Finish time: ____________ Date: ____________ Movements: ____________ Start time: ____________ Finish time: ____________ Date: ____________ Movements: ____________ Start time: ____________ Finish time: ____________ Date: ____________ Movements: ____________ Start time: ____________ Finish time: ____________ Date: ____________ Movements: ____________ Start time: ____________ Finish time: ____________  Date: ____________ Movements: ____________ Start time: ____________ Finish time: ____________ Date: ____________ Movements: ____________ Start time: ____________ Finish time: ____________ Date: ____________ Movements: ____________ Start time: ____________ Finish time: ____________ Date: ____________ Movements: ____________ Start time: ____________ Finish time: ____________ Date: ____________ Movements: ____________ Start time: ____________ Finish time: ____________ Date: ____________ Movements: ____________ Start time: ____________ Finish time: ____________ Date: ____________ Movements: ____________ Start time: ____________ Finish time: ____________  Date: ____________ Movements: ____________ Start time: ____________ Finish time: ____________ Date: ____________  Movements: ____________ Start time: ____________ Finish time: ____________ Date: ____________ Movements: ____________ Start time: ____________ Finish time: ____________ Date: ____________ Movements: ____________ Start time: ____________ Finish time: ____________ Date: ____________ Movements: ____________ Start time: ____________ Finish time: ____________ Date: ____________ Movements: ____________ Start time: ____________ Finish time: ____________ Date: ____________ Movements: ____________ Start time: ____________ Finish time: ____________  Date: ____________ Movements: ____________ Start time: ____________ Finish time: ____________ Date: ____________ Movements: ____________ Start time: ____________ Finish time: ____________ Date: ____________ Movements: ____________ Start time: ____________ Finish time: ____________ Date: ____________ Movements: ____________ Start time: ____________ Finish time: ____________ Date: ____________ Movements: ____________ Start time: ____________ Finish time: ____________ Date: ____________ Movements: ____________ Start time: ____________ Finish time: ____________   This information is not intended to replace advice given to you by your health care provider. Make sure you discuss any questions you have with your health care provider.   Document Released: 01/08/2007 Document Revised: 12/30/2014 Document Reviewed: 10/05/2012 Elsevier Interactive Patient Education 2016 Elsevier Inc. Preterm Labor Information Preterm labor is when labor starts at less than 37 weeks of pregnancy. The normal length of a pregnancy is 39 to 41 weeks. CAUSES Often, there is no identifiable underlying cause as to why a woman goes into preterm labor. One of the most common known causes of preterm labor is infection. Infections of the uterus, cervix, vagina, amniotic sac, bladder, kidney, or even the lungs (pneumonia) can cause labor to start. Other suspected causes of preterm labor include:    Urogenital infections, such as yeast infections and bacterial vaginosis.   Uterine abnormalities (uterine shape, uterine septum, fibroids, or bleeding from the placenta).   A cervix that has been operated on (it may fail to stay closed).   Malformations in the fetus.   Multiple gestations (twins, triplets, and so on).   Breakage of the amniotic sac.  RISK FACTORS 5. Having a previous history of preterm labor.  6. Having premature rupture of membranes (PROM).  7. Having a placenta that covers the opening of the cervix (placenta previa).  8. Having a placenta that separates from the uterus (placental abruption).  9. Having a cervix that is too weak to hold the fetus in the uterus (incompetent cervix).  10. Having too much fluid in the amniotic sac (polyhydramnios).  11. Taking illegal drugs or smoking while pregnant.  12. Not gaining enough weight while pregnant.  13. Being younger than 18 and older than 23 years old.  14. Having a low socioeconomic status.  15. Being African American. SYMPTOMS Signs and symptoms of preterm labor include:   Menstrual-like cramps, abdominal pain, or back pain.  Uterine contractions that are regular, as   frequent as six in an hour, regardless of their intensity (may be mild or painful).  Contractions that start on the top of the uterus and spread down to the lower abdomen and back.   A sense of increased pelvic pressure.   A watery or bloody mucus discharge that comes from the vagina.  TREATMENT Depending on the length of the pregnancy and other circumstances, your health care provider may suggest bed rest. If necessary, there are medicines that can be given to stop contractions and to mature the fetal lungs. If labor happens before 34 weeks of pregnancy, a prolonged hospital stay may be recommended. Treatment depends on the condition of both you and the fetus.  WHAT SHOULD YOU DO IF YOU THINK YOU ARE IN PRETERM LABOR? Call  your health care provider right away. You will need to go to the hospital to get checked immediately. HOW CAN YOU PREVENT PRETERM LABOR IN FUTURE PREGNANCIES? You should:   Stop smoking if you smoke.  Maintain healthy weight gain and avoid chemicals and drugs that are not necessary.  Be watchful for any type of infection.  Inform your health care provider if you have a known history of preterm labor.   This information is not intended to replace advice given to you by your health care provider. Make sure you discuss any questions you have with your health care provider.   Document Released: 02/29/2004 Document Revised: 08/11/2013 Document Reviewed: 01/11/2013 Elsevier Interactive Patient Education 2016 Elsevier Inc.  

## 2015-11-21 NOTE — Progress Notes (Signed)
Subjective:  Caitlin Sullivan is a 24 y.o. G3P1011 at 1963w1d being seen today for ongoing prenatal care.  She is currently monitored for the following issues for this low-risk pregnancy and has Hyperemesis gravidarum; Asthma; Cholelithiasis complicating pregnancy in third trimester, antepartum; Sudden onset of severe abdominal pain; Supervision of normal pregnancy in third trimester; Tobacco smoking affecting pregnancy in third trimester, antepartum; and Marijuana use on her problem list.  Patient reports lower abdominal pressure & inner thigh pain.  Contractions: Irritability. Vag. Bleeding: None.  Movement: Present. Denies leaking of fluid.   The following portions of the patient's history were reviewed and updated as appropriate: allergies, current medications, past family history, past medical history, past social history, past surgical history and problem list. Problem list updated.  Objective:   Filed Vitals:   11/21/15 1524  BP: 111/76  Pulse: 109  Temp: 98 F (36.7 C)  Weight: 236 lb 9.6 oz (107.321 kg)    Fetal Status: Fetal Heart Rate (bpm): 146   Movement: Present     General:  Alert, oriented and cooperative. Patient is in no acute distress.  Skin: Skin is warm and dry. No rash noted.   Cardiovascular: Normal heart rate noted  Respiratory: Normal respiratory effort, no problems with respiration noted  Abdomen: Soft, gravid, appropriate for gestational age. Pain/Pressure: Present     Pelvic: Vag. Bleeding: None Vag D/C Character: Mucous   Cervical exam performed         Dilation: Closed Effacement (%): 0 Station: -3   Extremities: Normal range of motion.  Edema: Trace  Mental Status: Normal mood and affect. Normal behavior. Normal judgment and thought content.   Urinalysis: Urine Protein: Trace Urine Glucose: Negative  Assessment and Plan:  Pregnancy: G3P1011 at 8163w1d  1. Supervision of normal pregnancy in third trimester  - Urine cytology ancillary only  Preterm  labor symptoms and general obstetric precautions including but not limited to vaginal bleeding, contractions, leaking of fluid and fetal movement were reviewed in detail with the patient. Please refer to After Visit Summary for other counseling recommendations.  Return in about 1 week (around 11/28/2015).   Judeth HornErin Almendra Loria, NP

## 2015-11-22 ENCOUNTER — Other Ambulatory Visit: Payer: Self-pay | Admitting: Student

## 2015-11-22 DIAGNOSIS — B9689 Other specified bacterial agents as the cause of diseases classified elsewhere: Secondary | ICD-10-CM

## 2015-11-22 DIAGNOSIS — N760 Acute vaginitis: Principal | ICD-10-CM

## 2015-11-22 MED ORDER — METRONIDAZOLE 500 MG PO TABS: 500.0000 mg | ORAL_TABLET | Freq: Two times a day (BID) | ORAL | Status: DC

## 2015-11-23 ENCOUNTER — Telehealth: Payer: Self-pay

## 2015-11-23 NOTE — Telephone Encounter (Signed)
Per Judeth HornErin Lawrence, pt has BV and Rx sent to pharmacy.  Called pt and LM to return call.

## 2015-11-28 NOTE — Telephone Encounter (Signed)
Called pt and left message requesting a call back and to let us know whether a detailed message can be left on her voice mail.

## 2015-11-29 ENCOUNTER — Encounter: Payer: Medicaid Other | Admitting: Advanced Practice Midwife

## 2015-11-29 NOTE — Telephone Encounter (Signed)
Called patient and left message to call us back. Per chart review patient has appointment today. She will be notified at her appointment.

## 2015-12-08 ENCOUNTER — Inpatient Hospital Stay (HOSPITAL_COMMUNITY)
Admission: AD | Admit: 2015-12-08 | Discharge: 2015-12-08 | Disposition: A | Discharge status: Home / Self Care | Payer: Medicaid Other | Source: Ambulatory Visit | Attending: Obstetrics & Gynecology | Admitting: Obstetrics & Gynecology

## 2015-12-08 ENCOUNTER — Encounter (HOSPITAL_COMMUNITY): Payer: Self-pay | Admitting: *Deleted

## 2015-12-08 DIAGNOSIS — Z87891 Personal history of nicotine dependence: Secondary | ICD-10-CM | POA: Diagnosis not present

## 2015-12-08 DIAGNOSIS — O212 Late vomiting of pregnancy: Secondary | ICD-10-CM | POA: Diagnosis present

## 2015-12-08 DIAGNOSIS — Z3A37 37 weeks gestation of pregnancy: Secondary | ICD-10-CM | POA: Diagnosis not present

## 2015-12-08 DIAGNOSIS — O210 Mild hyperemesis gravidarum: Secondary | ICD-10-CM

## 2015-12-08 DIAGNOSIS — O219 Vomiting of pregnancy, unspecified: Secondary | ICD-10-CM | POA: Diagnosis not present

## 2015-12-08 LAB — URINALYSIS, ROUTINE W REFLEX MICROSCOPIC
Glucose, UA: NEGATIVE mg/dL
Ketones, ur: NEGATIVE mg/dL
NITRITE: NEGATIVE
PH: 6.5 (ref 5.0–8.0)
Protein, ur: 30 mg/dL — AB
SPECIFIC GRAVITY, URINE: 1.025 (ref 1.005–1.030)

## 2015-12-08 LAB — URINE MICROSCOPIC-ADD ON

## 2015-12-08 MED ORDER — ONDANSETRON 8 MG PO TBDP: 8.0000 mg | ORAL_TABLET | Freq: Three times a day (TID) | ORAL | Status: DC | PRN

## 2015-12-08 MED ORDER — ONDANSETRON 4 MG PO TBDP
4.0000 mg | ORAL_TABLET | Freq: Once | ORAL | Status: AC
  Administered 2015-12-08: 4 mg via ORAL
  Filled 2015-12-08: qty 1

## 2015-12-08 NOTE — MAU Note (Signed)
Vomiting since yesterday.  Recently ran out of zofran.  Did not call office.

## 2015-12-08 NOTE — Discharge Instructions (Signed)
Eating Plan for Hyperemesis Gravidarum °Severe cases of hyperemesis gravidarum can lead to dehydration and malnutrition. The hyperemesis eating plan is one way to lessen the symptoms of nausea and vomiting. It is often used with prescribed medicines to control your symptoms.  °WHAT CAN I DO TO RELIEVE MY SYMPTOMS? °Listen to your body. Everyone is different and has different preferences. Find what works best for you. Some of the following things may help: °· Eat and drink slowly. °· Eat 5-6 small meals daily instead of 3 large meals.   °· Eat crackers before you get out of bed in the morning.   °· Starchy foods are usually well tolerated (such as cereal, toast, bread, potatoes, pasta, rice, and pretzels).   °· Ginger may help with nausea. Add ¼ tsp ground ginger to hot tea or choose ginger tea.   °· Try drinking 100% fruit juice or an electrolyte drink. °· Continue to take your prenatal vitamins as directed by your health care provider. If you are having trouble taking your prenatal vitamins, talk with your health care provider about different options. °· Include at least 1 serving of protein with your meals and snacks (such as meats or poultry, beans, nuts, eggs, or yogurt). Try eating a protein-rich snack before bed (such as cheese and crackers or a half turkey or peanut butter sandwich). °WHAT THINGS SHOULD I AVOID TO REDUCE MY SYMPTOMS? °The following things may help reduce your symptoms: °· Avoid foods with strong smells. Try eating meals in well-ventilated areas that are free of odors. °· Avoid drinking water or other beverages with meals. Try not to drink anything less than 30 minutes before and after meals. °· Avoid drinking more than 1 cup of fluid at a time. °· Avoid fried or high-fat foods, such as butter and cream sauces. °· Avoid spicy foods. °· Avoid skipping meals the best you can. Nausea can be more intense on an empty stomach. If you cannot tolerate food at that time, do not force it. Try sucking on  ice chips or other frozen items and make up the calories later. °· Avoid lying down within 2 hours after eating. °  °This information is not intended to replace advice given to you by your health care provider. Make sure you discuss any questions you have with your health care provider. °  °Document Released: 10/06/2007 Document Revised: 12/14/2013 Document Reviewed: 10/13/2013 °Elsevier Interactive Patient Education ©2016 Elsevier Inc. ° °Morning Sickness °Morning sickness is when you feel sick to your stomach (nauseous) during pregnancy. You may feel sick to your stomach and throw up (vomit). You may feel sick in the morning, but you can feel this way any time of day. Some women feel very sick to their stomach and cannot stop throwing up (hyperemesis gravidarum). °HOME CARE °· Only take medicines as told by your doctor. °· Take multivitamins as told by your doctor. Taking multivitamins before getting pregnant can stop or lessen the harshness of morning sickness. °· Eat dry toast or unsalted crackers before getting out of bed. °· Eat 5 to 6 small meals a day. °· Eat dry and bland foods like rice and baked potatoes. °· Do not drink liquids with meals. Drink between meals. °· Do not eat greasy, fatty, or spicy foods. °· Have someone cook for you if the smell of food causes you to feel sick or throw up. °· If you feel sick to your stomach after taking prenatal vitamins, take them at night or with a snack. °· Eat protein when   you need a snack (nuts, yogurt, cheese).  Eat unsweetened gelatins for dessert.  Wear a bracelet used for sea sickness (acupressure wristband).  Go to a doctor that puts thin needles into certain body points (acupuncture) to improve how you feel.  Do not smoke.  Use a humidifier to keep the air in your house free of odors.  Get lots of fresh air. GET HELP IF:  You need medicine to feel better.  You feel dizzy or lightheaded.  You are losing weight. GET HELP RIGHT AWAY IF:    You feel very sick to your stomach and cannot stop throwing up.  You pass out (faint). MAKE SURE YOU:  Understand these instructions.  Will watch your condition.  Will get help right away if you are not doing well or get worse.   This information is not intended to replace advice given to you by your health care provider. Make sure you discuss any questions you have with your health care provider.   Document Released: 01/16/2005 Document Revised: 12/30/2014 Document Reviewed: 05/26/2013 Elsevier Interactive Patient Education [employer]2016 Elsevier Inc.

## 2015-12-08 NOTE — MAU Provider Note (Signed)
History     CSN: [id number]  Arrival date and time: 12/08/15 1207   First Provider Initiated Contact with Patient 12/08/15 1301      Chief Complaint  Patient presents with  . Emesis During Pregnancy   HPI [name] is a 24 y.o. G3P1011 at 6838w4d who presents to MAU today with complaint of nausea and vomiting. Patient states that she has been taking Zofran throughout her pregnancy and recently ran out. She has not taken for 2 days. She has been having severe N/V for 2 days. She denies abdominal pain, regular contractions, vaginal bleeding, LOF or diarrhea. She reports good fetal movement.    OB History    Gravida Para Term Preterm AB TAB SAB Ectopic Multiple Living   3 1 1  1 1    1       Past Medical History  Diagnosis Date  . Umbilical hernia   . Hyperemesis arising during pregnancy   . Asthma   . Gall stones   . Iron deficiency anemia   . Heart murmur   . GERD (gastroesophageal reflux disease)     Past Surgical History  Procedure Laterality Date  . Pilonidal cyst / sinus excision    . Cholecystectomy    . Cholecystectomy N/A 09/22/2014    Procedure: LAPAROSCOPIC CHOLECYSTECTOMY;  Surgeon: Axel FillerArmando Ramirez, MD;  [facility];  Service: General;  Laterality: N/A;    Family History  Problem Relation Age of Onset  . Cancer Mother   . Hypertension Mother   . Diabetes Father   . Hypertension Father   . Hyperlipidemia Father   . Stroke Father   . Asthma Brother   . Multiple sclerosis Sister     Social History  Substance Use Topics  . Smoking status: Former Smoker -- 0.25 packs/day    Types: Cigarettes    Quit date: 09/16/2014  . Smokeless tobacco: Never Used  . Alcohol Use: No    Allergies: No Known Allergies  Prescriptions prior to admission  Medication Sig Dispense Refill Last Dose  . acyclovir (ZOVIRAX) 400 MG tablet Take 1 tablet (400 mg total) by mouth 4 (four) times daily. 28 tablet 0 Past Week at Unknown time  . omeprazole (PRILOSEC) 20 MG  capsule Take 20 mg by mouth daily.   12/07/2015 at Unknown time  . Prenatal Vit-Fe Fumarate-FA (PRENATAL COMPLETE) 14-0.4 MG TABS Take 14 mg by mouth daily. 60 each 0 12/08/2015 at Unknown time  . promethazine (PHENERGAN) 25 MG tablet Take 1 tablet (25 mg total) by mouth at bedtime as needed for nausea or vomiting. 30 tablet 6 12/08/2015 at Unknown time  . [DISCONTINUED] ondansetron (ZOFRAN ODT) 8 MG disintegrating tablet Take 1 tablet (8 mg total) by mouth every 8 (eight) hours as needed for nausea or vomiting. 20 tablet 6 12/08/2015 at Unknown time  . albuterol (PROVENTIL HFA;VENTOLIN HFA) 108 (90 BASE) MCG/ACT inhaler Inhale 1-2 puffs into the lungs every 6 (six) hours as needed. For wheezing or shortness of breath   Taking  . metoCLOPramide (REGLAN) 10 MG tablet Take 1 tablet (10 mg total) by mouth 3 (three) times daily before meals. (Patient not taking: Reported on 10/25/2015) 30 tablet 0 Not Taking  . metroNIDAZOLE (FLAGYL) 500 MG tablet Take 1 tablet (500 mg total) by mouth 2 (two) times daily. (Patient not taking: Reported on 12/08/2015) 14 tablet 0   . ranitidine (ZANTAC) 150 MG tablet Take 1 tablet (150 mg total) by mouth 2 (two) times daily. (Patient  not taking: Reported on 12/08/2015) 60 tablet 1 Taking    Review of Systems  Constitutional: Negative for fever and malaise/fatigue.  Gastrointestinal: Positive for nausea and vomiting. Negative for abdominal pain and diarrhea.  Genitourinary:       Neg - vaginal bleeding, discharge, LOF   Physical Exam   Blood pressure 126/80, pulse 91, temperature 97.6 F (36.4 C), temperature source Oral, resp. rate 16, last menstrual period 03/20/2015.  Physical Exam  Nursing note and vitals reviewed. Constitutional: She is oriented to person, place, and time. She appears well-developed and well-nourished. No distress.  HENT:  Head: Normocephalic and atraumatic.  Cardiovascular: Normal rate.   Respiratory: Effort normal.  GI: Soft. She exhibits  no distension.  Neurological: She is alert and oriented to person, place, and time.  Skin: Skin is warm and dry. No erythema.  Psychiatric: She has a normal mood and affect.    Results for orders placed or performed during the hospital encounter of 12/08/15 (from the past 24 hour(s))  Urinalysis, Routine w reflex microscopic (not at [facility])     Status: Abnormal   Collection Time: 12/08/15 12:23 PM  Result Value Ref Range   Color, Urine AMBER (A) YELLOW   APPearance CLEAR CLEAR   Specific Gravity, Urine 1.025 1.005 - 1.030   pH 6.5 5.0 - 8.0   Glucose, UA NEGATIVE NEGATIVE mg/dL   Hgb urine dipstick TRACE (A) NEGATIVE   Bilirubin Urine SMALL (A) NEGATIVE   Ketones, ur NEGATIVE NEGATIVE mg/dL   Protein, ur 30 (A) NEGATIVE mg/dL   Nitrite NEGATIVE NEGATIVE   Leukocytes, UA TRACE (A) NEGATIVE  Urine microscopic-add on     Status: Abnormal   Collection Time: 12/08/15 12:23 PM  Result Value Ref Range   Squamous Epithelial / LPF 6-30 (A) NONE SEEN   WBC, UA 0-5 0 - 5 WBC/hpf   RBC / HPF 0-5 0 - 5 RBC/hpf   Bacteria, UA MANY (A) NONE SEEN   Urine-Other MUCOUS PRESENT    Fetal Monitoring: Baseline: 135 bpm, moderate variability, + accelerations, no decelerations Contractions: none   MAU Course  Procedures None  MDM UA today Urine culture ODT Zofran given - patient reports resolution of symptoms and is tolerating PO while in MAU Patient is aware that she has missed multiple prenatal appointments and states that this is because she was told that they would check her cervix at all visits from now on and she feels that causes too much pain.   Assessment and Plan  A: SIUP at [redacted]w[redacted]d Nausea and vomiting in pregnancy  P: Discharge home Rx for Zofran given to patient Labor precautions discussed Patient advised to follow-up with [facility] next week. In basket message sent to make appointment and call patient Patient may return to MAU as needed or if her condition were to change or  worsen  Marny Lowenstein, PA-C  12/08/2015, 1:46 PM

## 2015-12-09 LAB — CULTURE, OB URINE

## 2015-12-18 IMAGING — US US OB TRANSVAGINAL
1 series · 14 of 28 positions shown · non-contrast
Comparison: 04/30/2015

CLINICAL DATA: Right lower quadrant pain x3 days, pregnant

EXAM:
TRANSVAGINAL OB ULTRASOUND
TECHNIQUE: Transvaginal ultrasound was performed for complete evaluation of the
gestation as well as the maternal uterus, adnexal regions, and
pelvic cul-de-sac.

[Series 1: us ob comp less 14 wk · 14 of 34 slices shown]
[im 2/34]
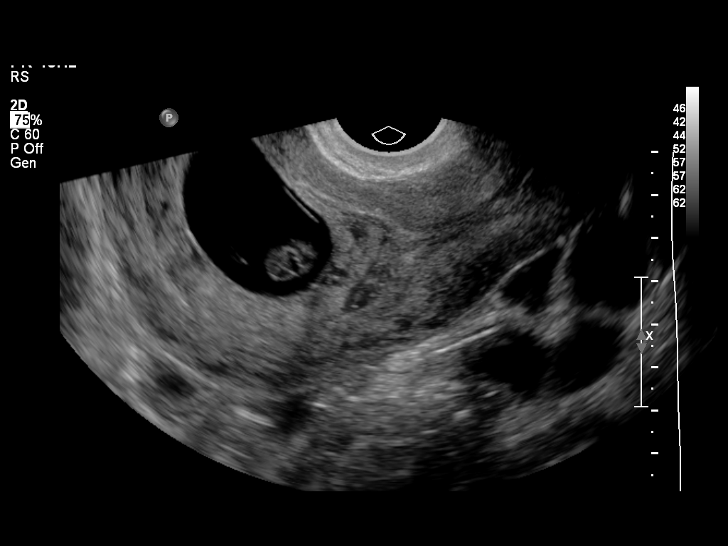
[im 4/34]
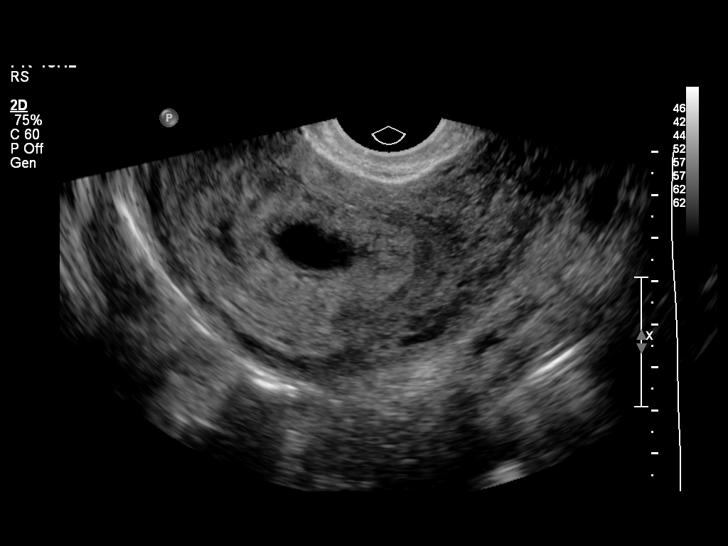
[im 7/34]
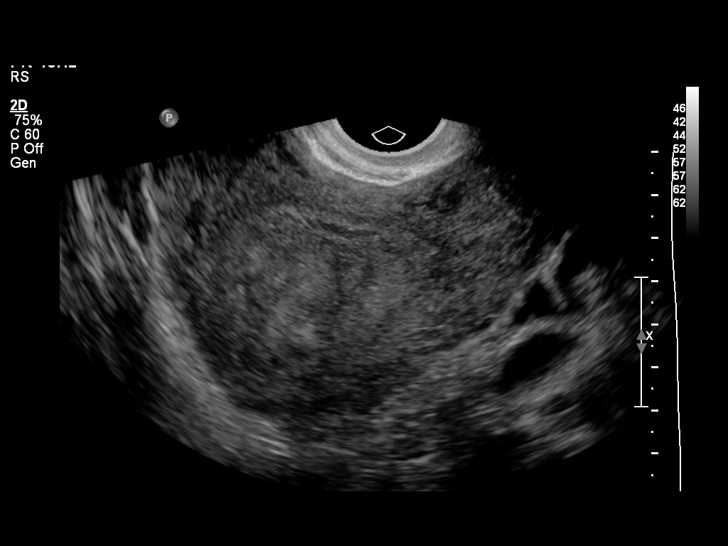
[im 9/34]
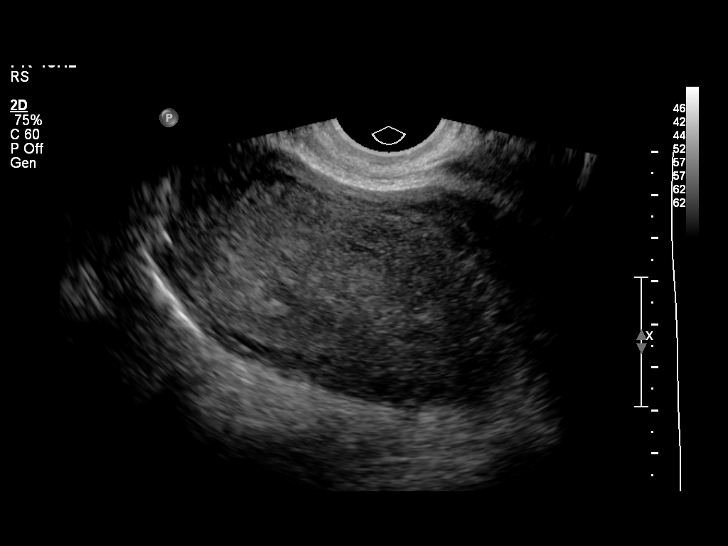
[im 12/34]
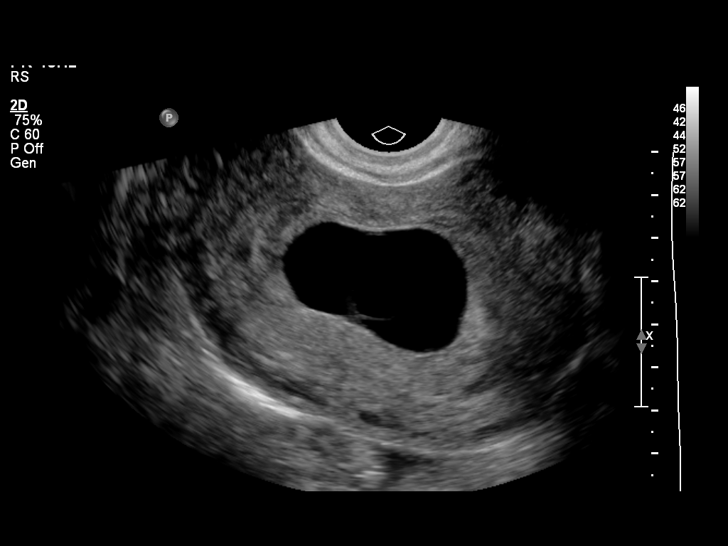
[im 14/34]
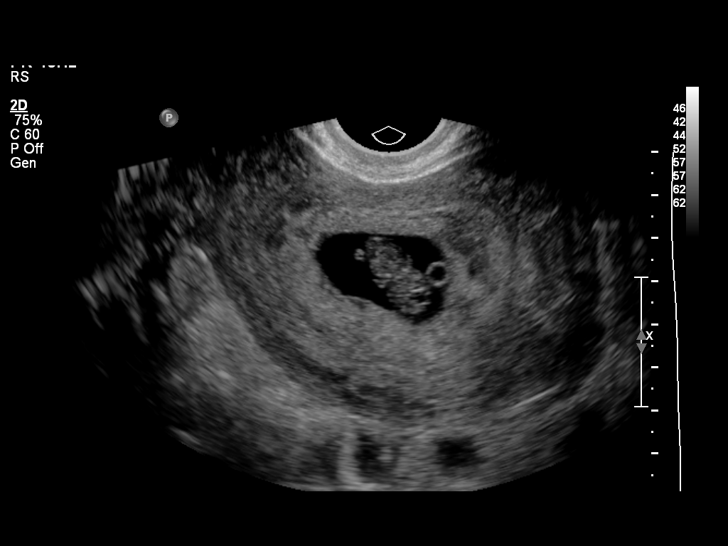
[im 16/34]
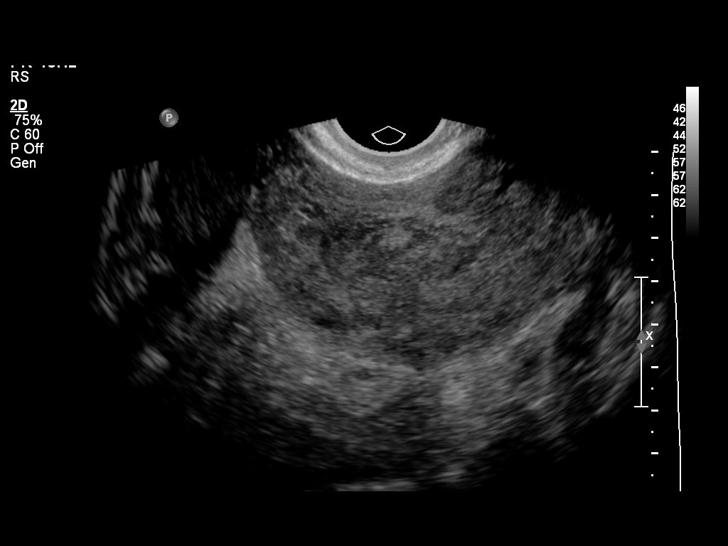
[im 19/34]
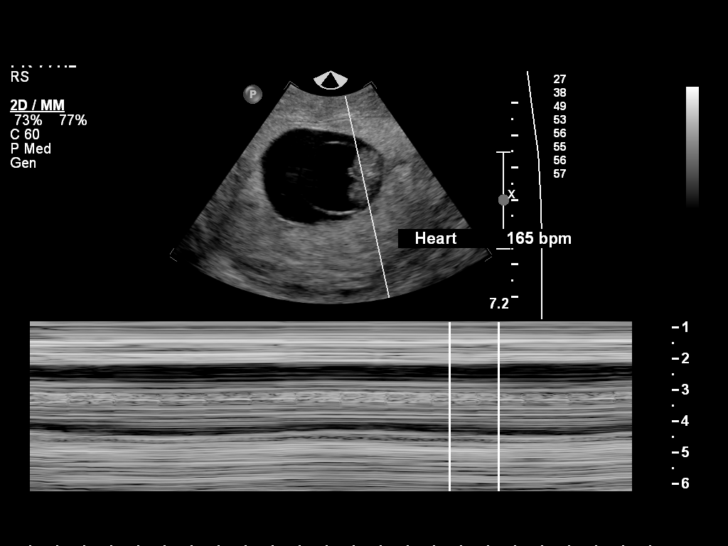
[im 21/34]
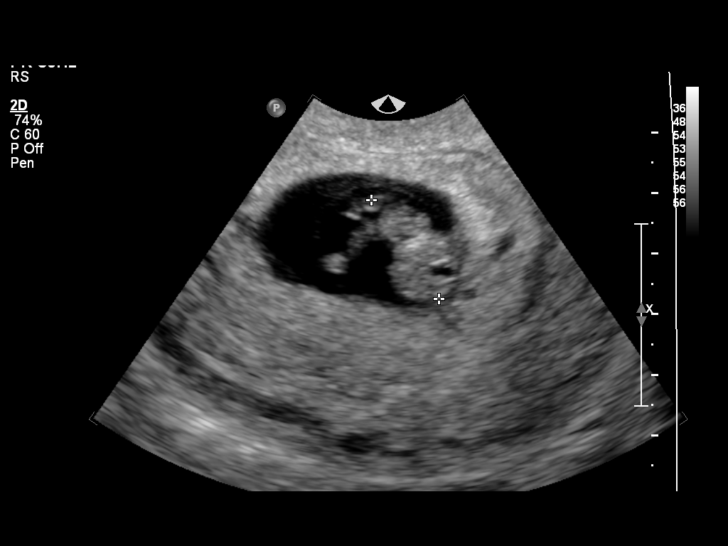
[im 24/34]
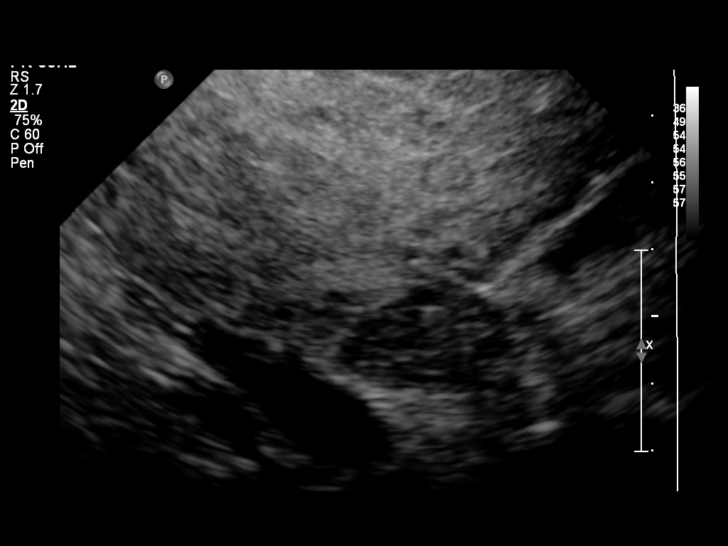
[im 26/34]
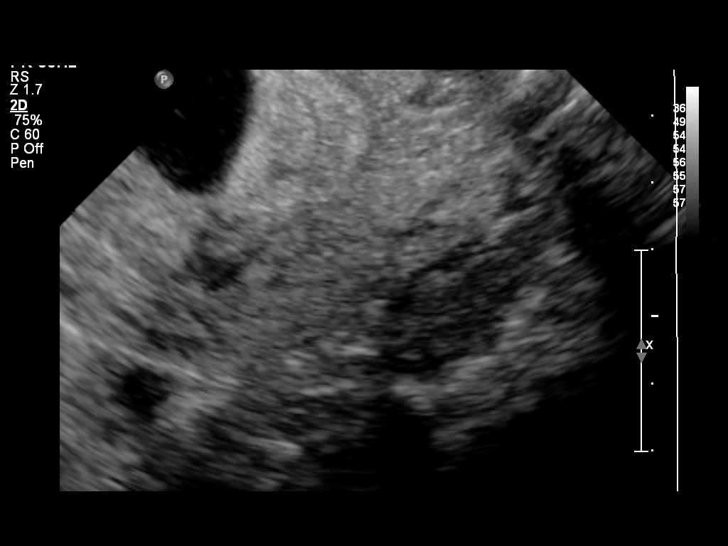
[im 29/34]
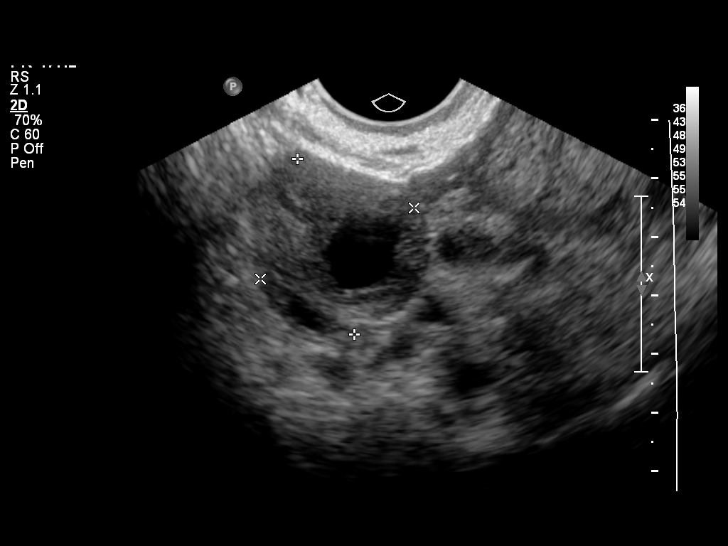
[im 31/34]
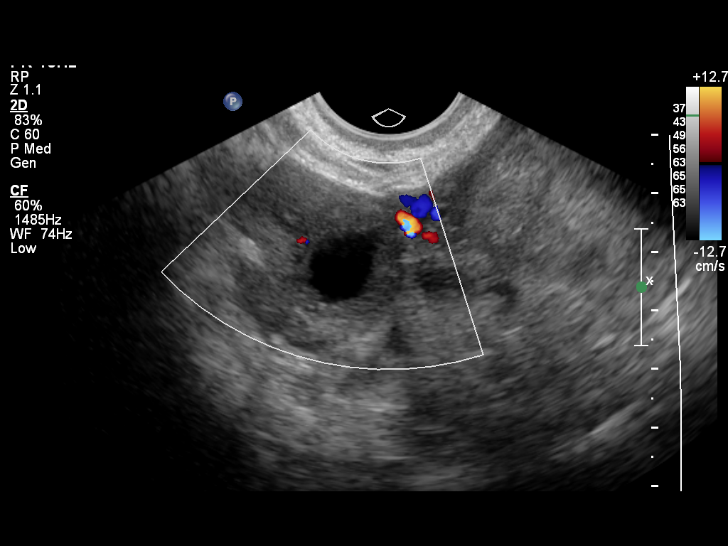
[im 34/34]
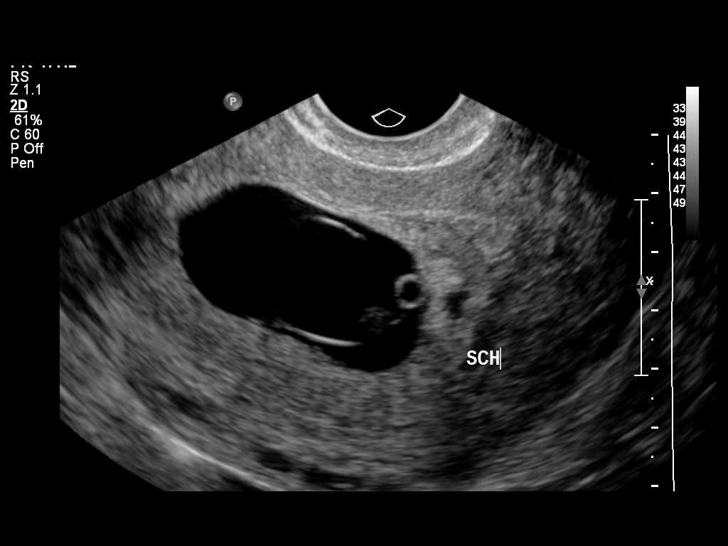

[14 of 28 positions shown; findings below may reference images not displayed]

FINDINGS: Intrauterine gestational sac: Visualized/normal in shape.

Yolk sac:  Present

Embryo:  Present

Cardiac Activity: Present

Heart Rate: 165 bpm

CRL:   20.6  mm   8 w 5 d                  US EDC: 12/24/2015

Maternal uterus/adnexae: Small subchronic hemorrhage.

Bilateral ovaries within normal limits.

No free fluid.
IMPRESSION: Single live intrauterine gestation with estimated gestational age 8
weeks 5 days by crown-rump length.

## 2015-12-21 ENCOUNTER — Inpatient Hospital Stay (HOSPITAL_COMMUNITY): Payer: Medicaid Other | Admitting: Anesthesiology

## 2015-12-21 ENCOUNTER — Inpatient Hospital Stay (HOSPITAL_COMMUNITY)
Admission: AD | Admit: 2015-12-21 | Discharge: 2015-12-22 | DRG: 775 | Disposition: A | Discharge status: Home / Self Care | Payer: Medicaid Other | Source: Ambulatory Visit | Attending: Obstetrics & Gynecology | Admitting: Obstetrics & Gynecology

## 2015-12-21 ENCOUNTER — Encounter (HOSPITAL_COMMUNITY): Payer: Self-pay | Admitting: *Deleted

## 2015-12-21 DIAGNOSIS — Z87891 Personal history of nicotine dependence: Secondary | ICD-10-CM | POA: Diagnosis not present

## 2015-12-21 DIAGNOSIS — K219 Gastro-esophageal reflux disease without esophagitis: Secondary | ICD-10-CM | POA: Diagnosis present

## 2015-12-21 DIAGNOSIS — O9962 Diseases of the digestive system complicating childbirth: Principal | ICD-10-CM | POA: Diagnosis present

## 2015-12-21 DIAGNOSIS — Z3A39 39 weeks gestation of pregnancy: Secondary | ICD-10-CM | POA: Diagnosis not present

## 2015-12-21 DIAGNOSIS — Z3493 Encounter for supervision of normal pregnancy, unspecified, third trimester: Secondary | ICD-10-CM

## 2015-12-21 DIAGNOSIS — Z823 Family history of stroke: Secondary | ICD-10-CM

## 2015-12-21 DIAGNOSIS — O99333 Smoking (tobacco) complicating pregnancy, third trimester: Secondary | ICD-10-CM

## 2015-12-21 DIAGNOSIS — Z833 Family history of diabetes mellitus: Secondary | ICD-10-CM

## 2015-12-21 DIAGNOSIS — Z820 Family history of epilepsy and other diseases of the nervous system: Secondary | ICD-10-CM

## 2015-12-21 DIAGNOSIS — O9952 Diseases of the respiratory system complicating childbirth: Secondary | ICD-10-CM | POA: Diagnosis present

## 2015-12-21 DIAGNOSIS — Z8249 Family history of ischemic heart disease and other diseases of the circulatory system: Secondary | ICD-10-CM

## 2015-12-21 DIAGNOSIS — O4290 Premature rupture of membranes, unspecified as to length of time between rupture and onset of labor, unspecified weeks of gestation: Secondary | ICD-10-CM | POA: Diagnosis present

## 2015-12-21 DIAGNOSIS — K8020 Calculus of gallbladder without cholecystitis without obstruction: Secondary | ICD-10-CM | POA: Diagnosis present

## 2015-12-21 LAB — RPR: RPR Ser Ql: NONREACTIVE

## 2015-12-21 LAB — CBC
HEMATOCRIT: 33.1 % — AB (ref 36.0–46.0)
HEMOGLOBIN: 10.8 g/dL — AB (ref 12.0–15.0)
MCH: 27.5 pg (ref 26.0–34.0)
MCHC: 32.6 g/dL (ref 30.0–36.0)
MCV: 84.2 fL (ref 78.0–100.0)
Platelets: 355 10*3/uL (ref 150–400)
RBC: 3.93 MIL/uL (ref 3.87–5.11)
RDW: 14.4 % (ref 11.5–15.5)
WBC: 19.2 10*3/uL — ABNORMAL HIGH (ref 4.0–10.5)

## 2015-12-21 LAB — RAPID URINE DRUG SCREEN, HOSP PERFORMED
Amphetamines: NOT DETECTED
BENZODIAZEPINES: NOT DETECTED
Barbiturates: NOT DETECTED
COCAINE: NOT DETECTED
OPIATES: NOT DETECTED
TETRAHYDROCANNABINOL: POSITIVE — AB

## 2015-12-21 LAB — TYPE AND SCREEN
ABO/RH(D): O POS
ANTIBODY SCREEN: NEGATIVE

## 2015-12-21 LAB — POCT FERN TEST: POCT FERN TEST: NEGATIVE

## 2015-12-21 MED ORDER — ONDANSETRON HCL 4 MG/2ML IJ SOLN: 4.0000 mg | INTRAMUSCULAR | Status: DC | PRN

## 2015-12-21 MED ORDER — ALUM & MAG HYDROXIDE-SIMETH 200-200-20 MG/5ML PO SUSP
30.0000 mL | Freq: Four times a day (QID) | ORAL | Status: DC | PRN
  Administered 2015-12-21: 30 mL via ORAL
  Filled 2015-12-21 (×2): qty 30

## 2015-12-21 MED ORDER — OXYCODONE-ACETAMINOPHEN 5-325 MG PO TABS
2.0000 | ORAL_TABLET | ORAL | Status: DC | PRN
  Administered 2015-12-21: 2 via ORAL
  Filled 2015-12-21: qty 2

## 2015-12-21 MED ORDER — DIPHENHYDRAMINE HCL 25 MG PO CAPS: 25.0000 mg | ORAL_CAPSULE | Freq: Four times a day (QID) | ORAL | Status: DC | PRN

## 2015-12-21 MED ORDER — ACETAMINOPHEN 325 MG PO TABS: 650.0000 mg | ORAL_TABLET | ORAL | Status: DC | PRN

## 2015-12-21 MED ORDER — IBUPROFEN 600 MG PO TABS
600.0000 mg | ORAL_TABLET | Freq: Four times a day (QID) | ORAL | Status: DC
  Administered 2015-12-21 – 2015-12-22 (×4): 600 mg via ORAL
  Filled 2015-12-21 (×4): qty 1

## 2015-12-21 MED ORDER — ONDANSETRON HCL 4 MG PO TABS
4.0000 mg | ORAL_TABLET | ORAL | Status: DC | PRN
  Administered 2015-12-21: 4 mg via ORAL
  Filled 2015-12-21: qty 1

## 2015-12-21 MED ORDER — FAMOTIDINE 20 MG PO TABS
20.0000 mg | ORAL_TABLET | Freq: Every day | ORAL | Status: DC
  Administered 2015-12-21: 20 mg via ORAL
  Filled 2015-12-21: qty 1

## 2015-12-21 MED ORDER — PRENATAL MULTIVITAMIN CH
1.0000 | ORAL_TABLET | Freq: Every day | ORAL | Status: DC
  Administered 2015-12-22: 1 via ORAL
  Filled 2015-12-21: qty 1

## 2015-12-21 MED ORDER — CITRIC ACID-SODIUM CITRATE 334-500 MG/5ML PO SOLN
30.0000 mL | ORAL | Status: DC | PRN
Start: 2015-12-21 — End: 2015-12-21

## 2015-12-21 MED ORDER — FENTANYL 2.5 MCG/ML BUPIVACAINE 1/10 % EPIDURAL INFUSION (WH - ANES)
14.0000 mL/h | INTRAMUSCULAR | Status: DC | PRN
  Administered 2015-12-21: 14 mL/h via EPIDURAL

## 2015-12-21 MED ORDER — FENTANYL CITRATE (PF) 100 MCG/2ML IJ SOLN
100.0000 ug | INTRAMUSCULAR | Status: DC | PRN
  Administered 2015-12-21: 100 ug via INTRAVENOUS
  Filled 2015-12-21: qty 2

## 2015-12-21 MED ORDER — SIMETHICONE 80 MG PO CHEW: 80.0000 mg | CHEWABLE_TABLET | ORAL | Status: DC | PRN

## 2015-12-21 MED ORDER — LACTATED RINGERS IV SOLN
INTRAVENOUS | Status: DC
Start: 2015-12-21 — End: 2015-12-21
  Administered 2015-12-21: 08:00:00 via INTRAVENOUS

## 2015-12-21 MED ORDER — LIDOCAINE HCL (PF) 1 % IJ SOLN
30.0000 mL | INTRAMUSCULAR | Status: DC | PRN
  Filled 2015-12-21: qty 30

## 2015-12-21 MED ORDER — DIBUCAINE 1 % RE OINT: 1.0000 "application " | TOPICAL_OINTMENT | RECTAL | Status: DC | PRN

## 2015-12-21 MED ORDER — WITCH HAZEL-GLYCERIN EX PADS: 1.0000 "application " | MEDICATED_PAD | CUTANEOUS | Status: DC | PRN

## 2015-12-21 MED ORDER — OXYCODONE-ACETAMINOPHEN 5-325 MG PO TABS
1.0000 | ORAL_TABLET | ORAL | Status: DC | PRN
  Administered 2015-12-22: 1 via ORAL
  Filled 2015-12-21: qty 1

## 2015-12-21 MED ORDER — OXYTOCIN 40 UNITS IN LACTATED RINGERS INFUSION - SIMPLE MED
62.5000 mL/h | INTRAVENOUS | Status: DC
  Filled 2015-12-21: qty 1000

## 2015-12-21 MED ORDER — ZOLPIDEM TARTRATE 5 MG PO TABS: 5.0000 mg | ORAL_TABLET | Freq: Every evening | ORAL | Status: DC | PRN

## 2015-12-21 MED ORDER — ACETAMINOPHEN 325 MG PO TABS
650.0000 mg | ORAL_TABLET | ORAL | Status: DC | PRN
  Administered 2015-12-21: 650 mg via ORAL
  Filled 2015-12-21: qty 2

## 2015-12-21 MED ORDER — OXYCODONE-ACETAMINOPHEN 5-325 MG PO TABS
2.0000 | ORAL_TABLET | ORAL | Status: DC | PRN
Start: 2015-12-21 — End: 2015-12-22

## 2015-12-21 MED ORDER — LANOLIN HYDROUS EX OINT: TOPICAL_OINTMENT | CUTANEOUS | Status: DC | PRN

## 2015-12-21 MED ORDER — LACTATED RINGERS IV SOLN
500.0000 mL | INTRAVENOUS | Status: DC | PRN
Start: 2015-12-21 — End: 2015-12-21
  Administered 2015-12-21: 500 mL via INTRAVENOUS

## 2015-12-21 MED ORDER — ONDANSETRON HCL 4 MG/2ML IJ SOLN
4.0000 mg | Freq: Four times a day (QID) | INTRAMUSCULAR | Status: DC | PRN
  Administered 2015-12-21: 4 mg via INTRAVENOUS
  Filled 2015-12-21: qty 2

## 2015-12-21 MED ORDER — FENTANYL 2.5 MCG/ML BUPIVACAINE 1/10 % EPIDURAL INFUSION (WH - ANES)
INTRAMUSCULAR | Status: AC
  Filled 2015-12-21: qty 125

## 2015-12-21 MED ORDER — PHENYLEPHRINE 40 MCG/ML (10ML) SYRINGE FOR IV PUSH (FOR BLOOD PRESSURE SUPPORT)
PREFILLED_SYRINGE | INTRAVENOUS | Status: AC
  Filled 2015-12-21: qty 20

## 2015-12-21 MED ORDER — DIPHENHYDRAMINE HCL 50 MG/ML IJ SOLN: 12.5000 mg | INTRAMUSCULAR | Status: DC | PRN

## 2015-12-21 MED ORDER — BENZOCAINE-MENTHOL 20-0.5 % EX AERO: 1.0000 "application " | INHALATION_SPRAY | CUTANEOUS | Status: DC | PRN

## 2015-12-21 MED ORDER — SENNOSIDES-DOCUSATE SODIUM 8.6-50 MG PO TABS
2.0000 | ORAL_TABLET | ORAL | Status: DC
  Administered 2015-12-21 (×2): 2 via ORAL
  Filled 2015-12-21 (×2): qty 2

## 2015-12-21 MED ORDER — TETANUS-DIPHTH-ACELL PERTUSSIS 5-2.5-18.5 LF-MCG/0.5 IM SUSP: 0.5000 mL | Freq: Once | INTRAMUSCULAR | Status: DC

## 2015-12-21 MED ORDER — EPHEDRINE 5 MG/ML INJ
10.0000 mg | INTRAVENOUS | Status: DC | PRN
  Filled 2015-12-21: qty 2

## 2015-12-21 MED ORDER — OXYTOCIN BOLUS FROM INFUSION
500.0000 mL | INTRAVENOUS | Status: DC
  Administered 2015-12-21: 500 mL via INTRAVENOUS

## 2015-12-21 MED ORDER — LIDOCAINE HCL (PF) 1 % IJ SOLN
INTRAMUSCULAR | Status: DC | PRN
  Administered 2015-12-21 (×2): 4 mL via EPIDURAL

## 2015-12-21 MED ORDER — PHENYLEPHRINE 40 MCG/ML (10ML) SYRINGE FOR IV PUSH (FOR BLOOD PRESSURE SUPPORT)
80.0000 ug | PREFILLED_SYRINGE | INTRAVENOUS | Status: DC | PRN
  Filled 2015-12-21: qty 2

## 2015-12-21 MED ORDER — OXYCODONE-ACETAMINOPHEN 5-325 MG PO TABS: 1.0000 | ORAL_TABLET | ORAL | Status: DC | PRN

## 2015-12-21 NOTE — Anesthesia Procedure Notes (Signed)
Epidural Patient location during procedure: OB Start time: 12/21/2015 4:52 AM  Staffing Anesthesiologist: Mal AmabileFOSTER, Faren Florence Performed by: anesthesiologist   Preanesthetic Checklist Completed: patient identified, site marked, surgical consent, pre-op evaluation, timeout performed, IV checked, risks and benefits discussed and monitors and equipment checked  Epidural Patient position: sitting Prep: site prepped and draped and DuraPrep Patient monitoring: continuous pulse ox and blood pressure Approach: midline Location: L4-L5 Injection technique: LOR air  Needle:  Needle type: Tuohy  Needle gauge: 17 G Needle length: 9 cm and 9 Needle insertion depth: 5 cm cm Catheter type: closed end flexible Catheter size: 19 Gauge Catheter at skin depth: 10 cm Test dose: negative and Other  Assessment Events: blood not aspirated, injection not painful, no injection resistance, negative IV test and no paresthesia  Additional Notes Patient identified. Risks and benefits discussed including failed block, incomplete  Pain control, post dural puncture headache, nerve damage, paralysis, blood pressure Changes, nausea, vomiting, reactions to medications-both toxic and allergic and post Partum back pain. All questions were answered. Patient expressed understanding and wished to proceed. Sterile technique was used throughout procedure. Epidural site was Dressed with sterile barrier dressing. No paresthesias, signs of intravascular injection Or signs of intrathecal spread were encountered.  Patient was more comfortable after the epidural was dosed. Please see RN's note for documentation of vital signs and FHR which are stable.

## 2015-12-21 NOTE — Progress Notes (Signed)
Delivery of live viable female by Rochele PagesWalidah Karim, CNM APGARS 8,9

## 2015-12-21 NOTE — MAU Note (Signed)
Pt presents with ROM at 0200 and contractions.

## 2015-12-21 NOTE — Anesthesia Preprocedure Evaluation (Signed)
Anesthesia Evaluation  Patient identified by MRN, date of birth, ID band Patient awake    Reviewed: Allergy & Precautions, Patient's Chart, lab work & pertinent test results  Airway Mallampati: III  TM Distance: >3 FB Neck ROM: Full    Dental no notable dental hx. (+) Teeth Intact   Pulmonary former smoker,    Pulmonary exam normal breath sounds clear to auscultation       Cardiovascular negative cardio ROS Normal cardiovascular exam Rhythm:Regular Rate:Normal     Neuro/Psych negative neurological ROS  negative psych ROS   GI/Hepatic Neg liver ROS, GERD  Medicated and Controlled,  Endo/Other  Morbid obesity  Renal/GU negative Renal ROS  negative genitourinary   Musculoskeletal negative musculoskeletal ROS (+)   Abdominal (+) + obese,   Peds  Hematology  (+) anemia ,   Anesthesia Other Findings   Reproductive/Obstetrics (+) Pregnancy                             Anesthesia Physical Anesthesia Plan  ASA: III  Anesthesia Plan: Epidural   Post-op Pain Management:    Induction:   Airway Management Planned: Natural Airway  Additional Equipment:   Intra-op Plan:   Post-operative Plan:   Informed Consent: I have reviewed the patients History and Physical, chart, labs and discussed the procedure including the risks, benefits and alternatives for the proposed anesthesia with the patient or authorized representative who has indicated his/her understanding and acceptance.     Plan Discussed with: Anesthesiologist  Anesthesia Plan Comments:         Anesthesia Quick Evaluation  

## 2015-12-21 NOTE — H&P (Signed)
Caitlin Sullivan is a 24 y.o. female G3P1011 @ 39.3wks by LMP and confirmed by 6wk scan presenting for leaking fluid and having reg ctx since 0200. Denies bldg. No H/A, N/V/D. [facility] and has been remarkable for 1) [facility] 2) cholelithiasis 3) MJ use in preg  History OB History    Gravida Para Term Preterm AB TAB SAB Ectopic Multiple Living   3 1 1  1 1    1      Past Medical History  Diagnosis Date  . Umbilical hernia   . Hyperemesis arising during pregnancy   . Asthma   . Gall stones   . Iron deficiency anemia   . Heart murmur   . GERD (gastroesophageal reflux disease)    Past Surgical History  Procedure Laterality Date  . Pilonidal cyst / sinus excision    . Cholecystectomy    . Cholecystectomy N/A 09/22/2014    Procedure: LAPAROSCOPIC CHOLECYSTECTOMY;  Surgeon: Axel FillerArmando Ramirez, MD;  Location: [facility] OR;  Service: General;  Laterality: N/A;   Family History: family history includes Asthma in her brother; Cancer in her mother; Diabetes in her father; Hyperlipidemia in her father; Hypertension in her father and mother; Multiple sclerosis in her sister; Stroke in her father. Social History:  reports that she quit smoking about 15 months ago. Her smoking use included Cigarettes. She smoked 0.25 packs per day. She has never used smokeless tobacco. She reports that she does not drink alcohol or use illicit drugs.   Prenatal Transfer Tool  Maternal Diabetes: No Genetic Screening: Normal Maternal Ultrasounds/Referrals: Normal Fetal Ultrasounds or other Referrals:  None Maternal Substance Abuse:  Yes:  Type: Marijuana Significant Maternal Medications:  None Significant Maternal Lab Results:  Lab values include: Group B Strep negative Other Comments:  None  ROS  Dilation: 3.5 Effacement (%): 90 Station: -2 Exam by:: Weston,RN Blood pressure 123/64, pulse 94, temperature 97.6 F (36.4 C), temperature source Oral, resp. rate  18, height 5\' 5"  (1.651 m), weight 107.049 kg (236 lb), last menstrual period 03/20/2015. Exam Physical Exam  Constitutional: She is oriented to person, place, and time. She appears well-developed.  HENT:  Head: Normocephalic.  Neck: Normal range of motion.  Cardiovascular: Normal rate.   Respiratory: Effort normal.  GI:  EFM 150s, +accels, no decels Ctx q 3-5 mins  Genitourinary: Vagina normal.  SSE: +pool, +fern  Musculoskeletal: Normal range of motion.  Neurological: She is alert and oriented to person, place, and time.  Skin: Skin is warm and dry.  Psychiatric: She has a normal mood and affect. Her behavior is normal. Thought content normal.    Prenatal labs: ABO, Rh: O/Positive/-- (06/23 0000) Antibody: Negative (06/23 0000) Rubella: Immune (06/23 0000) RPR: NON REAC (10/12 1012)  HBsAg: Negative (06/23 0000)  HIV: NONREACTIVE (10/12 1012)  GBS: Negative (06/23 0000)   Assessment/Plan: IUP@39 .3wks SROM Early labor GBS neg  [facility] management Anticipate SVD UDS pending   Cam HaiSHAW, Undray Allman CNM 12/21/2015, 3:44 AM

## 2015-12-21 NOTE — Lactation Note (Signed)
This note was copied from the chart of Caitlin Sullivan. Lactation Consultation Note  Patient Name: Caitlin Sullivan ZOXWR'UToday's Date: 12/21/2015 Reason for consult: Initial assessment Baby at 7 hr of life and mom reports sore nipples. Mom was holding baby away from the nipple and only letting her grab the tip. Showed mom how to pull baby closer and get a deeper latch. Mom started having uterine cramps while feeding, assured her it was normal. She was in so much pain she began to cry. LC is not sure mom will remember all of the teaching. Discussed baby behavior, feeding frequency, baby belly size, voids, wt loss, breast changes, and nipple care. Demonstrated manual expression, colostrum noted bilaterally. Given lactation handouts. Aware of OP services and support group. Reported mom's pain to RN.        Maternal Data Has patient been taught Hand Expression?: Yes Does the patient have breastfeeding experience prior to this delivery?: Yes  Feeding Feeding Type: Breast Fed Length of feed: 30 min  LATCH Score/Interventions Latch: Grasps breast easily, tongue down, lips flanged, rhythmical sucking.  Audible Swallowing: Spontaneous and intermittent Intervention(s): Skin to skin;Hand expression  Type of Nipple: Everted at rest and after stimulation  Comfort (Breast/Nipple): Filling, red/small blisters or bruises, mild/mod discomfort  Problem noted: Mild/Moderate discomfort Interventions (Mild/moderate discomfort): Hand expression  Hold (Positioning): Assistance needed to correctly position infant at breast and maintain latch. Intervention(s): Support Pillows;Position options  LATCH Score: 8  Lactation Tools Discussed/Used     Consult Status Consult Status: Follow-up Date: 12/22/15 Follow-up type: In-patient    [name] 12/21/2015, 5:38 PM

## 2015-12-22 MED ORDER — IBUPROFEN 600 MG PO TABS: 600.0000 mg | ORAL_TABLET | Freq: Four times a day (QID) | ORAL | Status: DC

## 2015-12-22 NOTE — Lactation Note (Signed)
This note was copied from the chart of Caitlin Caitlin Sullivan. Lactation Consultation Note; Mom asked that I come back later because baby is resting now.   Patient Name: Caitlin Sullivan Today's Date: 12/22/2015     Maternal Data    Feeding Feeding Type: Breast Fed  LATCH Score/Interventions Latch: Grasps breast easily, tongue down, lips flanged, rhythmical sucking.  Audible Swallowing: A few with stimulation  Type of Nipple: Everted at rest and after stimulation  Comfort (Breast/Nipple): Filling, red/small blisters or bruises, mild/mod discomfort     Hold (Positioning): Assistance needed to correctly position infant at breast and maintain latch.  LATCH Score: 7  Lactation Tools Discussed/Used     Consult Status      Pamelia HoitWeeks, Valaree Fresquez D 12/22/2015, 11:23 AM

## 2015-12-22 NOTE — Anesthesia Postprocedure Evaluation (Signed)
Anesthesia Post Note  Patient: Caitlin SlimNancy Balin  Procedure(s) Performed: * No procedures listed *  Patient location during evaluation: Mother Baby Anesthesia Type: Epidural Level of consciousness: awake and alert and oriented Pain management: pain level controlled Vital Signs Assessment: post-procedure vital signs reviewed and stable Respiratory status: spontaneous breathing Cardiovascular status: blood pressure returned to baseline and stable Postop Assessment: no headache, no backache, patient able to bend at knees, no signs of nausea or vomiting and adequate PO intake Anesthetic complications: no    Last Vitals:  Filed Vitals:   12/22/15 0100 12/22/15 0629  BP: 122/67 124/63  Pulse: 67 70  Temp: 36.7 C 36.5 C  Resp: 18 18    Last Pain:  Filed Vitals:   12/22/15 0630  PainSc: 6                  [name]

## 2015-12-22 NOTE — Discharge Instructions (Signed)

## 2015-12-22 NOTE — Progress Notes (Signed)
CLINICAL SOCIAL WORK MATERNAL/CHILD NOTE  Patient Details  Name: Caitlin Sullivan MRN: [id number] Date of Birth: 12/21/2014  Date:  12/22/2015  Clinical Social Worker Initiating Note:  Nhat Hearne E. Frankie Zito, LCSW Date/ Time Initiated:  12/22/15/1430     Child's Name:  Caitlin Sullivan   Legal Guardian:   (Parents: [name] and [name])   Need for Interpreter:  None   Date of Referral:  12/22/15     Reason for Referral:  Current Substance Use/Substance Use During Pregnancy    Referral Source:  Central Nursery   Address:  [address] [place], [place] [address]  Phone number:  [phone]   Household Members:  Minor Children, Significant Other (MOB lives with FOB and their 2 year old son Caitlin Sullivan)   Natural Supports (not living in the home):  Immediate Family, Extended Family, Friends   Professional Supports: None   Employment: Full-time   Type of Work:  (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  FOB works for a private contractor.)   Education:      Financial Resources:  Medicaid   Other Resources:      Cultural/Religious Considerations Which May Impact Care: None stated.  MOB's facesheet notes religion as Catholic.  Strengths:  Ability to meet basic needs , Compliance with medical plan , Home prepared for child    Risk Factors/Current Problems:  Substance Use , Mental Health Concerns  (Current marijuana use and hx of PPD)   Cognitive State:  Alert , Able to Concentrate , Linear Thinking , Goal Oriented , Insightful    Mood/Affect:  Interested , Calm , Tearful    CSW Assessment: CSW met with MOB in her first floor room to complete assessment due to marijuana use in pregnancy.  MOB had a positive UDS for THC on admission.  MOB had visitors when CSW arrived and she asked them to step out to speak with CSW privately.  When visitors left, MOB told CSW that she knew why CSW was there.  She states she knows  about her positive drug screen and thought she would have to talk with someone about it.  CSW explained support role as well and began by inquiring about MOB's marijuana use.   MOB reports that her sister has MS and that she told MOB to smoke a little marijuana to help her with nausea and vomiting.  MOB reports having hyperemesis and not being able to keep any food down.  She states she tried marijuana and it helped her eat and be less sick.  She states she did a lot of research about it and realizes that it is still inhaled smoke and could be harmful to baby.  MOB appears to be feeling guilty about using marijuana.  CSW provided supportive counseling as MOB processed her feelings around her use.  CSW informed MOB of hospital drug screen policy and mandated CPS reporting for positive infant screens.  CSW also discussed what to expect with MOB, which seemed to make her feel much better.  MOB stated understanding and thanked CSW for talking with her about it.  MOB states no plans to continue using now that she is no longer nauseous.  She reports no other drug use and expects that baby will be positive since she estimates last use about a week ago.   CSW provided education regarding perinatal mood disorders and inquired about MOB's history as well as how she   felt emotionally during pregnancy.  MOB began to cry and stated that she had "PPD really bad after my first son."  CSW asked about her symptoms.  She reports that she felt very depressed and cried a lot.  She states she is not concerned that she will have PPD again because she is in a "better situation."  CSW asked her to talk about what has changed as a way of highlighting strengths.  She states it makes her emotional to think about, but was open to sharing her story with CSW.  She states she and FOB were not stable when their son was born and that they had recently moved here from [place] where she left a good job.  She reports that they experienced homelessness and  were struggling with making ends meet.  She reports that they have a home now and both have good jobs.  CSW commends her for the accomplishments she has made and encouraged her to celebrate how far her family has come.  CSW discussed signs and symptoms to watch for and explained the importance of talking with her doctor if concerns arise.  CSW offered for her to call the hospital at any time to speak with a CSW and told her about the Feelings After Birth Support group at the hospital.  MOB seemed appreciative of CSW's concern for her emotional wellbeing.  CSW suggests she consider talking with a counselor given how emotional she became when talking about her past.  She denies need for a counselor at this time, but seemed willing to consider counseling should new concerns arise.   CSW will monitor baby's drug screen results and make report if warranted.  CSW identifies no barriers to discharge.  CSW Plan/Description:  Patient/Family Education , No Further Intervention Required/No Barriers to Discharge    Alphonzo Cruise, [place] 12/22/2015, 4:24 PM

## 2015-12-22 NOTE — Discharge Summary (Signed)
OB Discharge Summary     Patient Name: Caitlin Sullivan DOB: [birth year] MRN: [id number]  Date of admission: 12/21/2015 Delivering MD: Marlis Edelson   Date of discharge: 12/22/2015  Admitting diagnosis: 39 WEEKS ROM Intrauterine pregnancy: [redacted]w[redacted]d     Secondary diagnosis:  Active Problems:   Leakage, amniotic fluid  Additional problems: nausea, relieved with Mylanta     Discharge diagnosis: Term Pregnancy Delivered                                                                                                Post partum procedures:none  Augmentation: none  Complications: None  Hospital course:  Onset of Labor With Vaginal Delivery     24 y.o. yo [id number] at [redacted]w[redacted]d was admitted in Active Laboron 12/21/2015. Patient had an uncomplicated labor course as follows:  Membrane Rupture Time/Date: 2:30 AM ,12/21/2015   Intrapartum Procedures: Episiotomy: None [1]                                         Lacerations:  None [1]  Patient had a delivery of a Viable infant. 12/21/2015  Information for the patient's newborn:  Jhaniya, Briski [[id number]]  Delivery Method: Vaginal, Spontaneous Delivery (Filed from Delivery Summary)    Pateint had an uncomplicated postpartum course.  She is ambulating, tolerating a regular diet, passing flatus, and urinating well. Patient is discharged home in stable condition on No discharge date for patient encounter.Marland Kitchen    Physical exam  Filed Vitals:   12/21/15 1336 12/21/15 1705 12/22/15 0100 12/22/15 0629  BP: 120/94 134/69 122/67 124/63  Pulse: 79 77 67 70  Temp:  98.2 F (36.8 C) 98 F (36.7 C) 97.7 F (36.5 C)  TempSrc:  Oral Oral Oral  Resp: Height:      Weight:      SpO2:       General: alert, cooperative and no distress Lochia: appropriate Uterine Fundus: firm Incision: N/A DVT Evaluation: No evidence of DVT seen on physical exam. Labs: Lab Results  Component Value Date   WBC 19.2* 12/21/2015   HGB 10.8* 12/21/2015    HCT 33.1* 12/21/2015   MCV 84.2 12/21/2015   PLT 355 12/21/2015   CMP Latest Ref Rng 09/27/2015  Glucose 65 - 99 mg/dL 130(Q)  BUN 6 - 20 mg/dL 6  Creatinine 6.57 - 8.46 mg/dL 9.62(X)  Sodium 528 - 413 mmol/L 133(L)  Potassium 3.5 - 5.1 mmol/L 3.6  Chloride 101 - 111 mmol/L 105  CO2 22 - 32 mmol/L 23  Calcium 8.9 - 10.3 mg/dL 2.4(M)  Total Protein 6.5 - 8.1 g/dL 6.7  Total Bilirubin 0.3 - 1.2 mg/dL 0.6  Alkaline Phos 38 - 126 U/L 59  AST 15 - 41 U/L 11(L)  ALT 14 - 54 U/L 11(L)    Discharge instruction: per After Visit Summary and "Baby and Me Booklet".  After visit meds:    Medication List    STOP taking these medications  albuterol 108 (90 Base) MCG/ACT inhaler  Commonly known as:  PROVENTIL HFA;VENTOLIN HFA     omeprazole 20 MG capsule  Commonly known as:  PRILOSEC     ondansetron 8 MG disintegrating tablet  Commonly known as:  ZOFRAN ODT     promethazine 25 MG tablet  Commonly known as:  PHENERGAN      TAKE these medications        ibuprofen 600 MG tablet  Commonly known as:  ADVIL,MOTRIN  Take 1 tablet (600 mg total) by mouth every 6 (six) hours.     PRENATAL COMPLETE 14-0.4 MG Tabs  Take 14 mg by mouth daily.        Diet: routine diet  Activity: Advance as tolerated. Pelvic rest for 6 weeks.   Outpatient follow up:4 weeks Follow up Appt:No future appointments. Follow up Visit:No Follow-up on file.  Postpartum contraception: BTL vs Nexplanon. Discussed regret with BTL  Newborn Data: Live born female  Birth Weight: 7 lb 6.9 oz (3370 g) APGAR: 8, 9  Baby Feeding: Breast Disposition:home with mother   12/22/2015 Greig RightRESENZO-DISHMAN,Eual Lindstrom, CNM

## 2015-12-23 ENCOUNTER — Ambulatory Visit: Payer: Self-pay

## 2015-12-23 NOTE — Lactation Note (Signed)
This note was copied from the chart of Caitlin Alanda SlimNancy Mcmenamy. Lactation Consultation Note; Pediatrician talked with mom yesterday about marijuana use and recommended not breastfeeding for now. Can pump and dump to protect milk supply. Mom has not been pumping. [facility] and mom states she is just going to continue with formula. Reports breasts are feeling fuller this morning. Reviewed engorgement treatment. No questions at present. To call prn  Patient Name: Caitlin Sullivan [id number]'XToday's Date: 12/23/2015 Reason for consult: Follow-up assessment   Maternal Data    Feeding Nipple Type: Slow - flow  LATCH Score/Interventions                      Lactation Tools Discussed/Used     Consult Status Consult Status: Complete    Pamelia HoitWeeks, Camilla Skeen D 12/23/2015, 11:12 AM

## 2016-01-05 NOTE — Progress Notes (Signed)
Post discharge chart review completed.  

## 2016-02-01 ENCOUNTER — Ambulatory Visit: Payer: Medicaid Other | Admitting: Advanced Practice Midwife

## 2016-03-29 ENCOUNTER — Ambulatory Visit: Payer: Medicaid Other | Admitting: Obstetrics and Gynecology

## 2016-04-12 ENCOUNTER — Ambulatory Visit (INDEPENDENT_AMBULATORY_CARE_PROVIDER_SITE_OTHER): Payer: Medicaid Other | Admitting: Family Medicine

## 2016-04-12 DIAGNOSIS — Z3009 Encounter for other general counseling and advice on contraception: Secondary | ICD-10-CM

## 2016-04-12 NOTE — Patient Instructions (Signed)
Laparoscopic Tubal Ligation Laparoscopic tubal ligation is a procedure that closes the fallopian tubes at a time other than right after childbirth. When the fallopian tubes are closed, the eggs that are released from the ovaries cannot enter the uterus, and sperm cannot reach the egg. Tubal ligation is also known as getting your "tubes tied." Tubal ligation is done so you will not be able to get pregnant or have a baby. Although this procedure may be undone (reversed), it should be considered permanent and irreversible. If you want to have future pregnancies, you should not have this procedure. LET YOUR HEALTH CARE PROVIDER KNOW ABOUT:  Any allergies you have.  All medicines you are taking, including vitamins, herbs, eye drops, creams, and over-the-counter medicines. This includes any use of steroids, either by mouth or in cream form.  Previous problems you or members of your family have had with the use of anesthetics.  Any blood disorders you have.  Previous surgeries you have had.  Any medical conditions you may have.  Possibility of pregnancy, if this applies.  Any past pregnancies. RISKS AND COMPLICATIONS  Infection.  Bleeding.  Injury to surrounding organs.  Side effects from anesthetics.  Failure of the procedure.  Ectopic pregnancy.  Future regret about having the procedure done. BEFORE THE PROCEDURE  Ask your health care provider about:  Changing or stopping your regular medicines. This is especially important if you are taking diabetes medicines or blood thinners.  Taking medicines such as aspirin and ibuprofen. These medicines can thin your blood. Do not take these medicines before your procedure if your health care provider instructs you not to.  Follow instructions from your health care provider about eating and drinking restrictions.  Plan to have someone take you home after the procedure.  If you go home right after the procedure, plan to have someone  with you for 24 hours. PROCEDURE  You will be given one or more of the following:  A medicine that helps you relax (sedative).  A medicine that numbs the area (local anesthetic).  A medicine that makes you fall asleep (general anesthetic).  A medicine that is injected into an area of your body that numbs everything below the injection site (regional anesthetic).  If you have been given general anesthetic, a tube will be put down your throat to help you breathe.  Two small cuts (incisions) will be made in the lower abdominal area and near the belly button.  Your bladder may be emptied with a small tube (catheter).  Your abdomen will be inflated with a safe gas (carbon dioxide). This will help to give the surgeon room to operate and visualize, and it will help the surgeon to avoid other organs.  A thin, lighted tube (laparoscope) with a camera attached will be inserted into your abdomen through one of the incisions near the belly button. Other small instruments will be inserted through the other abdominal incision.  The fallopian tubes will be tied off or burned (cauterized), or they will be blocked with a clip, ring, or clamp. In many cases, a small portion in the center of each fallopian tube will also be removed.  After the fallopian tubes are blocked, the gas will be released from the abdomen.  The incisions will be closed with stitches (sutures).  A bandage (dressing) will be placed over the incisions. The procedure may vary among health care providers and hospitals. AFTER THE PROCEDURE  Your blood pressure, heart rate, breathing rate, and blood oxygen level   will be monitored often until the medicines you were given have worn off.  You will be given pain medicine as needed.  If you had general anesthetic, you may have some mild discomfort in your throat. This is from the breathing tube that was placed in your throat while you were sleeping.  You may experience discomfort in  the shoulder area from some trapped air between your liver and your diaphragm. This sensation is normal, and it will slowly go away on its own.  You will have some mild abdominal discomfort for 3--7 days.   This information is not intended to replace advice given to you by your health care provider. Make sure you discuss any questions you have with your health care provider.   Document Released: 03/17/2001 Document Revised: 04/25/2015 Document Reviewed: 03/21/2012 Elsevier Interactive Patient Education 2016 [employer]  

## 2016-04-12 NOTE — Progress Notes (Signed)
   Subjective:    Patient ID: Caitlin Sullivan, female    DOB: [birth year], 25 y.o.   MRN: [id number]  HPI Patient seen for contraception management   Review of Systems     Objective:   Physical Exam  Constitutional: She is oriented to person, place, and time. She appears well-developed and well-nourished.  Neurological: She is alert and oriented to person, place, and time.  Skin: Skin is warm. No rash noted. No erythema. No pallor.  Psychiatric: She has a normal mood and affect. Her behavior is normal. Judgment and thought content normal.       Assessment & Plan:  1.  Contraception management After discussing option with patient, she indicated that she would like interval BTL.  Tubal consent signed and patient will be scheduled in 2 weeks to meet with G[facility] surgeon.  Discussed no unprotected sex until tubal is done.

## 2016-05-03 ENCOUNTER — Encounter: Payer: Medicaid Other | Admitting: Obstetrics & Gynecology

## 2016-05-03 ENCOUNTER — Telehealth: Payer: Self-pay | Admitting: Obstetrics & Gynecology

## 2016-05-03 DIAGNOSIS — Z30011 Encounter for initial prescription of contraceptive pills: Secondary | ICD-10-CM

## 2016-05-03 NOTE — Telephone Encounter (Signed)
Want to see if she can get some Birth Control pills called in

## 2016-05-06 NOTE — Telephone Encounter (Signed)
Called Caitlin Sullivan and was unable to leave a message- heard phone ring many times , but no answering machine.

## 2016-05-15 NOTE — Telephone Encounter (Signed)
Called pt and left message stating that I am returning her call.  Please call back and state whether her medication request is to take the place of the plan of care and appointment which has been made. *Per chart review, pt is scheduled for clinic visit on 6/8 for surgical consult in order to have BTS scheduled. She missed appt on 5/12 for same.

## 2016-05-16 MED ORDER — NORGESTIMATE-ETH ESTRADIOL 0.25-35 MG-MCG PO TABS: 1.0000 | ORAL_TABLET | Freq: Every day | ORAL | Status: DC

## 2016-05-23 NOTE — Telephone Encounter (Signed)
Called patient & she states birth control pills were recently called in for her & she just wanted coverage until her surgery. Patient states she wants to make sure she can have a pap and testing that day as well. Told patient that is not a problem and we will see her then. Patient had no questions

## 2016-05-29 ENCOUNTER — Telehealth: Payer: Self-pay | Admitting: General Practice

## 2016-05-29 NOTE — Telephone Encounter (Signed)
Patient called and left message stating she recently started birth control pills and has been having some weird side effects. Called patient and she states she took the birth control pills for the first time last night around 630pm and woke up this morning with nausea and cramping & wanted to know if this was an expected side effect from the pills or if she was having some type of allergic reaction. Told patient nausea can happen for some people sometimes especially as you are adjusting to the medication. Recommended she take the medication with food & try taking it at different times of day such as morning or lunch. Told patient if it doesn't improve in several days she may call us back. Patient verbalized understanding & had no questions

## 2016-05-30 ENCOUNTER — Ambulatory Visit (INDEPENDENT_AMBULATORY_CARE_PROVIDER_SITE_OTHER): Payer: Medicaid Other | Admitting: Obstetrics & Gynecology

## 2016-05-30 ENCOUNTER — Other Ambulatory Visit (HOSPITAL_COMMUNITY)
Admission: RE | Admit: 2016-05-30 | Discharge: 2016-05-30 | Disposition: A | Discharge status: Home / Self Care | Payer: Medicaid Other | Source: Ambulatory Visit | Attending: Obstetrics & Gynecology | Admitting: Obstetrics & Gynecology

## 2016-05-30 VITALS — Wt 216.6 lb

## 2016-05-30 DIAGNOSIS — Z202 Contact with and (suspected) exposure to infections with a predominantly sexual mode of transmission: Secondary | ICD-10-CM | POA: Diagnosis not present

## 2016-05-30 DIAGNOSIS — Z113 Encounter for screening for infections with a predominantly sexual mode of transmission: Secondary | ICD-10-CM | POA: Insufficient documentation

## 2016-05-30 DIAGNOSIS — Z3009 Encounter for other general counseling and advice on contraception: Secondary | ICD-10-CM

## 2016-05-30 NOTE — Patient Instructions (Signed)
Laparoscopic Tubal Ligation Laparoscopic tubal ligation is a procedure that closes the fallopian tubes at a time other than right after childbirth. When the fallopian tubes are closed, the eggs that are released from the ovaries cannot enter the uterus, and sperm cannot reach the egg. Tubal ligation is also known as getting your "tubes tied." Tubal ligation is done so you will not be able to get pregnant or have a baby. Although this procedure may be undone (reversed), it should be considered permanent and irreversible. If you want to have future pregnancies, you should not have this procedure. LET YOUR HEALTH CARE PROVIDER KNOW ABOUT:  Any allergies you have.  All medicines you are taking, including vitamins, herbs, eye drops, creams, and over-the-counter medicines. This includes any use of steroids, either by mouth or in cream form.  Previous problems you or members of your family have had with the use of anesthetics.  Any blood disorders you have.  Previous surgeries you have had.  Any medical conditions you may have.  Possibility of pregnancy, if this applies.  Any past pregnancies. RISKS AND COMPLICATIONS  Infection.  Bleeding.  Injury to surrounding organs.  Side effects from anesthetics.  Failure of the procedure.  Ectopic pregnancy.  Future regret about having the procedure done. BEFORE THE PROCEDURE  Ask your health care provider about:  Changing or stopping your regular medicines. This is especially important if you are taking diabetes medicines or blood thinners.  Taking medicines such as aspirin and ibuprofen. These medicines can thin your blood. Do not take these medicines before your procedure if your health care provider instructs you not to.  Follow instructions from your health care provider about eating and drinking restrictions.  Plan to have someone take you home after the procedure.  If you go home right after the procedure, plan to have someone  with you for 24 hours. PROCEDURE  You will be given one or more of the following:  A medicine that helps you relax (sedative).  A medicine that numbs the area (local anesthetic).  A medicine that makes you fall asleep (general anesthetic).  A medicine that is injected into an area of your body that numbs everything below the injection site (regional anesthetic).  If you have been given general anesthetic, a tube will be put down your throat to help you breathe.  Two small cuts (incisions) will be made in the lower abdominal area and near the belly button.  Your bladder may be emptied with a small tube (catheter).  Your abdomen will be inflated with a safe gas (carbon dioxide). This will help to give the surgeon room to operate and visualize, and it will help the surgeon to avoid other organs.  A thin, lighted tube (laparoscope) with a camera attached will be inserted into your abdomen through one of the incisions near the belly button. Other small instruments will be inserted through the other abdominal incision.  The fallopian tubes will be tied off or burned (cauterized), or they will be blocked with a clip, ring, or clamp. In many cases, a small portion in the center of each fallopian tube will also be removed.  After the fallopian tubes are blocked, the gas will be released from the abdomen.  The incisions will be closed with stitches (sutures).  A bandage (dressing) will be placed over the incisions. The procedure may vary among health care providers and hospitals. AFTER THE PROCEDURE  Your blood pressure, heart rate, breathing rate, and blood oxygen level   will be monitored often until the medicines you were given have worn off.  You will be given pain medicine as needed.  If you had general anesthetic, you may have some mild discomfort in your throat. This is from the breathing tube that was placed in your throat while you were sleeping.  You may experience discomfort in  the shoulder area from some trapped air between your liver and your diaphragm. This sensation is normal, and it will slowly go away on its own.  You will have some mild abdominal discomfort for 3--7 days.   This information is not intended to replace advice given to you by your health care provider. Make sure you discuss any questions you have with your health care provider.   Document Released: 03/17/2001 Document Revised: 04/25/2015 Document Reviewed: 03/21/2012 Elsevier Interactive Patient Education 2016 [employer]  

## 2016-05-30 NOTE — Progress Notes (Signed)
Patient ID: Caitlin Sullivan, female   DOB: March 21, 1991, 25 y.o.   MRN: [id number]  Chief Complaint  Patient presents with  . Advice Only    BTS  wishes to schedule LBTL, signed medicaid consent  HPI Caitlin Sullivan is a 25 y.o. female.  [id number] No LMP recorded. Currently on OCP. Plans BTL HPI  Past Medical History  Diagnosis Date  . Umbilical hernia   . Hyperemesis arising during pregnancy   . Asthma   . Gall stones   . Iron deficiency anemia   . Heart murmur   . GERD (gastroesophageal reflux disease)     Past Surgical History  Procedure Laterality Date  . Pilonidal cyst / sinus excision    . Cholecystectomy    . Cholecystectomy N/A 09/22/2014    Procedure: LAPAROSCOPIC CHOLECYSTECTOMY;  Surgeon: Axel FillerArmando Ramirez, MD;  [facility];  Service: General;  Laterality: N/A;    Family History  Problem Relation Age of Onset  . Cancer Mother   . Hypertension Mother   . Diabetes Father   . Hypertension Father   . Hyperlipidemia Father   . Stroke Father   . Asthma Brother   . Multiple sclerosis Sister     Social History Social History  Substance Use Topics  . Smoking status: Former Smoker -- 0.25 packs/day    Types: Cigarettes    Quit date: 09/16/2014  . Smokeless tobacco: Never Used  . Alcohol Use: No    No Known Allergies  Current Outpatient Prescriptions  Medication Sig Dispense Refill  . ibuprofen (ADVIL,MOTRIN) 600 MG tablet Take 1 tablet (600 mg total) by mouth every 6 (six) hours. 30 tablet 0  . norgestimate-ethinyl estradiol (ORTHO-CYCLEN,SPRINTEC,PREVIFEM) 0.25-35 MG-MCG tablet Take 1 tablet by mouth daily. 1 Package 0  . Prenatal Vit-Fe Fumarate-FA (PRENATAL COMPLETE) 14-0.4 MG TABS Take 14 mg by mouth daily. 60 each 0   No current facility-administered medications for this visit.    Review of Systems Review of Systems  Gastrointestinal: Negative.   Genitourinary: Negative for vaginal bleeding, menstrual problem and pelvic pain.    Weight 216 lb 9.6 oz  (98.249 kg), unknown if currently breastfeeding.  Physical Exam Physical Exam  Data Reviewed   Assessment    Desires sterilization    Plan   25 y.o. [phone] with undesired fertility desires permanent sterilization. Risks and benefits of laparoscopic tubal sterilization procedure was discussed with the patient including permanence of method, bleeding, infection, injury to surrounding organs, anesthesia and need for additional procedures. Risk failure of 0.5-1% with increased risk of ectopic gestation if pregnancy occurs was also discussed with patient. Patient verbalized understanding and all questions were answered.  Currie ParisJames G. Debroah LoopArnold MD 05/30/2016          Caitlin Sullivan 05/30/2016, 4:05 PM

## 2016-05-31 ENCOUNTER — Encounter (HOSPITAL_COMMUNITY): Payer: Self-pay | Admitting: *Deleted

## 2016-05-31 LAB — HEPATITIS B SURFACE ANTIGEN: Hepatitis B Surface Ag: NEGATIVE

## 2016-05-31 LAB — HIV ANTIBODY (ROUTINE TESTING W REFLEX): HIV 1&2 Ab, 4th Generation: NONREACTIVE

## 2016-05-31 LAB — GC/CHLAMYDIA PROBE AMP (~~LOC~~) NOT AT ARMC
Chlamydia: NEGATIVE
Neisseria Gonorrhea: NEGATIVE

## 2016-05-31 LAB — RPR

## 2016-06-03 ENCOUNTER — Telehealth: Payer: Self-pay | Admitting: General Practice

## 2016-06-03 NOTE — Telephone Encounter (Signed)
Patient called and left message requesting test results. Called patient, no answer- left message stating we are trying to reach you to return your phone call. The labs you were requesting were all negative. If you have questions or concerns you may call us back at the clinics

## 2016-06-13 ENCOUNTER — Other Ambulatory Visit: Payer: Self-pay | Admitting: Obstetrics & Gynecology

## 2016-06-28 NOTE — Patient Instructions (Addendum)
Your procedure is scheduled on:  Tomorrow, July 02, 2016  [facility] at: 9:30 AM  Pick up the phone at the desk and dial [phone].  Call this number if you have problems the morning of surgery: [phone].  Remember:  Do NOT eat food or drink after:  Midnight Tonight  Do NOT smoke the day of surgery  Take these medicines the morning of surgery with a SIP OF WATER: None  Do NOT wear jewelry (body piercing), metal hair clips/bobby pins, make-up, or nail polish. Do NOT wear lotions, powders, or perfumes.  You may wear deodorant. Do NOT shave for 48 hours prior to surgery. Do NOT bring valuables to the hospital. Contacts, dentures, or bridgework may not be worn into surgery.  Have a responsible adult drive you home and stay with you for 24 hours after your procedure

## 2016-07-01 ENCOUNTER — Encounter (HOSPITAL_COMMUNITY)
Admission: RE | Admit: 2016-07-01 | Discharge: 2016-07-01 | Disposition: A | Discharge status: Home / Self Care | Payer: Medicaid Other | Source: Ambulatory Visit | Attending: Obstetrics & Gynecology | Admitting: Obstetrics & Gynecology

## 2016-07-01 ENCOUNTER — Encounter (HOSPITAL_COMMUNITY): Payer: Self-pay

## 2016-07-01 DIAGNOSIS — Z01812 Encounter for preprocedural laboratory examination: Secondary | ICD-10-CM | POA: Insufficient documentation

## 2016-07-01 HISTORY — DX: Other complications of anesthesia, initial encounter: T88.59XA

## 2016-07-01 HISTORY — DX: Adverse effect of unspecified anesthetic, initial encounter: T41.45XA

## 2016-07-01 LAB — CBC
HCT: 39 % (ref 36.0–46.0)
HEMOGLOBIN: 13 g/dL (ref 12.0–15.0)
MCH: 28.7 pg (ref 26.0–34.0)
MCHC: 33.3 g/dL (ref 30.0–36.0)
MCV: 86.1 fL (ref 78.0–100.0)
PLATELETS: 274 10*3/uL (ref 150–400)
RBC: 4.53 MIL/uL (ref 3.87–5.11)
RDW: 14.8 % (ref 11.5–15.5)
WBC: 8.8 10*3/uL (ref 4.0–10.5)

## 2016-07-02 ENCOUNTER — Encounter (HOSPITAL_COMMUNITY)
Admission: RE | Disposition: A | Discharge status: Home / Self Care | Payer: Self-pay | Source: Ambulatory Visit | Attending: Obstetrics & Gynecology

## 2016-07-02 ENCOUNTER — Encounter (HOSPITAL_COMMUNITY): Payer: Self-pay | Admitting: Obstetrics & Gynecology

## 2016-07-02 ENCOUNTER — Ambulatory Visit (HOSPITAL_COMMUNITY): Payer: Medicaid Other | Admitting: Anesthesiology

## 2016-07-02 ENCOUNTER — Ambulatory Visit (HOSPITAL_COMMUNITY)
Admission: RE | Admit: 2016-07-02 | Discharge: 2016-07-02 | Disposition: A | Discharge status: Home / Self Care | Payer: Medicaid Other | Source: Ambulatory Visit | Attending: Obstetrics & Gynecology | Admitting: Obstetrics & Gynecology

## 2016-07-02 ENCOUNTER — Encounter (HOSPITAL_COMMUNITY): Payer: Self-pay | Admitting: Anesthesiology

## 2016-07-02 DIAGNOSIS — Z302 Encounter for sterilization: Secondary | ICD-10-CM | POA: Insufficient documentation

## 2016-07-02 DIAGNOSIS — K219 Gastro-esophageal reflux disease without esophagitis: Secondary | ICD-10-CM | POA: Diagnosis not present

## 2016-07-02 DIAGNOSIS — F17210 Nicotine dependence, cigarettes, uncomplicated: Secondary | ICD-10-CM | POA: Insufficient documentation

## 2016-07-02 DIAGNOSIS — J45909 Unspecified asthma, uncomplicated: Secondary | ICD-10-CM | POA: Insufficient documentation

## 2016-07-02 HISTORY — PX: LAPAROSCOPIC TUBAL LIGATION: SHX1937

## 2016-07-02 LAB — PREGNANCY, URINE: PREG TEST UR: NEGATIVE

## 2016-07-02 SURGERY — LIGATION, FALLOPIAN TUBE, LAPAROSCOPIC
Anesthesia: General | Site: Abdomen | Laterality: Bilateral

## 2016-07-02 MED ORDER — MIDAZOLAM HCL 5 MG/5ML IJ SOLN
INTRAMUSCULAR | Status: DC | PRN
  Administered 2016-07-02: 2 mg via INTRAVENOUS

## 2016-07-02 MED ORDER — PROPOFOL 10 MG/ML IV BOLUS
INTRAVENOUS | Status: AC
  Filled 2016-07-02: qty 20

## 2016-07-02 MED ORDER — SCOPOLAMINE 1 MG/3DAYS TD PT72
1.0000 | MEDICATED_PATCH | Freq: Once | TRANSDERMAL | Status: DC
  Administered 2016-07-02: 1.5 mg via TRANSDERMAL

## 2016-07-02 MED ORDER — PROMETHAZINE HCL 25 MG/ML IJ SOLN
INTRAMUSCULAR | Status: AC
  Filled 2016-07-02: qty 1

## 2016-07-02 MED ORDER — MIDAZOLAM HCL 2 MG/2ML IJ SOLN
INTRAMUSCULAR | Status: AC
  Filled 2016-07-02: qty 2

## 2016-07-02 MED ORDER — ACETAMINOPHEN 160 MG/5ML PO SOLN
ORAL | Status: AC
  Administered 2016-07-02: 975 mg via ORAL
  Filled 2016-07-02: qty 40.6

## 2016-07-02 MED ORDER — DEXAMETHASONE SODIUM PHOSPHATE 10 MG/ML IJ SOLN
INTRAMUSCULAR | Status: AC
  Filled 2016-07-02: qty 1

## 2016-07-02 MED ORDER — LIDOCAINE HCL (CARDIAC) 20 MG/ML IV SOLN
INTRAVENOUS | Status: AC
  Filled 2016-07-02: qty 5

## 2016-07-02 MED ORDER — ONDANSETRON HCL 4 MG/2ML IJ SOLN
INTRAMUSCULAR | Status: AC
  Filled 2016-07-02: qty 2

## 2016-07-02 MED ORDER — HYDROMORPHONE HCL 1 MG/ML IJ SOLN
INTRAMUSCULAR | Status: AC
  Administered 2016-07-02: 0.5 mg via INTRAVENOUS
  Filled 2016-07-02: qty 1

## 2016-07-02 MED ORDER — KETOROLAC TROMETHAMINE 30 MG/ML IJ SOLN
INTRAMUSCULAR | Status: DC | PRN
  Administered 2016-07-02: 30 mg via INTRAVENOUS

## 2016-07-02 MED ORDER — OXYCODONE-ACETAMINOPHEN 5-325 MG PO TABS: 1.0000 | ORAL_TABLET | Freq: Four times a day (QID) | ORAL | Status: DC | PRN

## 2016-07-02 MED ORDER — OXYCODONE-ACETAMINOPHEN 5-325 MG PO TABS
ORAL_TABLET | ORAL | Status: AC
  Filled 2016-07-02: qty 2

## 2016-07-02 MED ORDER — GABAPENTIN 400 MG PO CAPS
800.0000 mg | ORAL_CAPSULE | Freq: Once | ORAL | Status: AC
  Administered 2016-07-02: 800 mg via ORAL
  Filled 2016-07-02: qty 2

## 2016-07-02 MED ORDER — ROCURONIUM BROMIDE 100 MG/10ML IV SOLN
INTRAVENOUS | Status: DC | PRN
  Administered 2016-07-02: 50 mg via INTRAVENOUS

## 2016-07-02 MED ORDER — PROMETHAZINE HCL 25 MG/ML IJ SOLN
6.2500 mg | INTRAMUSCULAR | Status: DC | PRN
  Administered 2016-07-02: 6.25 mg via INTRAVENOUS

## 2016-07-02 MED ORDER — BUPIVACAINE HCL (PF) 0.25 % IJ SOLN
INTRAMUSCULAR | Status: DC | PRN
  Administered 2016-07-02: 2 mL
  Administered 2016-07-02: 5 mL

## 2016-07-02 MED ORDER — SUGAMMADEX SODIUM 200 MG/2ML IV SOLN
INTRAVENOUS | Status: DC | PRN
  Administered 2016-07-02: 195.4 mg via INTRAVENOUS

## 2016-07-02 MED ORDER — HYDROMORPHONE HCL 1 MG/ML IJ SOLN
0.2500 mg | INTRAMUSCULAR | Status: DC | PRN
  Administered 2016-07-02 (×4): 0.5 mg via INTRAVENOUS

## 2016-07-02 MED ORDER — KETOROLAC TROMETHAMINE 30 MG/ML IJ SOLN
INTRAMUSCULAR | Status: AC
  Filled 2016-07-02: qty 1

## 2016-07-02 MED ORDER — LACTATED RINGERS IV SOLN: INTRAVENOUS | Status: DC

## 2016-07-02 MED ORDER — HYDROMORPHONE HCL 1 MG/ML IJ SOLN
INTRAMUSCULAR | Status: DC | PRN
  Administered 2016-07-02: 1 mg via INTRAVENOUS

## 2016-07-02 MED ORDER — FENTANYL CITRATE (PF) 100 MCG/2ML IJ SOLN
INTRAMUSCULAR | Status: DC | PRN
  Administered 2016-07-02: 100 ug via INTRAVENOUS
  Administered 2016-07-02: 150 ug via INTRAVENOUS
  Administered 2016-07-02 (×2): 50 ug via INTRAVENOUS

## 2016-07-02 MED ORDER — FENTANYL CITRATE (PF) 100 MCG/2ML IJ SOLN
INTRAMUSCULAR | Status: AC
  Filled 2016-07-02: qty 2

## 2016-07-02 MED ORDER — ONDANSETRON HCL 4 MG/2ML IJ SOLN
INTRAMUSCULAR | Status: DC | PRN
  Administered 2016-07-02: 4 mg via INTRAVENOUS

## 2016-07-02 MED ORDER — FENTANYL CITRATE (PF) 250 MCG/5ML IJ SOLN
INTRAMUSCULAR | Status: AC
  Filled 2016-07-02: qty 5

## 2016-07-02 MED ORDER — LACTATED RINGERS IV SOLN
INTRAVENOUS | Status: DC
  Administered 2016-07-02 (×2): via INTRAVENOUS

## 2016-07-02 MED ORDER — DEXAMETHASONE SODIUM PHOSPHATE 4 MG/ML IJ SOLN
INTRAMUSCULAR | Status: DC | PRN
  Administered 2016-07-02: 10 mg via INTRAVENOUS

## 2016-07-02 MED ORDER — SCOPOLAMINE 1 MG/3DAYS TD PT72
MEDICATED_PATCH | TRANSDERMAL | Status: AC
  Administered 2016-07-02: 1.5 mg via TRANSDERMAL
  Filled 2016-07-02: qty 1

## 2016-07-02 MED ORDER — BUPIVACAINE HCL (PF) 0.25 % IJ SOLN
INTRAMUSCULAR | Status: AC
  Filled 2016-07-02: qty 30

## 2016-07-02 MED ORDER — OXYCODONE-ACETAMINOPHEN 5-325 MG PO TABS
2.0000 | ORAL_TABLET | ORAL | Status: DC | PRN
  Administered 2016-07-02: 2 via ORAL

## 2016-07-02 MED ORDER — ACETAMINOPHEN 160 MG/5ML PO SOLN
975.0000 mg | Freq: Once | ORAL | Status: AC
  Administered 2016-07-02: 975 mg via ORAL

## 2016-07-02 MED ORDER — HYDROMORPHONE HCL 1 MG/ML IJ SOLN
INTRAMUSCULAR | Status: AC
  Filled 2016-07-02: qty 1

## 2016-07-02 MED ORDER — ROCURONIUM BROMIDE 100 MG/10ML IV SOLN
INTRAVENOUS | Status: AC
  Filled 2016-07-02: qty 1

## 2016-07-02 MED ORDER — LIDOCAINE HCL (CARDIAC) 20 MG/ML IV SOLN
INTRAVENOUS | Status: DC | PRN
  Administered 2016-07-02: 100 mg via INTRAVENOUS

## 2016-07-02 MED ORDER — SUGAMMADEX SODIUM 200 MG/2ML IV SOLN
INTRAVENOUS | Status: AC
  Filled 2016-07-02: qty 2

## 2016-07-02 MED ORDER — PROPOFOL 10 MG/ML IV BOLUS
INTRAVENOUS | Status: DC | PRN
  Administered 2016-07-02: 200 mg via INTRAVENOUS

## 2016-07-02 MED ORDER — MEPERIDINE HCL 25 MG/ML IJ SOLN: 6.2500 mg | INTRAMUSCULAR | Status: DC | PRN

## 2016-07-02 SURGICAL SUPPLY — 22 items
CATH ROBINSON RED A/P 16FR (CATHETERS) ×3 IMPLANT
CLIP FILSHIE TUBAL LIGA STRL (Clip) ×6 IMPLANT
CLOTH BEACON ORANGE TIMEOUT ST (SAFETY) ×3 IMPLANT
DERMABOND ADVANCED (GAUZE/BANDAGES/DRESSINGS)
DERMABOND ADVANCED .7 DNX12 (GAUZE/BANDAGES/DRESSINGS) IMPLANT
DRSG OPSITE POSTOP 3X4 (GAUZE/BANDAGES/DRESSINGS) ×3 IMPLANT
DURAPREP 26ML APPLICATOR (WOUND CARE) ×3 IMPLANT
GLOVE BIO SURGEON STRL SZ 6.5 (GLOVE) ×2 IMPLANT
GLOVE BIO SURGEONS STRL SZ 6.5 (GLOVE) ×1
GLOVE BIOGEL PI IND STRL 7.0 (GLOVE) ×2 IMPLANT
GLOVE BIOGEL PI INDICATOR 7.0 (GLOVE) ×4
GOWN STRL REUS W/TWL LRG LVL3 (GOWN DISPOSABLE) ×6 IMPLANT
NEEDLE INSUFFLATION 120MM (ENDOMECHANICALS) ×3 IMPLANT
PACK LAPAROSCOPY BASIN (CUSTOM PROCEDURE TRAY) ×3 IMPLANT
PAD TRENDELENBURG POSITION (MISCELLANEOUS) ×3 IMPLANT
SET IRRIG TUBING LAPAROSCOPIC (IRRIGATION / IRRIGATOR) IMPLANT
SUT VICRYL 0 UR6 27IN ABS (SUTURE) ×3 IMPLANT
SUT VICRYL 4-0 PS2 18IN ABS (SUTURE) ×3 IMPLANT
TOWEL OR 17X24 6PK STRL BLUE (TOWEL DISPOSABLE) ×6 IMPLANT
TROCAR XCEL DIL TIP R 11M (ENDOMECHANICALS) ×3 IMPLANT
WARMER LAPAROSCOPE (MISCELLANEOUS) ×3 IMPLANT
WATER STERILE IRR 1000ML POUR (IV SOLUTION) ×3 IMPLANT

## 2016-07-02 NOTE — Anesthesia Preprocedure Evaluation (Signed)
Anesthesia Evaluation  Patient identified by MRN, date of birth, ID band Patient awake    Reviewed: Allergy & Precautions, H&P , NPO status , Patient's Chart, lab work & pertinent test results  Airway Mallampati: II  TM Distance: >3 FB Neck ROM: Full    Dental no notable dental hx. (+) Teeth Intact, Dental Advisory Given   Pulmonary asthma , Current Smoker, former smoker,    Pulmonary exam normal breath sounds clear to auscultation       Cardiovascular negative cardio ROS  + Valvular Problems/Murmurs  Rhythm:Regular Rate:Normal     Neuro/Psych negative neurological ROS  negative psych ROS   GI/Hepatic Neg liver ROS, GERD  ,  Endo/Other  negative endocrine ROS  Renal/GU negative Renal ROS     Musculoskeletal   Abdominal   Peds  Hematology negative hematology ROS (+) anemia ,   Anesthesia Other Findings   Reproductive/Obstetrics negative OB ROS                             Anesthesia Physical  Anesthesia Plan  ASA: II  Anesthesia Plan: General   Post-op Pain Management:    Induction: Intravenous  Airway Management Planned: Oral ETT  Additional Equipment:   Intra-op Plan:   Post-operative Plan: Extubation in OR  Informed Consent: I have reviewed the patients History and Physical, chart, labs and discussed the procedure including the risks, benefits and alternatives for the proposed anesthesia with the patient or authorized representative who has indicated his/her understanding and acceptance.   Dental advisory given  Plan Discussed with: CRNA  Anesthesia Plan Comments:         Anesthesia Quick Evaluation

## 2016-07-02 NOTE — Op Note (Signed)
Caitlin Sullivan 07/02/2016  PREOPERATIVE DIAGNOSIS:  Undesired fertility  POSTOPERATIVE DIAGNOSIS:  Undesired fertility  PROCEDURE:  Laparoscopic Bilateral Tubal Sterilization using Filshie Clips   SURGEON: Adam PhenixJames G Arnold, MD   ANESTHESIA:  General endotracheal  COMPLICATIONS:  None immediate.  ESTIMATED BLOOD LOSS:  Less than 20 ml.  FLUIDS: 1000 ml LR.  URINE OUTPUT:  50 ml of clear urine.  INDICATIONS: 25 y.o. [id number]  with undesired fertility, desires permanent sterilization. Other reversible forms of contraception were discussed with patient; she declines all other modalities.  Risks of procedure discussed with patient including permanence of method, bleeding, infection, injury to surrounding organs and need for additional procedures including laparotomy, risk of regret.  Failure risk of 0.5-1% with increased risk of ectopic gestation if pregnancy occurs was also discussed with patient.      FINDINGS:  Normal uterus, tubes, and ovaries.  TECHNIQUE:  The patient was taken to the operating room where general anesthesia was obtained without difficulty.  She was then placed in the dorsal lithotomy position and prepared and draped in sterile fashion.  After an adequate timeout was performed, a bivalved speculum was then placed in the patient's vagina, and the anterior lip of cervix grasped with the single-tooth tenaculum.  The uterine manipulator was then advanced into the uterus.  The speculum was removed from the vagina.  Attention was then turned to the patient's abdomen where a 11-mm skin incision was made in the umbilical fold. I was unable to confirm correct placement of the Veress needle for insufflation and placement of the Optiview trocar was difficult as well due to abdominal obesity. A 5 mm Optiview port was placed in the LUQ and the scope was used. No adhesions or injury were seen so the 11 mm trocar was place with visualization with no difficulty.The abdomen was then insufflated  with carbon dioxide gas and adequate pneumoperitoneum was obtained.  A survey of the patient's pelvis and abdomen revealed entirely normal anatomy.  The fallopian tubes were observed and found to be normal in appearance. The Filshie clip applicator was placed through the operative port, and a Filshie clip was placed on the right fallopian tube ,about 2 cm from the cornual attachment, with care given to incorporate the underlying mesosalpinx.  A similar process was carried out on the contralateral side allowing for bilateral tubal sterilization.   Good hemostasis was noted overall. The instruments were then removed from the patient's abdomen and the fascial incision was repaired with 0 Vicryl, and  both sites were closed with Dermabond.  The uterine manipulator and the tenaculum were removed from the vagina without complications. The patient tolerated the procedure well.  Sponge, lap, and needle counts were correct times two.  The patient was then taken to the recovery room awake, extubated and in stable condition.  Adam PhenixJames G Arnold, MD 07/02/2016 12:08 PM

## 2016-07-02 NOTE — Transfer of Care (Signed)
Immediate Anesthesia Transfer of Care Note  Patient: Caitlin SlimNancy Purdum  Procedure(s) Performed: Procedure(s): LAPAROSCOPIC BILATERAL TUBAL LIGATION (Bilateral)  Patient Location: PACU  Anesthesia Type:General  Level of Consciousness: awake, alert  and oriented  Airway & Oxygen Therapy: Patient Spontanous Breathing and Patient connected to nasal cannula oxygen  Post-op Assessment: Report given to RN, Post -op Vital signs reviewed and stable and Patient moving all extremities  Post vital signs: Reviewed and stable  Last Vitals:  Filed Vitals:   07/02/16 0959  BP: 118/69  Pulse: 52  Temp: 37.1 C  Resp: 16    Last Pain: There were no vitals filed for this visit.    Patients Stated Pain Goal: 3 (07/02/16 0959)  Complications: No apparent anesthesia complications

## 2016-07-02 NOTE — Anesthesia Procedure Notes (Signed)
Procedure Name: Intubation Date/Time: 07/02/2016 10:54 AM Performed by: Junious SilkGILBERT, Lamberto Dinapoli Pre-anesthesia Checklist: Patient identified, Emergency Drugs available, Suction available, Patient being monitored and Timeout performed Patient Re-evaluated:Patient Re-evaluated prior to inductionOxygen Delivery Method: Circle system utilized Preoxygenation: Pre-oxygenation with 100% oxygen Intubation Type: IV induction Ventilation: Mask ventilation without difficulty Laryngoscope Size: Miller and 2 Grade View: Grade I Tube type: Oral Tube size: 7.0 mm Number of attempts: 1 Airway Equipment and Method: Stylet Placement Confirmation: ETT inserted through vocal cords under direct vision,  positive ETCO2,  CO2 detector and breath sounds checked- equal and bilateral Secured at: 20 cm Tube secured with: Tape Dental Injury: Teeth and Oropharynx as per pre-operative assessment

## 2016-07-02 NOTE — H&P (Signed)
Caitlin Sullivan is an 25 y.o. female. Cc: desires sterilization, [id number] No LMP recorded., states LMP 06/08/16 No intercourse since LMP, stopped OCP one week ago, requests permanent sterilization, scheduled for LBTL today.     No LMP recorded.    Past Medical History  Diagnosis Date  . Umbilical hernia   . Hyperemesis arising during pregnancy   . Asthma   . Gall stones   . Iron deficiency anemia   . Heart murmur   . GERD (gastroesophageal reflux disease)     with pregnancy  . Complication of anesthesia     wakes up combative, fearful of being abducted, needs mother immediately after waking up    Past Surgical History  Procedure Laterality Date  . Pilonidal cyst / sinus excision    . Cholecystectomy    . Cholecystectomy N/A 09/22/2014    Procedure: LAPAROSCOPIC CHOLECYSTECTOMY;  Surgeon: Axel FillerArmando Ramirez, MD;  [facility];  Service: General;  Laterality: N/A;    Family History  Problem Relation Age of Onset  . Cancer Mother   . Hypertension Mother   . Diabetes Father   . Hypertension Father   . Hyperlipidemia Father   . Stroke Father   . Asthma Brother   . Multiple sclerosis Sister     Social History:  reports that she has been smoking Cigarettes.  She has a 1.25 pack-year smoking history. She has never used smokeless tobacco. She reports that she does not drink alcohol or use illicit drugs.  Allergies: No Known Allergies  Prescriptions prior to admission  Medication Sig Dispense Refill Last Dose  . norgestimate-ethinyl estradiol (ORTHO-CYCLEN,SPRINTEC,PREVIFEM) 0.25-35 MG-MCG tablet Take 1 tablet by mouth daily. 1 Package 0 06/18/2016    Review of Systems  Constitutional: Negative.   Respiratory: Negative.   Cardiovascular: Negative.   Gastrointestinal: Negative.   Genitourinary: Negative.     Blood pressure 118/69, pulse 52, temperature 98.7 F (37.1 C), temperature source Oral, resp. rate 16, SpO2 99 %, unknown if currently breastfeeding. Physical Exam   Constitutional: She is oriented to person, place, and time. She appears well-developed. No distress.  Cardiovascular: Normal rate.   GI: Soft. She exhibits no distension. There is no tenderness.  Neurological: She is alert and oriented to person, place, and time.  Psychiatric: She has a normal mood and affect. Her behavior is normal.    Results for orders placed or performed during the hospital encounter of 07/02/16 (from the past 24 hour(s))  Pregnancy, urine     Status: None   Collection Time: 07/02/16  9:30 AM  Result Value Ref Range   Preg Test, Ur NEGATIVE NEGATIVE   CBC    Component Value Date/Time   WBC 8.8 07/01/2016 0942   RBC 4.53 07/01/2016 0942   HGB 13.0 07/01/2016 0942   HGB 12.8 06/15/2015   HCT 39.0 07/01/2016 0942   HCT 37 06/15/2015   PLT 274 07/01/2016 0942   PLT 281 06/15/2015   MCV 86.1 07/01/2016 0942   MCH 28.7 07/01/2016 0942   MCHC 33.3 07/01/2016 0942   RDW 14.8 07/01/2016 0942   LYMPHSABS 2.0 09/26/2014 1152   MONOABS 0.5 09/26/2014 1152   EOSABS 0.1 09/26/2014 1152   BASOSABS 0.0 09/26/2014 1152     No results found.  Assessment/Plan: 25 y.o. [id number] with undesired fertility desires permanent sterilization. Risks and benefits of laparoscopic tubal sterilization procedure was discussed with the patient including permanence of method, bleeding, infection, injury to surrounding organs, anesthesia and need for additional procedures.  Risk failure of 0.5-1% with increased risk of ectopic gestation if pregnancy occurs was also discussed with patient. Patient verbalized understanding and all questions were answered.     ARNOLD,JAMES 07/02/2016, 10:15 AM

## 2016-07-02 NOTE — Discharge Instructions (Signed)
Postpartum Tubal Ligation, Care After Refer to this sheet in the next few weeks. These instructions provide you with information about caring for yourself after your procedure. Your health care provider may also give you more specific instructions. Your treatment has been planned according to current medical practices, but problems sometimes occur. Call your health care provider if you have any problems or questions after your procedure. WHAT TO EXPECT AFTER THE PROCEDURE After your procedure, it is common to have:  Sore throat.  Soreness at the incision site.  Mild cramping.  Tiredness.  Mild nausea or vomiting. HOME CARE INSTRUCTIONS  Rest for the remainder of the day.  Take medicines only as directed by your health care provider. These include over-the-counter medicines and prescription medicines. Do not take aspirin, which can cause bleeding.  Over the next few days, gradually return to your normal activities and your normal diet.  Avoid sexual intercourse for 2 weeks or as directed by your health care provider.  Do not drive or operate heavy machinery while taking pain medicine.  Do not lift anything that is heavier than 5 lb (2.3 kg) for 2 weeks or as directed by your health care provider.  Do not take baths. Take showers only. Ask your health care provider when you can start taking baths.  Take your temperature twice each day and write it down.  Try to have help for the first 7-10 days for your household needs.  There are many different ways to close and cover an incision, including stitches (sutures), skin glue, and adhesive strips. Follow instructions from your health care provider about:  Incision care.  Bandage (dressing) changes and removal.  Incision closure removal.  Check your incision area every day for signs of infection. Watch for:  Redness, swelling, or pain.  Fluid, blood, or pus.  Keep all follow-up visits as directed by your health care  provider. SEEK MEDICAL CARE IF:  You have redness, swelling, or increasing pain in your incision area.  You have fluid or pus coming from your incision for longer than 1 day.  You notice a bad smell coming from your incision or your dressing.  The edges of your incision break open after the sutures have been removed.  Your pain does not decrease after 2-3 days.  You have a rash.  You repeatedly become dizzy or light-headed.  You have a reaction to your medicine.  Your pain medicine is not helping.  You are constipated. SEEK IMMEDIATE MEDICAL CARE IF:   You have a fever.  You faint.  You have increasing pain in your abdomen.  You have bleeding or drainage from your suture sites or your vagina after surgery.  You have shortness of breath or have difficulty breathing.  You have chest pain or leg pain.  You have ongoing nausea, vomiting, or diarrhea.   This information is not intended to replace advice given to you by your health care provider. Make sure you discuss any questions you have with your health care provider.   Document Released: 06/09/2012 Document Revised: 04/25/2015 Document Reviewed: 06/09/2012 Elsevier Interactive Patient Education 2016 [employer]  Post Anesthesia Home Care Instructions  Activity: Get plenty of rest for the remainder of the day. A responsible adult should stay with you for 24 hours following the procedure.  For the next 24 hours, DO NOT: -Drive a car -Advertising copywriterperate machinery -Drink alcoholic beverages -Take any medication unless instructed by your physician -Make any legal decisions or sign important papers.  Meals:  Start with liquid foods such as gelatin or soup. Progress to regular foods as tolerated. Avoid greasy, spicy, heavy foods. If nausea and/or vomiting occur, drink only clear liquids until the nausea and/or vomiting subsides. Call your physician if vomiting continues.  Special Instructions/Symptoms: Your throat may feel  dry or sore from the anesthesia or the breathing tube placed in your throat during surgery. If this causes discomfort, gargle with warm salt water. The discomfort should disappear within 24 hours.  If you had a scopolamine patch placed behind your ear for the management of post- operative nausea and/or vomiting:  1. The medication in the patch is effective for 72 hours, after which it should be removed.  Wrap patch in a tissue and discard in the trash. Wash hands thoroughly with soap and water. 2. You may remove the patch earlier than 72 hours if you experience unpleasant side effects which may include dry mouth, dizziness or visual disturbances. 3. Avoid touching the patch. Wash your hands with soap and water after contact with the patch.

## 2016-07-02 NOTE — Anesthesia Postprocedure Evaluation (Signed)
Anesthesia Post Note  Patient: Caitlin Sullivan  Procedure(s) Performed: Procedure(s) (LRB): LAPAROSCOPIC BILATERAL TUBAL LIGATION (Bilateral)  Patient location during evaluation: PACU Anesthesia Type: General Level of consciousness: awake Pain management: pain level controlled Vital Signs Assessment: post-procedure vital signs reviewed and stable Respiratory status: spontaneous breathing Cardiovascular status: stable Postop Assessment: no signs of nausea or vomiting Anesthetic complications: no     Last Vitals:  Filed Vitals:   07/02/16 1245 07/02/16 1300  BP: 139/83 133/86  Pulse: 51 56  Temp:    Resp: 9 18    Last Pain:  Filed Vitals:   07/02/16 1317  PainSc: 5    Pain Goal: Patients Stated Pain Goal: 3 (07/02/16 1315)               Caitlin Sullivan,Caitlin Sullivan

## 2016-07-03 ENCOUNTER — Telehealth: Payer: Self-pay

## 2016-07-03 NOTE — Telephone Encounter (Signed)
Pt called into office today stating she has been experiencing a lot of pain since having a tubal on 07/02/2016 which was performed by Dr.Arther. Her pain level is around an 8. She stated she can barely move around and is demanding to speak to Dr.Arnold about her symptoms. I advised patient to come to [facility] to be check out since she was having so much pain. Patient she did not want to find ride to come to the hospital at this time. I advised patient I would try and contact Dr.Arnold since patient is demanding to speak with him at this time. Dr..Arnold was the attending today so I was able to speak with him concerning patient concerns. Dr.Arnold stated he would give her a call today.

## 2016-07-04 ENCOUNTER — Encounter (HOSPITAL_COMMUNITY): Payer: Self-pay | Admitting: Obstetrics & Gynecology

## 2017-02-14 ENCOUNTER — Emergency Department (HOSPITAL_COMMUNITY)
Admission: EM | Admit: 2017-02-14 | Discharge: 2017-02-14 | Disposition: A | Discharge status: Home / Self Care | Payer: No Typology Code available for payment source | Attending: Emergency Medicine | Admitting: Emergency Medicine

## 2017-02-14 ENCOUNTER — Encounter (HOSPITAL_COMMUNITY): Payer: Self-pay

## 2017-02-14 ENCOUNTER — Emergency Department (HOSPITAL_COMMUNITY): Payer: No Typology Code available for payment source

## 2017-02-14 DIAGNOSIS — F17210 Nicotine dependence, cigarettes, uncomplicated: Secondary | ICD-10-CM | POA: Diagnosis not present

## 2017-02-14 DIAGNOSIS — J45909 Unspecified asthma, uncomplicated: Secondary | ICD-10-CM | POA: Diagnosis not present

## 2017-02-14 DIAGNOSIS — Y92410 Unspecified street and highway as the place of occurrence of the external cause: Secondary | ICD-10-CM | POA: Diagnosis not present

## 2017-02-14 DIAGNOSIS — M25562 Pain in left knee: Secondary | ICD-10-CM | POA: Diagnosis not present

## 2017-02-14 DIAGNOSIS — S199XXA Unspecified injury of neck, initial encounter: Secondary | ICD-10-CM | POA: Diagnosis present

## 2017-02-14 DIAGNOSIS — Y939 Activity, unspecified: Secondary | ICD-10-CM | POA: Diagnosis not present

## 2017-02-14 DIAGNOSIS — Y999 Unspecified external cause status: Secondary | ICD-10-CM | POA: Diagnosis not present

## 2017-02-14 DIAGNOSIS — S161XXA Strain of muscle, fascia and tendon at neck level, initial encounter: Secondary | ICD-10-CM | POA: Diagnosis not present

## 2017-02-14 MED ORDER — IBUPROFEN 400 MG PO TABS
600.0000 mg | ORAL_TABLET | Freq: Once | ORAL | Status: AC
  Administered 2017-02-14: 600 mg via ORAL
  Filled 2017-02-14: qty 1
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
## 2017-02-14 NOTE — Discharge Instructions (Signed)
If you were given medicines take as directed.  If you are on coumadin or contraceptives realize their levels and effectiveness is altered by many different medicines.  If you have any reaction (rash, tongues swelling, other) to the medicines stop taking and see a physician.    If your blood pressure was elevated in the ER make sure you follow up for management with a primary doctor or return for chest pain, shortness of breath or stroke symptoms.  Please follow up as directed and return to the ER or see a physician for new or worsening symptoms.  Thank you. Vitals:   02/14/17 1953  BP: 124/74  Pulse: 78  Resp: 18  Temp: 97.9 F (36.6 C)  TempSrc: Oral  SpO2: 100%

## 2017-02-14 NOTE — ED Notes (Signed)
Pt A&Ox4, ambulatory at d/c with steady gait, NAD 
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
## 2017-02-18 ENCOUNTER — Ambulatory Visit (HOSPITAL_COMMUNITY)
Admission: EM | Admit: 2017-02-18 | Discharge: 2017-02-18 | Disposition: A | Discharge status: Home / Self Care | Payer: Medicaid Other | Attending: Family Medicine | Admitting: Family Medicine

## 2017-02-18 ENCOUNTER — Encounter (HOSPITAL_COMMUNITY): Payer: Self-pay | Admitting: Emergency Medicine

## 2017-02-18 DIAGNOSIS — S93401A Sprain of unspecified ligament of right ankle, initial encounter: Secondary | ICD-10-CM

## 2017-02-18 MED ORDER — NAPROXEN 500 MG PO TABS: 500.0000 mg | ORAL_TABLET | Freq: Two times a day (BID) | ORAL | 0 refills | Status: DC
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
## 2017-04-15 ENCOUNTER — Ambulatory Visit (HOSPITAL_COMMUNITY)
Admission: EM | Admit: 2017-04-15 | Discharge: 2017-04-15 | Disposition: A | Discharge status: Home / Self Care | Payer: Medicaid Other | Attending: Internal Medicine | Admitting: Internal Medicine

## 2017-04-15 ENCOUNTER — Ambulatory Visit (INDEPENDENT_AMBULATORY_CARE_PROVIDER_SITE_OTHER): Payer: Medicaid Other

## 2017-04-15 ENCOUNTER — Encounter (HOSPITAL_COMMUNITY): Payer: Self-pay | Admitting: Family Medicine

## 2017-04-15 DIAGNOSIS — J3489 Other specified disorders of nose and nasal sinuses: Secondary | ICD-10-CM

## 2017-04-15 DIAGNOSIS — R0981 Nasal congestion: Secondary | ICD-10-CM | POA: Diagnosis not present
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
## 2017-04-15 NOTE — Discharge Instructions (Signed)
X-rays of the nose and face appear to be normal. No evidence of broken nose, fluid or blood in the sinuses. The nasal tissue appears to be swollen such as seen with colds and allergies. Recommend using Afrin nasal spray as directed for 3 days to see if that helps. May also use saline nasal spray as often as needed and try Sudafed PE 10 mg every 4 hours as a decongestant. If this tablet is not working well in the first 2-3 days to stop it.

## 2017-09-22 ENCOUNTER — Emergency Department (HOSPITAL_COMMUNITY)
Admission: EM | Admit: 2017-09-22 | Discharge: 2017-09-22 | Discharge status: Against Medical Advice | Payer: Self-pay | Attending: Emergency Medicine | Admitting: Emergency Medicine

## 2017-09-22 ENCOUNTER — Encounter (HOSPITAL_COMMUNITY): Payer: Self-pay

## 2017-09-22 DIAGNOSIS — Z5321 Procedure and treatment not carried out due to patient leaving prior to being seen by health care provider: Secondary | ICD-10-CM | POA: Insufficient documentation

## 2017-09-22 DIAGNOSIS — N898 Other specified noninflammatory disorders of vagina: Secondary | ICD-10-CM | POA: Insufficient documentation

## 2017-09-22 LAB — URINALYSIS, ROUTINE W REFLEX MICROSCOPIC
BILIRUBIN URINE: NEGATIVE
GLUCOSE, UA: NEGATIVE mg/dL
HGB URINE DIPSTICK: NEGATIVE
Ketones, ur: NEGATIVE mg/dL
NITRITE: NEGATIVE
PH: 7 (ref 5.0–8.0)
Protein, ur: NEGATIVE mg/dL
SPECIFIC GRAVITY, URINE: 1.021 (ref 1.005–1.030)

## 2017-09-22 LAB — POC URINE PREG, ED: Preg Test, Ur: NEGATIVE

## 2017-09-22 NOTE — ED Notes (Signed)
Pt came and complained about the wait, and said she was leaving because it was too long.

## 2017-09-22 NOTE — ED Triage Notes (Signed)
Patient c/o dysuria, urinary frequency, lower abdominal cramping and vaginal discharge x 2 days.

## 2017-09-25 ENCOUNTER — Encounter (HOSPITAL_COMMUNITY): Payer: Self-pay | Admitting: Emergency Medicine

## 2017-09-25 ENCOUNTER — Ambulatory Visit (HOSPITAL_COMMUNITY)
Admission: EM | Admit: 2017-09-25 | Discharge: 2017-09-25 | Disposition: A | Discharge status: Home / Self Care | Payer: Self-pay | Attending: Family Medicine | Admitting: Family Medicine

## 2017-09-25 DIAGNOSIS — R350 Frequency of micturition: Secondary | ICD-10-CM

## 2017-09-25 DIAGNOSIS — Z3202 Encounter for pregnancy test, result negative: Secondary | ICD-10-CM

## 2017-09-25 DIAGNOSIS — Z87891 Personal history of nicotine dependence: Secondary | ICD-10-CM | POA: Insufficient documentation

## 2017-09-25 DIAGNOSIS — N898 Other specified noninflammatory disorders of vagina: Secondary | ICD-10-CM | POA: Insufficient documentation

## 2017-09-25 LAB — POCT URINALYSIS DIP (DEVICE)
BILIRUBIN URINE: NEGATIVE
GLUCOSE, UA: NEGATIVE mg/dL
KETONES UR: 40 mg/dL — AB
LEUKOCYTES UA: NEGATIVE
Nitrite: NEGATIVE
Protein, ur: NEGATIVE mg/dL
Urobilinogen, UA: 0.2 mg/dL (ref 0.0–1.0)
pH: 6.5 (ref 5.0–8.0)

## 2017-09-25 LAB — POCT PREGNANCY, URINE: Preg Test, Ur: NEGATIVE

## 2017-09-25 MED ORDER — AZITHROMYCIN 250 MG PO TABS
1000.0000 mg | ORAL_TABLET | Freq: Once | ORAL | Status: AC
  Administered 2017-09-25: 1000 mg via ORAL

## 2017-09-25 MED ORDER — METRONIDAZOLE 500 MG PO TABS: 500.0000 mg | ORAL_TABLET | Freq: Two times a day (BID) | ORAL | 0 refills | Status: DC

## 2017-09-25 MED ORDER — AZITHROMYCIN 250 MG PO TABS
ORAL_TABLET | ORAL | Status: AC
  Filled 2017-09-25: qty 4

## 2017-09-25 NOTE — Discharge Instructions (Signed)
We will have final report of the lab results in 2 days. You can also check this out on the web by going to [employer]

## 2017-09-25 NOTE — ED Provider Notes (Signed)
[facility]   [id number] 09/25/17 Arrival Time: 1831   SUBJECTIVE:  Helane Briceno is a 26 y.o. female who presents to the urgent care with complaint of vaginal discharge and urinary frequency x 2 days   Patient had intercourse 3 days ago.  Patient had recurrent UTIs as a child.  No nausea, flank pain, hematuria, or fever.   Past Medical History:  Diagnosis Date  . Asthma   . Complication of anesthesia    wakes up combative, fearful of being abducted, needs mother immediately after waking up  . Gall stones   . GERD (gastroesophageal reflux disease)    with pregnancy  . Heart murmur   . Hyperemesis arising during pregnancy   . Iron deficiency anemia   . Umbilical hernia    Family History  Problem Relation Age of Onset  . Cancer Mother   . Hypertension Mother   . Diabetes Father   . Hypertension Father   . Hyperlipidemia Father   . Stroke Father   . Asthma Brother   . Multiple sclerosis Sister    Social History   Social History  . Marital status: Single    Spouse name: N/A  . Number of children: N/A  . Years of education: N/A   Occupational History  . Not on file.   Social History Main Topics  . Smoking status: Former Smoker    Packs/day: 0.25    Years: 5.00    Types: Cigarettes  . Smokeless tobacco: Never Used  . Alcohol use No  . Drug use: No  . Sexual activity: No   Other Topics Concern  . Not on file   Social History Narrative  . No narrative on file   No outpatient prescriptions have been marked as taking for the 09/25/17 encounter [facility] Encounter).   No Known Allergies    ROS: As per HPI, remainder of ROS negative.   OBJECTIVE:   Vitals:   09/25/17 1853  BP: 135/81  Pulse: (!) 56  Resp: 18  Temp: 98.8 F (37.1 C)  TempSrc: Oral  SpO2: 99%  Weight: 213 lb (96.6 kg)  Height:  (1.676 m)     General appearance: alert; no distress Eyes: PERRL; EOMI; conjunctiva normal HENT: normocephalic; atraumatic; TMs  normal, canal normal, external ears normal without trauma; nasal mucosa normal; oral mucosa normal Neck: supple Abdomen: soft,  Mild lower abd tenderness; bowel sounds normal; no masses or organomegaly; no guarding or rebound tenderness Back: no CVA tenderness Extremities: no cyanosis or edema; symmetrical with no gross deformities Skin: warm and dry Neurologic: normal gait; grossly normal Psychological: alert and cooperative; normal mood and affect      Labs:  Results for orders placed or performed during the hospital encounter of 09/25/17  POCT urinalysis dip (device)  Result Value Ref Range   Glucose, UA NEGATIVE NEGATIVE mg/dL   Bilirubin Urine NEGATIVE NEGATIVE   Ketones, ur 40 (A) NEGATIVE mg/dL   Specific Gravity, Urine >=1.030 1.005 - 1.030   Hgb urine dipstick TRACE (A) NEGATIVE   pH 6.5 5.0 - 8.0   Protein, ur NEGATIVE NEGATIVE mg/dL   Urobilinogen, UA 0.2 0.0 - 1.0 mg/dL   Nitrite NEGATIVE NEGATIVE   Leukocytes, UA NEGATIVE NEGATIVE  Pregnancy, urine POC  Result Value Ref Range   Preg Test, Ur NEGATIVE NEGATIVE    Labs Reviewed  POCT URINALYSIS DIP (DEVICE) - Abnormal; Notable for the following:       Result Value   Ketones, ur  40 (*)    Hgb urine dipstick TRACE (*)    All other components within normal limits  URINE CULTURE  POCT PREGNANCY, URINE  URINE CYTOLOGY ANCILLARY ONLY    No results found.     ASSESSMENT & PLAN:  1. Vaginal discharge     Meds ordered this encounter  Medications  . azithromycin (ZITHROMAX) tablet 1,000 mg  . metroNIDAZOLE (FLAGYL) 500 MG tablet    Sig: Take 1 tablet (500 mg total) by mouth 2 (two) times daily.    Dispense:  14 tablet    Refill:  0    Reviewed expectations re: course of current medical issues. Questions answered. Outlined signs and symptoms indicating need for more acute intervention. Patient verbalized understanding. After Visit Summary given.    Procedures:      Elvina Sidle,  MD 09/25/17 1947

## 2017-09-25 NOTE — ED Triage Notes (Signed)
Pt sts vaginal discharge and urinary frequency x 2 days

## 2017-09-26 LAB — URINE CYTOLOGY ANCILLARY ONLY
Chlamydia: POSITIVE — AB
Neisseria Gonorrhea: POSITIVE — AB
Trichomonas: POSITIVE — AB

## 2017-09-27 ENCOUNTER — Encounter (HOSPITAL_COMMUNITY): Payer: Self-pay

## 2017-09-27 ENCOUNTER — Ambulatory Visit (HOSPITAL_COMMUNITY)
Admission: EM | Admit: 2017-09-27 | Discharge: 2017-09-27 | Disposition: A | Discharge status: Home / Self Care | Payer: Self-pay | Attending: Urgent Care | Admitting: Urgent Care

## 2017-09-27 DIAGNOSIS — A749 Chlamydial infection, unspecified: Secondary | ICD-10-CM | POA: Insufficient documentation

## 2017-09-27 DIAGNOSIS — A599 Trichomoniasis, unspecified: Secondary | ICD-10-CM | POA: Insufficient documentation

## 2017-09-27 DIAGNOSIS — Z7251 High risk heterosexual behavior: Secondary | ICD-10-CM

## 2017-09-27 DIAGNOSIS — K429 Umbilical hernia without obstruction or gangrene: Secondary | ICD-10-CM | POA: Insufficient documentation

## 2017-09-27 DIAGNOSIS — Z9049 Acquired absence of other specified parts of digestive tract: Secondary | ICD-10-CM | POA: Insufficient documentation

## 2017-09-27 DIAGNOSIS — Z9889 Other specified postprocedural states: Secondary | ICD-10-CM | POA: Insufficient documentation

## 2017-09-27 DIAGNOSIS — A549 Gonococcal infection, unspecified: Secondary | ICD-10-CM | POA: Insufficient documentation

## 2017-09-27 LAB — URINE CULTURE

## 2017-09-27 MED ORDER — CEFTRIAXONE SODIUM 250 MG IJ SOLR
250.0000 mg | Freq: Once | INTRAMUSCULAR | Status: AC
  Administered 2017-09-27: 250 mg via INTRAMUSCULAR

## 2017-09-27 MED ORDER — AZITHROMYCIN 250 MG PO TABS
ORAL_TABLET | ORAL | Status: AC
  Filled 2017-09-27: qty 4

## 2017-09-27 MED ORDER — LIDOCAINE HCL (PF) 1 % IJ SOLN
INTRAMUSCULAR | Status: AC
  Filled 2017-09-27: qty 2

## 2017-09-27 MED ORDER — AZITHROMYCIN 250 MG PO TABS
1000.0000 mg | ORAL_TABLET | Freq: Once | ORAL | Status: AC
  Administered 2017-09-27: 1000 mg via ORAL

## 2017-09-27 MED ORDER — CEFTRIAXONE SODIUM 250 MG IJ SOLR
INTRAMUSCULAR | Status: AC
  Filled 2017-09-27: qty 250

## 2017-09-27 MED ORDER — FLUCONAZOLE 150 MG PO TABS: 150.0000 mg | ORAL_TABLET | ORAL | 0 refills | Status: DC

## 2017-09-27 NOTE — ED Provider Notes (Signed)
MRN: 1992  Subjective:   Caitlin Sullivan is a 26 y.o. female presenting for chief complaint of STD Treatment  Patient is presenting for follow up on STI treatment and lab results. Last OV 09/25/2017, was treated empirically for chlamydia and trichomonas. Per patient, she thought this was just for UTI so when she got a call about positive STI results she was very upset. She presents today wanting treatment again because she has continued to have sex with the same individual. Denies fever, n/v, abdominal or pelvic pain, dysuria, hematuria, urinary frequency, vaginal discharge, genital rashes.    Caitlin Sullivan is not currently taking any medications and has No Known Allergies.  Caitlin Sullivan  has a past medical history of Asthma; Complication of anesthesia; Gall stones; GERD (gastroesophageal reflux disease); Heart murmur; Hyperemesis arising during pregnancy; Iron deficiency anemia; and Umbilical hernia. Also  has a past surgical history that includes Pilonidal cyst / sinus excision; Cholecystectomy; Cholecystectomy (N/A, 09/22/2014); and Laparoscopic tubal ligation (Bilateral, 07/02/2016).  Objective:   Vitals: BP (!) 157/101 (BP Location: Right Arm)   Resp 16   LMP 09/03/2017   SpO2 95%   Physical Exam  Constitutional: She is oriented to person, place, and time. She appears well-developed and well-nourished.  Cardiovascular: Normal rate.   Pulmonary/Chest: Effort normal.  Neurological: She is alert and oriented to person, place, and time.  Psychiatric:  Patient is very upset that she has STIs. She was tearful throughout office visit.   Assessment and Plan :   Chlamydia infection  Gonorrhea  Trichomonal infection  Unprotected sex   I am treating patient with azithromycin, Rocefin, and she just started Flagyl yesterday so I recommended she finish the course. Labs for HIV, syphilis are pending.  Wallis Bamberg, PA-C [facility]  09/27/2017  6:45 PM    Wallis Bamberg,  PA-C 09/27/17 1901

## 2017-09-28 LAB — HIV ANTIBODY (ROUTINE TESTING W REFLEX): HIV Screen 4th Generation wRfx: NONREACTIVE

## 2017-09-28 LAB — RPR: RPR: NONREACTIVE

## 2017-09-30 LAB — URINE CYTOLOGY ANCILLARY ONLY: Candida vaginitis: NEGATIVE

## 2017-11-22 ENCOUNTER — Other Ambulatory Visit: Payer: Self-pay

## 2017-11-22 ENCOUNTER — Emergency Department (HOSPITAL_COMMUNITY)
Admission: EM | Admit: 2017-11-22 | Discharge: 2017-11-22 | Disposition: A | Discharge status: Home / Self Care | Payer: Self-pay | Attending: Emergency Medicine | Admitting: Emergency Medicine

## 2017-11-22 ENCOUNTER — Encounter (HOSPITAL_COMMUNITY): Payer: Self-pay | Admitting: Nurse Practitioner

## 2017-11-22 DIAGNOSIS — Z87891 Personal history of nicotine dependence: Secondary | ICD-10-CM | POA: Insufficient documentation

## 2017-11-22 DIAGNOSIS — Z79899 Other long term (current) drug therapy: Secondary | ICD-10-CM | POA: Insufficient documentation

## 2017-11-22 DIAGNOSIS — R1033 Periumbilical pain: Secondary | ICD-10-CM | POA: Insufficient documentation

## 2017-11-22 DIAGNOSIS — R112 Nausea with vomiting, unspecified: Secondary | ICD-10-CM | POA: Insufficient documentation

## 2017-11-22 DIAGNOSIS — F1099 Alcohol use, unspecified with unspecified alcohol-induced disorder: Secondary | ICD-10-CM | POA: Insufficient documentation

## 2017-11-22 DIAGNOSIS — J45909 Unspecified asthma, uncomplicated: Secondary | ICD-10-CM | POA: Insufficient documentation

## 2017-11-22 DIAGNOSIS — Z7289 Other problems related to lifestyle: Secondary | ICD-10-CM

## 2017-11-22 DIAGNOSIS — Z789 Other specified health status: Secondary | ICD-10-CM

## 2017-11-22 LAB — CBC
HCT: 40 % (ref 36.0–46.0)
HEMOGLOBIN: 13.9 g/dL (ref 12.0–15.0)
MCH: 30.5 pg (ref 26.0–34.0)
MCHC: 34.8 g/dL (ref 30.0–36.0)
MCV: 87.9 fL (ref 78.0–100.0)
PLATELETS: 305 10*3/uL (ref 150–400)
RBC: 4.55 MIL/uL (ref 3.87–5.11)
RDW: 13.4 % (ref 11.5–15.5)
WBC: 13.3 10*3/uL — ABNORMAL HIGH (ref 4.0–10.5)

## 2017-11-22 LAB — COMPREHENSIVE METABOLIC PANEL
ALBUMIN: 4.5 g/dL (ref 3.5–5.0)
ALK PHOS: 64 U/L (ref 38–126)
ALT: 20 U/L (ref 14–54)
ANION GAP: 10 (ref 5–15)
AST: 17 U/L (ref 15–41)
BILIRUBIN TOTAL: 0.6 mg/dL (ref 0.3–1.2)
BUN: 11 mg/dL (ref 6–20)
CALCIUM: 9.5 mg/dL (ref 8.9–10.3)
CO2: 23 mmol/L (ref 22–32)
CREATININE: 0.51 mg/dL (ref 0.44–1.00)
Chloride: 103 mmol/L (ref 101–111)
GFR calc non Af Amer: >60 mL/min (ref >60)
GLUCOSE: 106 mg/dL — AB (ref 65–99)
Potassium: 3.6 mmol/L (ref 3.5–5.1)
SODIUM: 136 mmol/L (ref 135–145)
TOTAL PROTEIN: 8.5 g/dL — AB (ref 6.5–8.1)

## 2017-11-22 LAB — URINALYSIS, ROUTINE W REFLEX MICROSCOPIC
BILIRUBIN URINE: NEGATIVE
Glucose, UA: NEGATIVE mg/dL
HGB URINE DIPSTICK: NEGATIVE
KETONES UR: 20 mg/dL — AB
Leukocytes, UA: NEGATIVE
NITRITE: NEGATIVE
PROTEIN: NEGATIVE mg/dL
Specific Gravity, Urine: 1.021 (ref 1.005–1.030)
pH: 7 (ref 5.0–8.0)

## 2017-11-22 LAB — LIPASE, BLOOD: Lipase: 21 U/L (ref 11–51)

## 2017-11-22 MED ORDER — ONDANSETRON HCL 4 MG/2ML IJ SOLN
4.0000 mg | Freq: Once | INTRAMUSCULAR | Status: AC
  Administered 2017-11-22: 4 mg via INTRAVENOUS
  Filled 2017-11-22: qty 2

## 2017-11-22 MED ORDER — SODIUM CHLORIDE 0.9 % IV BOLUS (SEPSIS)
1000.0000 mL | Freq: Once | INTRAVENOUS | Status: AC
  Administered 2017-11-22: 1000 mL via INTRAVENOUS

## 2017-11-22 MED ORDER — ONDANSETRON 4 MG PO TBDP: 4.0000 mg | ORAL_TABLET | Freq: Three times a day (TID) | ORAL | 0 refills | Status: DC | PRN

## 2017-11-22 MED ORDER — FAMOTIDINE 20 MG PO TABS: 20.0000 mg | ORAL_TABLET | Freq: Two times a day (BID) | ORAL | 0 refills | Status: DC

## 2017-11-22 MED ORDER — FAMOTIDINE IN NACL 20-0.9 MG/50ML-% IV SOLN
20.0000 mg | Freq: Once | INTRAVENOUS | Status: AC
  Administered 2017-11-22: 20 mg via INTRAVENOUS
  Filled 2017-11-22: qty 50

## 2017-11-22 NOTE — ED Notes (Signed)
Pt ambulated in hallway to the restroom at this time, urine sample obtained.

## 2017-11-22 NOTE — ED Triage Notes (Signed)
Patient states she had been drinking last night (Vodka and [employer]), and started throwing up around 1030 and it has not stopped. States she feels weak and dehydrated. Reports she has asthma and was having some chest tightness but now that has resolved she is concerned more about dehydration and the abdominal cramping and pain. She reports drinking almost a half a bottle of vodka. Patient is ambulatory and can talk in complete sentences. States she has been trying to hold down water, gatorade, and saltines however she throws them right back up.

## 2017-11-22 NOTE — ED Provider Notes (Signed)
[facility]-EMERGENCY DEPT Provider Note   CSN: [id number] Arrival date & time: 11/22/17  [id number]     History   Chief Complaint Chief Complaint  Patient presents with  . Nausea  . Alcohol Intoxication  . Abdominal Pain    HPI Caitlin Sullivan is a 26 y.o. female.  Patient with history of cholecystectomy and tubal ligation -- presents with complaint of persistent vomiting and abdominal cramping starting about 10:30 PM last night after drinking a large amount of alcohol.  Patient states that she drank vodka.  She states that she typically does not drink as much.  She could not keep down liquids at home.  Abdominal cramping is in the mid abdomen.  It does not radiate.  No associated fevers, chest pain, shortness of breath.  No urinary symptoms.  No other treatments prior to arrival.  Onset of symptoms acute.  Course is constant.  Nothing makes symptoms better or worse.      Past Medical History:  Diagnosis Date  . Asthma   . Complication of anesthesia    wakes up combative, fearful of being abducted, needs mother immediately after waking up  . Gall stones   . GERD (gastroesophageal reflux disease)    with pregnancy  . Heart murmur   . Hyperemesis arising during pregnancy   . Iron deficiency anemia   . Umbilical hernia     Patient Active Problem List   Diagnosis Date Noted  . Leakage, amniotic fluid 12/21/2015  . Supervision of normal pregnancy in third trimester 10/04/2015  . Tobacco smoking affecting pregnancy in third trimester, antepartum 10/04/2015  . Marijuana use 10/04/2015  . Sudden onset of severe abdominal pain   . Cholelithiasis complicating pregnancy in third trimester, antepartum 05/15/2013  . Hyperemesis gravidarum 01/07/2013  . Asthma 01/07/2013    Past Surgical History:  Procedure Laterality Date  . CHOLECYSTECTOMY    . CHOLECYSTECTOMY N/A 09/22/2014   Procedure: LAPAROSCOPIC CHOLECYSTECTOMY;  Surgeon: Axel FillerArmando Ramirez, MD;  [facility];  Service: General;  Laterality: N/A;  . LAPAROSCOPIC TUBAL LIGATION Bilateral 07/02/2016   Procedure: LAPAROSCOPIC BILATERAL TUBAL LIGATION;  Surgeon: Adam PhenixJames G Arnold, MD;  Location: [facility] ORS;  Service: Gynecology;  Laterality: Bilateral;  . PILONIDAL CYST / SINUS EXCISION      OB History    Gravida Para Term Preterm AB Living   3 2 2   1 2    SAB TAB Ectopic Multiple Live Births     1   0 2       Home Medications    Prior to Admission medications   Medication Sig Start Date End Date Taking? Authorizing Provider  fluconazole (DIFLUCAN) 150 MG tablet Take 1 tablet (150 mg total) by mouth once a week. 09/27/17   Wallis BambergMani, Mario, PA-C  metroNIDAZOLE (FLAGYL) 500 MG tablet Take 1 tablet (500 mg total) by mouth 2 (two) times daily. 09/25/17   Elvina SidleLauenstein, Kurt, MD  naproxen (NAPROSYN) 500 MG tablet Take 1 tablet (500 mg total) by mouth 2 (two) times daily with a meal. 02/18/17   [place], Anselm PancoastWilliam J, FNP    Family History Family History  Problem Relation Age of Onset  . Cancer Mother   . Hypertension Mother   . Diabetes Father   . Hypertension Father   . Hyperlipidemia Father   . Stroke Father   . Asthma Brother   . Multiple sclerosis Sister     Social History Social History   Tobacco Use  . Smoking status:  Former Smoker    Packs/day: 0.25    Years: 5.00    Pack years: 1.25    Types: Cigarettes  . Smokeless tobacco: Never Used  Substance Use Topics  . Alcohol use: Yes    Comment: Vodka last drink last night   . Drug use: No     Allergies   Patient has no known allergies.   Review of Systems Review of Systems  Constitutional: Negative for fever.  HENT: Negative for rhinorrhea and sore throat.   Eyes: Negative for redness.  Respiratory: Negative for cough.   Cardiovascular: Negative for chest pain.  Gastrointestinal: Positive for abdominal pain, nausea and vomiting. Negative for diarrhea.  Genitourinary: Negative for dysuria.  Musculoskeletal: Negative for myalgias.    Skin: Negative for rash.  Neurological: Negative for headaches.     Physical Exam Updated Vital Signs BP 126/83 (BP Location: Right Arm)   Pulse 79   Temp 97.6 F (36.4 C) (Oral)   Resp 17   Ht 5\' 5"  (1.651 m)   Wt 95.3 kg (210 lb)   LMP 10/29/2017   SpO2 100%   BMI 34.95 kg/m   Physical Exam  Constitutional: She appears well-developed and well-nourished. She does not appear ill.  HENT:  Head: Normocephalic and atraumatic.  Eyes: Conjunctivae are normal. Right eye exhibits no discharge. Left eye exhibits no discharge.  Neck: Normal range of motion. Neck supple.  Cardiovascular: Normal rate, regular rhythm and normal heart sounds.  Pulmonary/Chest: Effort normal and breath sounds normal.  Abdominal: Soft. Bowel sounds are normal. There is tenderness (Mild) in the periumbilical area. There is no rebound, no guarding, no tenderness at McBurney's point and negative Murphy's sign.  Neurological: She is alert.  Skin: Skin is warm and dry.  Psychiatric: She has a normal mood and affect.  Nursing note and vitals reviewed.    ED Treatments / Results  Labs (all labs ordered are listed, but only abnormal results are displayed) Labs Reviewed  COMPREHENSIVE METABOLIC PANEL - Abnormal; Notable for the following components:      Result Value   Glucose, Bld 106 (*)    Total Protein 8.5 (*)    All other components within normal limits  CBC - Abnormal; Notable for the following components:   WBC 13.3 (*)    All other components within normal limits  URINALYSIS, ROUTINE W REFLEX MICROSCOPIC - Abnormal; Notable for the following components:   Ketones, ur 20 (*)    All other components within normal limits  LIPASE, BLOOD  I-STAT BETA HCG BLOOD, ED ([facility], [facility], [facility] ONLY)    Procedures Procedures (including critical care time)  Medications Ordered in ED Medications  sodium chloride 0.9 % bolus 1,000 mL (0 mLs Intravenous Stopped 11/22/17 1342)  ondansetron (ZOFRAN) injection 4 mg (4  mg Intravenous Given 11/22/17 1045)  famotidine (PEPCID) IVPB 20 mg premix (0 mg Intravenous Stopped 11/22/17 1216)     Initial Impression / Assessment and Plan / ED Course  I have reviewed the triage vital signs and the nursing notes.  Pertinent labs & imaging results that were available during my care of the patient were reviewed by me and considered in my medical decision making (see chart for details).     Patient seen and examined. Work-up reviewed with patient. Medications ordered.  Patient has a reassuring abdominal exam.  Will treat with IV fluids, Zofran, Pepcid.  Will give oral fluid challenge.  Vital signs reviewed and are as follows: BP 126/83 (BP Location:  Right Arm)   Pulse 79   Temp 97.6 F (36.4 C) (Oral)   Resp 17   Ht 5\' 5"  (1.651 m)   Wt 95.3 kg (210 lb)   LMP 10/29/2017   SpO2 100%   BMI 34.95 kg/m   Patient feeling much better after treatment with antiemetics and IV fluids.  She is ready for discharge to home.  Encourage patient to drink clear liquids and eat a bland diet for the next 24 hours.  Home with Pepcid and Zofran.  The patient was urged to return to the Emergency Department immediately with worsening of current symptoms, worsening abdominal pain, persistent vomiting, blood noted in stools, fever, or any other concerns. The patient verbalized understanding.    Final Clinical Impressions(s) / ED Diagnoses   Final diagnoses:  Non-intractable vomiting with nausea, unspecified vomiting type  Alcohol use   Patient with nausea and vomiting and abdominal cramping, non-focal, in setting of recent heavy alcohol use.  Suspect gastritis.  Patient treated in the emergency department with good improvement in symptoms.  No peritoneal signs on exam or during ED stay.  Home with symptomatic control.  ED Discharge Orders    None       Renne Crigler, Cordelia Poche 11/22/17 1401    Gerhard Munch, MD 11/22/17 [phone]

## 2017-11-22 NOTE — Discharge Instructions (Signed)
Please read and follow all provided instructions.  Your diagnoses today include:  1. Non-intractable vomiting with nausea, unspecified vomiting type   2. Alcohol use     Tests performed today include:  Blood counts and electrolytes  Blood tests to check liver and kidney function  Blood tests to check pancreas function  Urine test to look for infection and pregnancy (in women)  Vital signs. See below for your results today.   Medications prescribed:   Take any prescribed medications only as directed.  Home care instructions:   Follow any educational materials contained in this packet.   Your abdominal pain, nausea, vomiting, and diarrhea may be caused by a viral gastroenteritis also called 'stomach flu'. You should rest for the next several days. Keep drinking plenty of fluids and use the medicine for nausea as directed.    Drink clear liquids for the next 24 hours and introduce solid foods slowly after 24 hours using the b.r.a.t. diet (Bananas, Rice, Applesauce, Toast, Yogurt).    Follow-up instructions: Please follow-up with your primary care provider in the next 2 days for further evaluation of your symptoms. If you are not feeling better in 48 hours you may have a condition that is more serious and you need re-evaluation.   Return instructions:  SEEK IMMEDIATE MEDICAL ATTENTION IF:  If you have pain that does not go away or becomes severe   A temperature above 101F develops   Repeated vomiting occurs (multiple episodes)   If you have pain that becomes localized to portions of the abdomen. The right side could possibly be appendicitis. In an adult, the left lower portion of the abdomen could be colitis or diverticulitis.   Blood is being passed in stools or vomit (bright red or black tarry stools)   You develop chest pain, difficulty breathing, dizziness or fainting, or become confused, poorly responsive, or inconsolable (young children)  If you have any other  emergent concerns regarding your health  Additional Information: Abdominal (belly) pain can be caused by many things. Your caregiver performed an examination and possibly ordered blood/urine tests and imaging (CT scan, x-rays, ultrasound). Many cases can be observed and treated at home after initial evaluation in the emergency department. Even though you are being discharged home, abdominal pain can be unpredictable. Therefore, you need a repeated exam if your pain does not resolve, returns, or worsens. Most patients with abdominal pain don't have to be admitted to the hospital or have surgery, but serious problems like appendicitis and gallbladder attacks can start out as nonspecific pain. Many abdominal conditions cannot be diagnosed in one visit, so follow-up evaluations are very important.  Your vital signs today were: BP 116/73 (BP Location: Left Arm)    Pulse 94    Temp 98.2 F (36.8 C) (Oral)    Resp 12    Ht 5\' 5"  (1.651 m)    Wt 95.3 kg (210 lb)    LMP 10/29/2017    SpO2 98%    BMI 34.95 kg/m  If your blood pressure (bp) was elevated above 135/85 this visit, please have this repeated by your doctor within one month. --------------

## 2017-11-24 LAB — I-STAT BETA HCG BLOOD, ED (MC, WL, AP ONLY): I-stat hCG, quantitative: <5 m[IU]/mL (ref <5)

## 2018-02-25 ENCOUNTER — Encounter (HOSPITAL_COMMUNITY): Payer: Self-pay

## 2018-02-25 ENCOUNTER — Emergency Department (HOSPITAL_COMMUNITY): Payer: Self-pay

## 2018-02-25 ENCOUNTER — Other Ambulatory Visit: Payer: Self-pay

## 2018-02-25 ENCOUNTER — Emergency Department (HOSPITAL_COMMUNITY)
Admission: EM | Admit: 2018-02-25 | Discharge: 2018-02-25 | Disposition: A | Discharge status: Home / Self Care | Payer: Self-pay | Attending: Emergency Medicine | Admitting: Emergency Medicine

## 2018-02-25 DIAGNOSIS — R0789 Other chest pain: Secondary | ICD-10-CM | POA: Insufficient documentation

## 2018-02-25 DIAGNOSIS — Z87891 Personal history of nicotine dependence: Secondary | ICD-10-CM | POA: Insufficient documentation

## 2018-02-25 DIAGNOSIS — M549 Dorsalgia, unspecified: Secondary | ICD-10-CM | POA: Insufficient documentation

## 2018-02-25 DIAGNOSIS — Z79899 Other long term (current) drug therapy: Secondary | ICD-10-CM | POA: Insufficient documentation

## 2018-02-25 DIAGNOSIS — J45909 Unspecified asthma, uncomplicated: Secondary | ICD-10-CM | POA: Insufficient documentation

## 2018-02-25 DIAGNOSIS — R0781 Pleurodynia: Secondary | ICD-10-CM

## 2018-02-25 NOTE — ED Triage Notes (Signed)
Patient complains of right chest pain at rib area x 2 days, getting over a cold. Pain with any movement and inspiration, NAD

## 2018-02-25 NOTE — ED Notes (Signed)
Pt called from waiting room x2. Also called over to Radiology. Will check the lobby again.

## 2018-02-25 NOTE — ED Provider Notes (Signed)
[facility] Provider Note   CSN: [id number] Arrival date & time: 02/25/18  1027     History   Chief Complaint No chief complaint on file.   HPI Caitlin Sullivan is a 27 y.o. female.  HPI   27 year old female presents today with complaints of right sided rib and back pain.  Patient notes 1 week ago she had a cough, denies any fever.  She reports that over the last 2 days she has had right-sided rib and chest pain worse with movement, worse with breathing.  She notes symptoms are made worse with extension of the back.  Patient reports that the pain makes it difficult for her to breathe but she has no shortness of breath.  She denies any abdominal pain diaphoresis, left-sided chest pain.  No history DVT or PE, no lower extremity swelling or edema, no significant risk factors for PE other than smoking       Past Medical History:  Diagnosis Date  . Asthma   . Complication of anesthesia    wakes up combative, fearful of being abducted, needs mother immediately after waking up  . Gall stones   . GERD (gastroesophageal reflux disease)    with pregnancy  . Heart murmur   . Hyperemesis arising during pregnancy   . Iron deficiency anemia   . Umbilical hernia     Patient Active Problem List   Diagnosis Date Noted  . Leakage, amniotic fluid 12/21/2015  . Supervision of normal pregnancy in third trimester 10/04/2015  . Tobacco smoking affecting pregnancy in third trimester, antepartum 10/04/2015  . Marijuana use 10/04/2015  . Sudden onset of severe abdominal pain   . Cholelithiasis complicating pregnancy in third trimester, antepartum 05/15/2013  . Hyperemesis gravidarum 01/07/2013  . Asthma 01/07/2013    Past Surgical History:  Procedure Laterality Date  . CHOLECYSTECTOMY    . CHOLECYSTECTOMY N/A 09/22/2014   Procedure: LAPAROSCOPIC CHOLECYSTECTOMY;  Surgeon: Axel FillerArmando Ramirez, MD;  [facility];  Service: General;  Laterality: N/A;  .  LAPAROSCOPIC TUBAL LIGATION Bilateral 07/02/2016   Procedure: LAPAROSCOPIC BILATERAL TUBAL LIGATION;  Surgeon: Adam PhenixJames G Arnold, MD;  Location: [facility] ORS;  Service: Gynecology;  Laterality: Bilateral;  . PILONIDAL CYST / SINUS EXCISION      OB History    Gravida Para Term Preterm AB Living   3 2 2   1 2    SAB TAB Ectopic Multiple Live Births     1   0 2       Home Medications    Prior to Admission medications   Medication Sig Start Date End Date Taking? Authorizing Provider  famotidine (PEPCID) 20 MG tablet Take 1 tablet (20 mg total) by mouth 2 (two) times daily. 11/22/17   Renne CriglerGeiple, Joshua, PA-C  ondansetron (ZOFRAN ODT) 4 MG disintegrating tablet Take 1 tablet (4 mg total) by mouth every 8 (eight) hours as needed for nausea or vomiting. 11/22/17   Renne CriglerGeiple, Joshua, PA-C    Family History Family History  Problem Relation Age of Onset  . Cancer Mother   . Hypertension Mother   . Diabetes Father   . Hypertension Father   . Hyperlipidemia Father   . Stroke Father   . Asthma Brother   . Multiple sclerosis Sister     Social History Social History   Tobacco Use  . Smoking status: Former Smoker    Packs/day: 0.25    Years: 5.00    Pack years: 1.25    Types:  Cigarettes  . Smokeless tobacco: Never Used  Substance Use Topics  . Alcohol use: Yes    Comment: Vodka last drink last night   . Drug use: No     Allergies   Patient has no known allergies.   Review of Systems Review of Systems  All other systems reviewed and are negative.    Physical Exam Updated Vital Signs BP 123/80 (BP Location: Left Arm)   Pulse 85   Temp 98.1 F (36.7 C) (Oral)   Resp 18   LMP 02/08/2018   SpO2 100%   Physical Exam  Constitutional: She is oriented to person, place, and time. She appears well-developed and well-nourished.  HENT:  Head: Normocephalic and atraumatic.  Eyes: Conjunctivae are normal. Pupils are equal, round, and reactive to light. Right eye exhibits no discharge. Left  eye exhibits no discharge. No scleral icterus.  Neck: Normal range of motion. No JVD present. No tracheal deviation present.  Pulmonary/Chest: Effort normal and breath sounds normal. No stridor. No respiratory distress. She has no wheezes. She has no rales. She exhibits no tenderness.  Exquisite tenderness palpation right lateral ribs, no bruising, no redness or rash no midline back tenderness  Abdominal: Soft. She exhibits no distension. There is no tenderness.  Neurological: She is alert and oriented to person, place, and time. Coordination normal.  Psychiatric: She has a normal mood and affect. Her behavior is normal. Judgment and thought content normal.  Nursing note and vitals reviewed.    ED Treatments / Results  Labs (all labs ordered are listed, but only abnormal results are displayed) Labs Reviewed - No data to display  EKG  EKG Interpretation None       Radiology Dg Chest 2 View  Result Date: 02/25/2018 CLINICAL DATA:  Sharp chest pain on the right side. EXAM: CHEST - 2 VIEW COMPARISON:  None. FINDINGS: Trachea is midline. Heart size normal. Lungs are clear. No pleural fluid. IMPRESSION: Negative. Electronically Signed   By: Leanna Battles M.D.   On: 02/25/2018 11:23    Procedures Procedures (including critical care time)  Medications Ordered in ED Medications - No data to display   Initial Impression / Assessment and Plan / ED Course  I have reviewed the triage vital signs and the nursing notes.  Pertinent labs & imaging results that were available during my care of the patient were reviewed by me and considered in my medical decision making (see chart for details).      Final Clinical Impressions(s) / ED Diagnoses   Final diagnoses:  Rib pain    Labs:   Imaging: Chest 2 view  Consults:  Therapeutics:  Discharge Meds:   Assessment/Plan: 27 year old female presents today with likely rib pain.  She has tenderness on exam reproducible with movement.   She has no significant signs or symptoms or risk factors for PE, she is PERC negative with a low Wells score.  Her chest x-ray is reassuring with no fractures or signs of infection.  Patient encouraged to use ibuprofen as needed for discomfort follow-up with primary care if symptoms persist return if they worsen.  She verbalized understanding and agreement to today's plan.      ED Discharge Orders    None       Rosalio Loud 02/25/18 1409    Linwood Dibbles, MD 02/26/18 [phone]

## 2018-02-25 NOTE — Discharge Instructions (Signed)
Please read attached information. If you experience any new or worsening signs or symptoms please return to the emergency room for evaluation. Please follow-up with your primary care provider or specialist as discussed.  °

## 2018-02-25 NOTE — ED Notes (Signed)
Pt ambulated up to NF, reports she left ED lobby to go check on her child who is admitted in the NICU.  Pt placed back in ED census.

## 2018-03-19 ENCOUNTER — Encounter (HOSPITAL_COMMUNITY): Payer: Self-pay | Admitting: Emergency Medicine

## 2018-03-19 ENCOUNTER — Other Ambulatory Visit: Payer: Self-pay

## 2018-03-19 ENCOUNTER — Emergency Department (HOSPITAL_COMMUNITY)
Admission: EM | Admit: 2018-03-19 | Discharge: 2018-03-19 | Disposition: A | Discharge status: Home / Self Care | Payer: Self-pay | Attending: Physician Assistant | Admitting: Physician Assistant

## 2018-03-19 DIAGNOSIS — R059 Cough, unspecified: Secondary | ICD-10-CM

## 2018-03-19 DIAGNOSIS — Z720 Tobacco use: Secondary | ICD-10-CM

## 2018-03-19 DIAGNOSIS — Z79899 Other long term (current) drug therapy: Secondary | ICD-10-CM | POA: Insufficient documentation

## 2018-03-19 DIAGNOSIS — J069 Acute upper respiratory infection, unspecified: Secondary | ICD-10-CM

## 2018-03-19 DIAGNOSIS — R69 Illness, unspecified: Secondary | ICD-10-CM

## 2018-03-19 DIAGNOSIS — J111 Influenza due to unidentified influenza virus with other respiratory manifestations: Secondary | ICD-10-CM | POA: Insufficient documentation

## 2018-03-19 DIAGNOSIS — R112 Nausea with vomiting, unspecified: Secondary | ICD-10-CM | POA: Insufficient documentation

## 2018-03-19 DIAGNOSIS — R197 Diarrhea, unspecified: Secondary | ICD-10-CM

## 2018-03-19 DIAGNOSIS — R05 Cough: Secondary | ICD-10-CM

## 2018-03-19 DIAGNOSIS — J45909 Unspecified asthma, uncomplicated: Secondary | ICD-10-CM | POA: Insufficient documentation

## 2018-03-19 MED ORDER — OSELTAMIVIR PHOSPHATE 75 MG PO CAPS: 75.0000 mg | ORAL_CAPSULE | Freq: Two times a day (BID) | ORAL | 0 refills | Status: DC

## 2018-03-19 MED ORDER — ONDANSETRON 4 MG PO TBDP: 4.0000 mg | ORAL_TABLET | Freq: Three times a day (TID) | ORAL | 0 refills | Status: DC | PRN

## 2018-03-19 MED ORDER — ONDANSETRON 4 MG PO TBDP
8.0000 mg | ORAL_TABLET | Freq: Once | ORAL | Status: AC
  Administered 2018-03-19: 8 mg via ORAL
  Filled 2018-03-19: qty 2

## 2018-03-19 NOTE — ED Provider Notes (Signed)
[facility] EMERGENCY DEPARTMENT Provider Note   CSN: [id number] Arrival date & time: 03/19/18  1345     History   Chief Complaint Chief Complaint  Patient presents with  . Influenza    HPI Annamary Buschman is a 27 y.o. female with a PMHx of anemia, asthma, GERD, and other conditions listed below, who presents to the ED with complaints of flulike symptoms x 2 days.  Patient states that she has several children at home sick, including one who is being seen in the pediatric ER currently for the same symptoms.  Her symptoms include dry cough, fever with Tmax 102.0 last night, chills, body aches, rhinorrhea, sore throat, nausea, 4 episodes of nonbloody nonbilious emesis since yesterday, and 3 episodes of nonbloody watery diarrhea since yesterday.  She has tried Robitussin, Advil, Tylenol, and TheraFlu with no relief of her symptoms, no known aggravating factors.  She did not receive the flu shot this past season.  She admits to being a cigarette smoker.  She denies any ear pain or drainage, chest pain, shortness of breath, abdominal pain, hematemesis, melena, hematochezia, constipation, dysuria, hematuria, vaginal bleeding or discharge, focal arthralgias, numbness, tingling, focal weakness, or any other complaints at this time.  The history is provided by the patient and medical records. No language interpreter was used.  Influenza  Presenting symptoms: cough, diarrhea, fever, myalgias (body aches), nausea, rhinorrhea, sore throat and vomiting   Presenting symptoms: no shortness of breath   Severity:  Moderate Onset quality:  Sudden Duration:  2 days Progression:  Unchanged Chronicity:  New Relieved by:  Nothing Worsened by:  Nothing Ineffective treatments:  OTC medications Associated symptoms: chills   Associated symptoms: no ear pain   Risk factors: sick contacts     Past Medical History:  Diagnosis Date  . Asthma   . Complication of anesthesia    wakes up  combative, fearful of being abducted, needs mother immediately after waking up  . Gall stones   . GERD (gastroesophageal reflux disease)    with pregnancy  . Heart murmur   . Hyperemesis arising during pregnancy   . Iron deficiency anemia   . Umbilical hernia     Patient Active Problem List   Diagnosis Date Noted  . Leakage, amniotic fluid 12/21/2015  . Supervision of normal pregnancy in third trimester 10/04/2015  . Tobacco smoking affecting pregnancy in third trimester, antepartum 10/04/2015  . Marijuana use 10/04/2015  . Sudden onset of severe abdominal pain   . Cholelithiasis complicating pregnancy in third trimester, antepartum 05/15/2013  . Hyperemesis gravidarum 01/07/2013  . Asthma 01/07/2013    Past Surgical History:  Procedure Laterality Date  . CHOLECYSTECTOMY    . CHOLECYSTECTOMY N/A 09/22/2014   Procedure: LAPAROSCOPIC CHOLECYSTECTOMY;  Surgeon: Axel Filler, MD;  Location: [facility] OR;  Service: General;  Laterality: N/A;  . LAPAROSCOPIC TUBAL LIGATION Bilateral 07/02/2016   Procedure: LAPAROSCOPIC BILATERAL TUBAL LIGATION;  Surgeon: Adam Phenix, MD;  Location: [facility] ORS;  Service: Gynecology;  Laterality: Bilateral;  . PILONIDAL CYST / SINUS EXCISION       OB History    Gravida  3   Para  2   Term  2   Preterm      AB  1   Living  2     SAB      TAB  1   Ectopic      Multiple  0   Live Births  2  Home Medications    Prior to Admission medications   Medication Sig Start Date End Date Taking? Authorizing Provider  famotidine (PEPCID) 20 MG tablet Take 1 tablet (20 mg total) by mouth 2 (two) times daily. 11/22/17   Renne Crigler, PA-C  ondansetron (ZOFRAN ODT) 4 MG disintegrating tablet Take 1 tablet (4 mg total) by mouth every 8 (eight) hours as needed for nausea or vomiting. 11/22/17   Renne Crigler, PA-C  ondansetron (ZOFRAN ODT) 4 MG disintegrating tablet Take 1 tablet (4 mg total) by mouth every 8 (eight) hours as needed for  nausea or vomiting. 03/19/18   Zenora Karpel, [place], PA-C  oseltamivir (TAMIFLU) 75 MG capsule Take 1 capsule (75 mg total) by mouth every 12 (twelve) hours. 03/19/18   Zilphia Kozinski, [place], PA-C    Family History Family History  Problem Relation Age of Onset  . Cancer Mother   . Hypertension Mother   . Diabetes Father   . Hypertension Father   . Hyperlipidemia Father   . Stroke Father   . Asthma Brother   . Multiple sclerosis Sister     Social History Social History   Tobacco Use  . Smoking status: Former Smoker    Packs/day: 0.25    Years: 5.00    Pack years: 1.25    Types: Cigarettes  . Smokeless tobacco: Never Used  Substance Use Topics  . Alcohol use: Yes    Comment: Vodka last drink last night   . Drug use: No     Allergies   Patient has no known allergies.   Review of Systems Review of Systems  Constitutional: Positive for chills and fever.  HENT: Positive for rhinorrhea and sore throat. Negative for drooling, ear discharge, ear pain and trouble swallowing.   Respiratory: Positive for cough. Negative for shortness of breath.   Cardiovascular: Negative for chest pain.  Gastrointestinal: Positive for diarrhea, nausea and vomiting. Negative for abdominal pain, blood in stool and constipation.  Genitourinary: Negative for dysuria, hematuria, vaginal bleeding and vaginal discharge.  Musculoskeletal: Positive for myalgias (body aches). Negative for arthralgias.  Skin: Negative for color change.  Allergic/Immunologic: Negative for immunocompromised state.  Neurological: Negative for weakness and numbness.  Psychiatric/Behavioral: Negative for confusion.   All other systems reviewed and are negative for acute change except as noted in the HPI.    Physical Exam Updated Vital Signs BP (!) 145/71 (BP Location: Right Arm)   Pulse 73   Temp 100 F (37.8 C) (Oral)   Resp 18   Ht 5\' 6"  (1.676 m)   Wt 97.5 kg (215 lb)   SpO2 97%   BMI 34.70 kg/m   Physical Exam    Constitutional: She is oriented to person, place, and time. She appears well-developed and well-nourished.  Non-toxic appearance. No distress.  Low-grade temp 100, nontoxic, NAD  HENT:  Head: Normocephalic and atraumatic.  Nose: Mucosal edema and rhinorrhea present.  Mouth/Throat: Uvula is midline, oropharynx is clear and moist and mucous membranes are normal. No trismus in the jaw. No uvula swelling. Tonsils are 0 on the right. Tonsils are 0 on the left. No tonsillar exudate.  Nose congested and with mild rhinorrhea. Oropharynx clear and moist, without uvular swelling or deviation, no trismus or drooling, no tonsillar swelling or erythema, no exudates.    Eyes: Conjunctivae and EOM are normal. Right eye exhibits no discharge. Left eye exhibits no discharge.  Neck: Normal range of motion. Neck supple.  Cardiovascular: Normal rate, regular rhythm, normal heart sounds and  intact distal pulses. Exam reveals no gallop and no friction rub.  No murmur heard. Pulmonary/Chest: Effort normal and breath sounds normal. No respiratory distress. She has no decreased breath sounds. She has no wheezes. She has no rhonchi. She has no rales.  CTAB in all lung fields, no w/r/r, no hypoxia or increased WOB, speaking in full sentences, SpO2 97% on RA   Abdominal: Soft. Normal appearance and bowel sounds are normal. She exhibits no distension. There is no tenderness. There is no rigidity, no rebound, no guarding, no CVA tenderness, no tenderness at McBurney's point and negative Murphy's sign.  Soft, NTND, +BS throughout, no r/g/r, neg murphy's, neg mcburney's, no CVA TTP   Musculoskeletal: Normal range of motion.  Neurological: She is alert and oriented to person, place, and time. She has normal strength. No sensory deficit.  Skin: Skin is warm, dry and intact. No rash noted.  Psychiatric: She has a normal mood and affect.  Nursing note and vitals reviewed.    ED Treatments / Results  Labs (all labs ordered  are listed, but only abnormal results are displayed) Labs Reviewed - No data to display  EKG None  Radiology No results found.  Procedures Procedures (including critical care time)  Medications Ordered in ED Medications  ondansetron (ZOFRAN-ODT) disintegrating tablet 8 mg (8 mg Oral Given 03/19/18 1423)     Initial Impression / Assessment and Plan / ED Course  I have reviewed the triage vital signs and the nursing notes.  Pertinent labs & imaging results that were available during my care of the patient were reviewed by me and considered in my medical decision making (see chart for details).     27 y.o. female here with flu like symptoms x2 days. +Sick contacts at home, children being seen in ED currently as well. On exam, clear lungs, mild rhinorrhea, clear throat, no abdominal tenderness, low-grade temp 100. Symptoms match the flu, r/b/a discussed of tamiflu and pt agrees with this plan, since she's within the time frame of benefit I think this is reasonable. Pt is tolerating PO well here, will give zofran prior to discharge and rx for that going home. Doubt need for labs/imaging. Advised OTC remedies for symptom relief. BRAT diet discussed. Smoking cessation strongly encouraged. F/up with PCP in 1wk for recheck. I explained the diagnosis and have given explicit precautions to return to the ER including for any other new or worsening symptoms. The patient understands and accepts the medical plan as it's been dictated and I have answered their questions. Discharge instructions concerning home care and prescriptions have been given. The patient is STABLE and is discharged to home in good condition.    Final Clinical Impressions(s) / ED Diagnoses   Final diagnoses:  Influenza-like illness  Cough  Nausea vomiting and diarrhea  Upper respiratory tract infection, unspecified type  Tobacco user    ED Discharge Orders        Ordered    oseltamivir (TAMIFLU) 75 MG capsule  Every 12  hours     03/19/18 1409    ondansetron (ZOFRAN ODT) 4 MG disintegrating tablet  Every 8 hours PRN     03/19/18 [address]1409       Yutaka Holberg, [place], [place]PA-C 03/19/18 1424    [place], Cindee Saltourteney Lyn, MD 03/20/18 [phone]

## 2018-03-19 NOTE — ED Triage Notes (Signed)
Pt has flu like symptoms, also has small children with cough, fever, body chills.

## 2018-03-19 NOTE — Discharge Instructions (Addendum)
Your symptoms are consistent with the flu, however it's still possible that it's any other viral illness. Take tamiflu as directed which may help decrease the severity and duration of symptoms if your illness is due to the flu; however, if you don't have the flu and it's just another viral illness, it will get better with time but tamiflu will not have much effect on it. TAMIFLU DOES NOT CURE THE FLU! Take on a full stomach as it can cause some nausea. Use zofran as directed as needed for nausea, take 30 minutes before taking tamiflu in order to lessen the nausea from tamiflu.  Continue to stay well-hydrated. Gargle warm salt water and spit it out and use chloraseptic spray as needed for sore throat. Continue to alternate between Tylenol and Ibuprofen for pain or fever. Use Mucinex for cough suppression/expectoration of mucus. Use netipot and flonase to help with nasal congestion. May consider over-the-counter Benadryl or other antihistamine to decrease secretions and for help with your symptoms. Follow the BRAT diet (bananas-rice-applesauce-toast) as outlined below to help with diarrhea. STOP SMOKING! Follow up with your primary care doctor in 5-7 days for recheck of ongoing symptoms. Return to emergency department for emergent changing or worsening of symptoms.

## 2018-08-14 ENCOUNTER — Other Ambulatory Visit: Payer: Self-pay

## 2018-08-14 ENCOUNTER — Emergency Department (HOSPITAL_COMMUNITY)
Admission: EM | Admit: 2018-08-14 | Discharge: 2018-08-14 | Disposition: A | Discharge status: Home / Self Care | Payer: Self-pay | Attending: Emergency Medicine | Admitting: Emergency Medicine

## 2018-08-14 ENCOUNTER — Encounter (HOSPITAL_COMMUNITY): Payer: Self-pay

## 2018-08-14 DIAGNOSIS — J45909 Unspecified asthma, uncomplicated: Secondary | ICD-10-CM | POA: Insufficient documentation

## 2018-08-14 DIAGNOSIS — H9203 Otalgia, bilateral: Secondary | ICD-10-CM | POA: Insufficient documentation

## 2018-08-14 DIAGNOSIS — Z79899 Other long term (current) drug therapy: Secondary | ICD-10-CM | POA: Insufficient documentation

## 2018-08-14 DIAGNOSIS — J3489 Other specified disorders of nose and nasal sinuses: Secondary | ICD-10-CM | POA: Insufficient documentation

## 2018-08-14 DIAGNOSIS — R070 Pain in throat: Secondary | ICD-10-CM | POA: Insufficient documentation

## 2018-08-14 DIAGNOSIS — J029 Acute pharyngitis, unspecified: Secondary | ICD-10-CM

## 2018-08-14 DIAGNOSIS — R0982 Postnasal drip: Secondary | ICD-10-CM | POA: Insufficient documentation

## 2018-08-14 DIAGNOSIS — R0981 Nasal congestion: Secondary | ICD-10-CM | POA: Insufficient documentation

## 2018-08-14 DIAGNOSIS — Z87891 Personal history of nicotine dependence: Secondary | ICD-10-CM | POA: Insufficient documentation

## 2018-08-14 LAB — GROUP A STREP BY PCR: Group A Strep by PCR: NOT DETECTED

## 2018-08-14 MED ORDER — FLUTICASONE PROPIONATE 50 MCG/ACT NA SUSP: 1.0000 | Freq: Every day | NASAL | 2 refills | Status: DC

## 2018-08-14 NOTE — ED Provider Notes (Signed)
[facility] EMERGENCY DEPARTMENT Provider Note   CSN: [id number] Arrival date & time: 08/14/18  1229     History   Chief Complaint Chief Complaint  Patient presents with  . Sore Throat  . Otalgia    HPI Caitlin Sullivan is a 27 y.o. female who presents for evaluation of sore throat and bilateral ear fullness for the last 4 days.  Patient reports that she feels like her ears are clear.  She reports sore throat that is been ongoing for 4 days.  She reports she has been taking Tylenol and ibuprofen with minimal improvement.  She states that she is also had some nasal congestion, rhinorrhea and postnasal drip.  She states she has been able to swallow her saliva but states worsening pain and swelling.  She has been able to tolerate p.o. she states yesterday she had some chills but denies any fever.  Patient denies any difficulty breathing.  She does not have any history of allergies.  The history is provided by the patient.    Past Medical History:  Diagnosis Date  . Asthma   . Complication of anesthesia    wakes up combative, fearful of being abducted, needs mother immediately after waking up  . Gall stones   . GERD (gastroesophageal reflux disease)    with pregnancy  . Heart murmur   . Hyperemesis arising during pregnancy   . Iron deficiency anemia   . Umbilical hernia     Patient Active Problem List   Diagnosis Date Noted  . Leakage, amniotic fluid 12/21/2015  . Supervision of normal pregnancy in third trimester 10/04/2015  . Tobacco smoking affecting pregnancy in third trimester, antepartum 10/04/2015  . Marijuana use 10/04/2015  . Sudden onset of severe abdominal pain   . Cholelithiasis complicating pregnancy in third trimester, antepartum 05/15/2013  . Hyperemesis gravidarum 01/07/2013  . Asthma 01/07/2013    Past Surgical History:  Procedure Laterality Date  . CHOLECYSTECTOMY    . CHOLECYSTECTOMY N/A 09/22/2014   Procedure: LAPAROSCOPIC  CHOLECYSTECTOMY;  Surgeon: Axel Filler, MD;  Location: [facility] OR;  Service: General;  Laterality: N/A;  . LAPAROSCOPIC TUBAL LIGATION Bilateral 07/02/2016   Procedure: LAPAROSCOPIC BILATERAL TUBAL LIGATION;  Surgeon: Adam Phenix, MD;  Location: [facility] ORS;  Service: Gynecology;  Laterality: Bilateral;  . PILONIDAL CYST / SINUS EXCISION       OB History    Gravida  3   Para  2   Term  2   Preterm      AB  1   Living  2     SAB      TAB  1   Ectopic      Multiple  0   Live Births  2            Home Medications    Prior to Admission medications   Medication Sig Start Date End Date Taking? Authorizing Provider  famotidine (PEPCID) 20 MG tablet Take 1 tablet (20 mg total) by mouth 2 (two) times daily. 11/22/17   Renne Crigler, PA-C  fluticasone (FLONASE) 50 MCG/ACT nasal spray Place 1 spray into both nostrils daily. 08/14/18   Maxwell Caul, PA-C  ondansetron (ZOFRAN ODT) 4 MG disintegrating tablet Take 1 tablet (4 mg total) by mouth every 8 (eight) hours as needed for nausea or vomiting. 11/22/17   Renne Crigler, PA-C  ondansetron (ZOFRAN ODT) 4 MG disintegrating tablet Take 1 tablet (4 mg total) by mouth every 8 (eight) hours as  needed for nausea or vomiting. 03/19/18   Street, AlixMercedes, PA-C  oseltamivir (TAMIFLU) 75 MG capsule Take 1 capsule (75 mg total) by mouth every 12 (twelve) hours. 03/19/18   Street, [place] RussellMercedes, PA-C    Family History Family History  Problem Relation Age of Onset  . Cancer Mother   . Hypertension Mother   . Diabetes Father   . Hypertension Father   . Hyperlipidemia Father   . Stroke Father   . Asthma Brother   . Multiple sclerosis Sister     Social History Social History   Tobacco Use  . Smoking status: Former Smoker    Packs/day: 0.25    Years: 5.00    Pack years: 1.25    Types: Cigarettes  . Smokeless tobacco: Never Used  Substance Use Topics  . Alcohol use: Yes    Comment: Vodka last drink last night   . Drug use: No      Allergies   Patient has no known allergies.   Review of Systems Review of Systems  Constitutional: Positive for chills. Negative for fever.  HENT: Positive for congestion, ear pain, postnasal drip, rhinorrhea and sore throat. Negative for trouble swallowing.   Respiratory: Negative for shortness of breath.   All other systems reviewed and are negative.    Physical Exam Updated Vital Signs BP (!) 148/89 (BP Location: Right Arm)   Pulse 91   Temp 98.5 F (36.9 C) (Oral)   Resp 16   Ht 5\' 6"  (1.676 m)   Wt 104.3 kg   SpO2 95%   BMI 37.12 kg/m   Physical Exam  Constitutional: She appears well-developed and well-nourished.  HENT:  Head: Normocephalic and atraumatic.  Right Ear: Tympanic membrane normal. Tympanic membrane is not erythematous and not bulging. No middle ear effusion.  Left Ear: Tympanic membrane normal. Tympanic membrane is not erythematous and not bulging.  No middle ear effusion.  Nose: Mucosal edema present.  Mouth/Throat: Uvula is midline and mucous membranes are normal. No trismus in the jaw. Posterior oropharyngeal erythema present.  Bilateral nasal turbinates are edematous and erythematous.  Posterior oropharynx is erythematous.  No evidence of exudates, edema.  Uvula is midline.  No trismus.  Airways patent, phonation is intact.  Eyes: Conjunctivae and EOM are normal. Right eye exhibits no discharge. Left eye exhibits no discharge. No scleral icterus.  Pulmonary/Chest: Effort normal.  Neurological: She is alert.  Skin: Skin is warm and dry.  Psychiatric: She has a normal mood and affect. Her speech is normal and behavior is normal.  Nursing note and vitals reviewed.    ED Treatments / Results  Labs (all labs ordered are listed, but only abnormal results are displayed) Labs Reviewed  GROUP A STREP BY PCR    EKG None  Radiology No results found.  Procedures Procedures (including critical care time)  Medications Ordered in  ED Medications - No data to display   Initial Impression / Assessment and Plan / ED Course  I have reviewed the triage vital signs and the nursing notes.  Pertinent labs & imaging results that were available during my care of the patient were reviewed by me and considered in my medical decision making (see chart for details).     27 year old female who presents for evaluation of sore throat bilateral regular fullness x4 days.  Reports chills.  Has been able to tolerate secretions and p.o. Patient is afebrile, non-toxic appearing, sitting comfortably on examination table. Vital signs reviewed and stable.  On exam,  posterior oropharynx is slightly erythematous.  Patient also has some edematous, erythematous nasal turbinates.  Uvula is midline.  Exam is not concerning for Ludwig angina or peritonsillar abscess.  Consider pharyngitis versus postnasal drip versus viral URI.  Initial strep ordered at triage.  Strep reviewed.  Negative.  Discussed with patient.  Encourage at home supportive measures. Patient had ample opportunity for questions and discussion. All patient's questions were answered with full understanding. Strict return precautions discussed. Patient expresses understanding and agreement to plan.    Final Clinical Impressions(s) / ED Diagnoses   Final diagnoses:  Sore throat  Nasal congestion    ED Discharge Orders         Ordered    fluticasone (FLONASE) 50 MCG/ACT nasal spray  Daily     08/14/18 1427           Maxwell Caul, PA-C 08/14/18 1443    Charlynne Pander, MD 08/16/18 2118

## 2018-08-14 NOTE — ED Triage Notes (Signed)
Patient complains of sore throat and bilateral ear fullness x 4 days. Painful to swallow and taking tylenol with no relief

## 2018-08-14 NOTE — Discharge Instructions (Signed)
As we discussed your strep test was negative today. You may have a virus that is causing your pain. Additionally, your nasal congestion may be contributing to your symptoms.   You can take Tylenol or Ibuprofen as directed for pain. You can alternate Tylenol and Ibuprofen every 4 hours. If you take Tylenol at 1pm, then you can take Ibuprofen at 5pm. Then you can take Tylenol again at 9pm.   Use Flonase as directed.   Make sure you are drinking plenty of fluids and staying hydrated.   Return to the Emergency Department for any worsening pain, fever, inability to swallow your saliva, difficulty breathing or any other worsening or concerning symptoms.

## 2018-08-14 NOTE — ED Notes (Signed)
Declined W/C at D/C and was escorted to lobby by RN. 

## 2018-08-26 ENCOUNTER — Emergency Department (HOSPITAL_COMMUNITY)
Admission: EM | Admit: 2018-08-26 | Discharge: 2018-08-26 | Disposition: A | Discharge status: Home / Self Care | Payer: Self-pay | Attending: Emergency Medicine | Admitting: Emergency Medicine

## 2018-08-26 ENCOUNTER — Other Ambulatory Visit: Payer: Self-pay

## 2018-08-26 ENCOUNTER — Encounter (HOSPITAL_COMMUNITY): Payer: Self-pay | Admitting: *Deleted

## 2018-08-26 DIAGNOSIS — Z79899 Other long term (current) drug therapy: Secondary | ICD-10-CM | POA: Insufficient documentation

## 2018-08-26 DIAGNOSIS — J029 Acute pharyngitis, unspecified: Secondary | ICD-10-CM | POA: Insufficient documentation

## 2018-08-26 DIAGNOSIS — J45909 Unspecified asthma, uncomplicated: Secondary | ICD-10-CM | POA: Insufficient documentation

## 2018-08-26 DIAGNOSIS — Z87891 Personal history of nicotine dependence: Secondary | ICD-10-CM | POA: Insufficient documentation

## 2018-08-26 LAB — MONONUCLEOSIS SCREEN: Mono Screen: NEGATIVE

## 2018-08-26 MED ORDER — DEXAMETHASONE SODIUM PHOSPHATE 10 MG/ML IJ SOLN
10.0000 mg | Freq: Once | INTRAMUSCULAR | Status: AC
  Administered 2018-08-26: 10 mg via INTRAMUSCULAR
  Filled 2018-08-26: qty 1

## 2018-08-26 MED ORDER — PHENOL 1.4 % MT LIQD: 1.0000 | OROMUCOSAL | 0 refills | Status: DC | PRN

## 2018-08-26 MED ORDER — AMOXICILLIN 500 MG PO CAPS: 500.0000 mg | ORAL_CAPSULE | Freq: Three times a day (TID) | ORAL | 0 refills | Status: DC

## 2018-08-26 NOTE — ED Provider Notes (Signed)
[facility] EMERGENCY DEPARTMENT Provider Note   CSN: [id number] Arrival date & time: 08/26/18  1736     History   Chief Complaint Chief Complaint  Patient presents with  . Sore Throat    HPI Caitlin Sullivan is a 27 y.o. female presenting for evaluation of sore throat.  Patient states for the past 2 weeks, she has been having sore throat.  It is worse on the left side.  She was seen last week in the ER, had a negative strep test.  She has been using Flonase and ibuprofen without improvement of symptoms.  She reports pain is worse when swallowing, improved when drinking cold liquids.  She denies trismus or muffled voice.  Denies fevers, chills, difficulty breathing, or chest pain.  She has no medical problems, takes no medications daily.  She smokes marijuana daily and tobacco.  She has been doing this since she is 10.  Patient reports is an area under her jaw the left side which is tender, and radiates to her ear.  She denies dental pain.  She denies sick contacts.  HPI  Past Medical History:  Diagnosis Date  . Asthma   . Complication of anesthesia    wakes up combative, fearful of being abducted, needs mother immediately after waking up  . Gall stones   . GERD (gastroesophageal reflux disease)    with pregnancy  . Heart murmur   . Hyperemesis arising during pregnancy   . Iron deficiency anemia   . Umbilical hernia     Patient Active Problem List   Diagnosis Date Noted  . Leakage, amniotic fluid 12/21/2015  . Supervision of normal pregnancy in third trimester 10/04/2015  . Tobacco smoking affecting pregnancy in third trimester, antepartum 10/04/2015  . Marijuana use 10/04/2015  . Sudden onset of severe abdominal pain   . Cholelithiasis complicating pregnancy in third trimester, antepartum 05/15/2013  . Hyperemesis gravidarum 01/07/2013  . Asthma 01/07/2013    Past Surgical History:  Procedure Laterality Date  . CHOLECYSTECTOMY    . CHOLECYSTECTOMY N/A  09/22/2014   Procedure: LAPAROSCOPIC CHOLECYSTECTOMY;  Surgeon: Axel Filler, MD;  Location: [facility] OR;  Service: General;  Laterality: N/A;  . LAPAROSCOPIC TUBAL LIGATION Bilateral 07/02/2016   Procedure: LAPAROSCOPIC BILATERAL TUBAL LIGATION;  Surgeon: Adam Phenix, MD;  Location: [facility] ORS;  Service: Gynecology;  Laterality: Bilateral;  . PILONIDAL CYST / SINUS EXCISION       OB History    Gravida  3   Para  2   Term  2   Preterm      AB  1   Living  2     SAB      TAB  1   Ectopic      Multiple  0   Live Births  2            Home Medications    Prior to Admission medications   Medication Sig Start Date End Date Taking? Authorizing Provider  amoxicillin (AMOXIL) 500 MG capsule Take 1 capsule (500 mg total) by mouth 3 (three) times daily. 08/26/18   Shavaun Osterloh, PA-C  famotidine (PEPCID) 20 MG tablet Take 1 tablet (20 mg total) by mouth 2 (two) times daily. 11/22/17   Renne Crigler, PA-C  fluticasone (FLONASE) 50 MCG/ACT nasal spray Place 1 spray into both nostrils daily. 08/14/18   Maxwell Caul, PA-C  ondansetron (ZOFRAN ODT) 4 MG disintegrating tablet Take 1 tablet (4 mg total) by mouth every 8 (  eight) hours as needed for nausea or vomiting. 11/22/17   Renne Crigler, PA-C  ondansetron (ZOFRAN ODT) 4 MG disintegrating tablet Take 1 tablet (4 mg total) by mouth every 8 (eight) hours as needed for nausea or vomiting. 03/19/18   Street, [place], PA-C  oseltamivir (TAMIFLU) 75 MG capsule Take 1 capsule (75 mg total) by mouth every 12 (twelve) hours. 03/19/18   Street, Mercedes, PA-C  phenol (CHLORASEPTIC) 1.4 % LIQD Use as directed 1 spray in the mouth or throat as needed for throat irritation / pain. 08/26/18   Jahniya Duzan, PA-C    Family History Family History  Problem Relation Age of Onset  . Cancer Mother   . Hypertension Mother   . Diabetes Father   . Hypertension Father   . Hyperlipidemia Father   . Stroke Father   . Asthma Brother   . Multiple  sclerosis Sister     Social History Social History   Tobacco Use  . Smoking status: Former Smoker    Packs/day: 0.25    Years: 5.00    Pack years: 1.25    Types: Cigarettes  . Smokeless tobacco: Never Used  Substance Use Topics  . Alcohol use: Yes    Comment: Vodka last drink last night   . Drug use: No     Allergies   Patient has no known allergies.   Review of Systems Review of Systems  Constitutional: Negative for fever.  HENT: Positive for sore throat. Negative for facial swelling, trouble swallowing and voice change.   Respiratory: Negative for cough and shortness of breath.   Cardiovascular: Negative for chest pain.  Gastrointestinal: Negative for nausea and vomiting.     Physical Exam Updated Vital Signs BP (!) 129/101 (BP Location: Right Arm)   Pulse 69   Temp 98.8 F (37.1 C) (Oral)   Resp 16   Ht 5\' 7"  (1.702 m)   Wt 104.3 kg   SpO2 99%   BMI 36.02 kg/m   Physical Exam  Constitutional: She is oriented to person, place, and time. She appears well-developed and well-nourished. No distress.  Sitting in the chair in no acute distress  HENT:  Head: Normocephalic and atraumatic.  Right Ear: Tympanic membrane, external ear and ear canal normal.  Left Ear: Tympanic membrane, external ear and ear canal normal.  Nose: Mucosal edema present. Right sinus exhibits no maxillary sinus tenderness and no frontal sinus tenderness. Left sinus exhibits no maxillary sinus tenderness and no frontal sinus tenderness.  Mouth/Throat: Uvula is midline, oropharynx is clear and moist and mucous membranes are normal. No tonsillar exudate.  No tonsillar swelling.  Uvula midline with equal palate rise.  No OP erythema.  No obvious tonsillar abscess.  No trismus.  No muffled voice.  No obvious facial swelling.  No dental pain.  Minimal nasal mucosal edema.  TMs nonerythematous and not bulging bilaterally.  Eyes: Pupils are equal, round, and reactive to light. Conjunctivae and EOM  are normal.  Neck: Normal range of motion.  Cardiovascular: Normal rate, regular rhythm and intact distal pulses.  Pulmonary/Chest: Effort normal and breath sounds normal. She has no decreased breath sounds. She has no wheezes. She has no rhonchi. She has no rales.  Pt speaking in full sentences without difficulty.  Clear lung sounds in all fields  Abdominal: Soft. She exhibits no distension. There is no tenderness.  Musculoskeletal: Normal range of motion.  Lymphadenopathy:    She has cervical adenopathy.  Neurological: She is alert and oriented to  person, place, and time.  Skin: Skin is warm. Capillary refill takes less than 2 seconds.  Psychiatric: She has a normal mood and affect.  Nursing note and vitals reviewed.    ED Treatments / Results  Labs (all labs ordered are listed, but only abnormal results are displayed) Labs Reviewed  MONONUCLEOSIS SCREEN    EKG None  Radiology No results found.  Procedures Procedures (including critical care time)  Medications Ordered in ED Medications  dexamethasone (DECADRON) injection 10 mg (10 mg Intramuscular Given 08/26/18 1901)     Initial Impression / Assessment and Plan / ED Course  I have reviewed the triage vital signs and the nursing notes.  Pertinent labs & imaging results that were available during my care of the patient were reviewed by me and considered in my medical decision making (see chart for details).     Senting for evaluation of 2-week history of sore throat.  Physical exam reassuring, she is afebrile not tachycardic.  Appears nontoxic.  History concerning for possible PTA or deep space infection, however exam is very reassuring.  No uvular deviation or tonsillar swelling.  No trismus or muffled voice.  No facial swelling.  Doubt PTA, retropharyngeal abscess, or Ludwig's.  Patient with cervical adenopathy and tenderness overlying the lymph node on the left side, likely source of "sore throat". I do not believe labs  and CT are indicated today.   Mono test ordered.  Decadron given for pain and swelling.  On reassessment, patient reports no significant change to the Decadron.  Mono test is negative.  Discussed findings with patient.  Discussed that since symptoms have been going on for several weeks, will trial antibiotics.  Sore throat spray given.  Patient to continue Tylenol, ibuprofen, and Flonase.  Discussed follow-up with ENT if symptoms are not improving.  At this time, patient appears safe for discharge.  Return precautions given.  Patient states she understands and agrees to plan.   Final Clinical Impressions(s) / ED Diagnoses   Final diagnoses:  Sore throat    ED Discharge Orders         Ordered    amoxicillin (AMOXIL) 500 MG capsule  3 times daily     08/26/18 2004    phenol (CHLORASEPTIC) 1.4 % LIQD  As needed     08/26/18 2004           Olden Klauer, PA-C 08/26/18 2240    Melene Plan, DO 08/26/18 2312

## 2018-08-26 NOTE — Discharge Instructions (Addendum)
Continue taking Flonase. Take antibiotics as prescribed.  Take the entire course, even if your symptoms resolved. Use sore throat spray as needed. Use Tylenol or ibuprofen as needed for pain. Make sure you are drinking lots of water. Follow-up with your nose and throat doctor if your symptoms are not improving after finishing antibiotics. Return to the emergency room if you develop difficulty breathing, high fevers, difficulty opening her mouth, or any new or concerning symptoms.

## 2018-08-26 NOTE — ED Triage Notes (Signed)
Pt in c/o swollen area to the left side of her throat, she had a sore throat last week and tested negative for strep, now has area of swelling in jaw line that is tender to touch

## 2018-09-26 ENCOUNTER — Emergency Department (HOSPITAL_COMMUNITY)
Admission: EM | Admit: 2018-09-26 | Discharge: 2018-09-26 | Disposition: A | Discharge status: Home / Self Care | Payer: Self-pay | Attending: Emergency Medicine | Admitting: Emergency Medicine

## 2018-09-26 DIAGNOSIS — Z79899 Other long term (current) drug therapy: Secondary | ICD-10-CM | POA: Insufficient documentation

## 2018-09-26 DIAGNOSIS — Z87891 Personal history of nicotine dependence: Secondary | ICD-10-CM | POA: Insufficient documentation

## 2018-09-26 DIAGNOSIS — N898 Other specified noninflammatory disorders of vagina: Secondary | ICD-10-CM | POA: Insufficient documentation

## 2018-09-26 DIAGNOSIS — J45909 Unspecified asthma, uncomplicated: Secondary | ICD-10-CM | POA: Insufficient documentation

## 2018-09-26 DIAGNOSIS — Z202 Contact with and (suspected) exposure to infections with a predominantly sexual mode of transmission: Secondary | ICD-10-CM | POA: Insufficient documentation

## 2018-09-26 LAB — URINALYSIS, ROUTINE W REFLEX MICROSCOPIC
Bilirubin Urine: NEGATIVE
Glucose, UA: NEGATIVE mg/dL
HGB URINE DIPSTICK: NEGATIVE
Ketones, ur: NEGATIVE mg/dL
Leukocytes, UA: NEGATIVE
Nitrite: NEGATIVE
PROTEIN: NEGATIVE mg/dL
Specific Gravity, Urine: 1.028 (ref 1.005–1.030)
pH: 6 (ref 5.0–8.0)

## 2018-09-26 LAB — WET PREP, GENITAL
CLUE CELLS WET PREP: NONE SEEN
Sperm: NONE SEEN
Trich, Wet Prep: NONE SEEN
Yeast Wet Prep HPF POC: NONE SEEN

## 2018-09-26 LAB — PREGNANCY, URINE: PREG TEST UR: NEGATIVE

## 2018-09-26 MED ORDER — AZITHROMYCIN 250 MG PO TABS
1000.0000 mg | ORAL_TABLET | Freq: Once | ORAL | Status: AC
  Administered 2018-09-26: 1000 mg via ORAL
  Filled 2018-09-26: qty 4

## 2018-09-26 MED ORDER — CEFTRIAXONE SODIUM 250 MG IJ SOLR
250.0000 mg | Freq: Once | INTRAMUSCULAR | Status: AC
  Administered 2018-09-26: 250 mg via INTRAMUSCULAR
  Filled 2018-09-26: qty 250

## 2018-09-26 NOTE — Discharge Instructions (Signed)
Today you were treated for an STD exposure, please refrain from sexual intercourse for the next 10 days. If you experience any burning with urination, fever, worsening symptoms return to the ED for reevaluation.

## 2018-09-26 NOTE — ED Triage Notes (Signed)
Reports she found out her boyfriend has chlamydia and wants to be checked for STD's.

## 2018-09-26 NOTE — ED Notes (Signed)
Patient verbalizes understanding of discharge instructions. Opportunity for questioning and answers were provided. Armband removed by staff, pt discharged from ED.  

## 2018-09-26 NOTE — ED Provider Notes (Signed)
[facility] EMERGENCY DEPARTMENT Provider Note   CSN: [id number] Arrival date & time: 09/26/18  1913     History   Chief Complaint Chief Complaint  Patient presents with  . STD check    HPI Caitlin Sullivan is a 27 y.o. female.  27 y.o female with a PMH of Asthma, GERD presents to the ED with a chief complaint of STD exposure.  She reported her partner has been diagnosed with chlamydia this past week and she would like treatment for this.  She reports a white discharge that has been going on for the past 4 days.  States the discharge has worsening as and now has a foul odor to it.  Has not tried any medications to relieve her symptoms.  She denies any previous history of STIs.  She denies any fevers, dysuria, vaginal bleeding or other complaints.     Past Medical History:  Diagnosis Date  . Asthma   . Complication of anesthesia    wakes up combative, fearful of being abducted, needs mother immediately after waking up  . Gall stones   . GERD (gastroesophageal reflux disease)    with pregnancy  . Heart murmur   . Hyperemesis arising during pregnancy   . Iron deficiency anemia   . Umbilical hernia     Patient Active Problem List   Diagnosis Date Noted  . Leakage, amniotic fluid 12/21/2015  . Supervision of normal pregnancy in third trimester 10/04/2015  . Tobacco smoking affecting pregnancy in third trimester, antepartum 10/04/2015  . Marijuana use 10/04/2015  . Sudden onset of severe abdominal pain   . Cholelithiasis complicating pregnancy in third trimester, antepartum 05/15/2013  . Hyperemesis gravidarum 01/07/2013  . Asthma 01/07/2013    Past Surgical History:  Procedure Laterality Date  . CHOLECYSTECTOMY    . CHOLECYSTECTOMY N/A 09/22/2014   Procedure: LAPAROSCOPIC CHOLECYSTECTOMY;  Surgeon: Axel Filler, MD;  Location: [facility] OR;  Service: General;  Laterality: N/A;  . LAPAROSCOPIC TUBAL LIGATION Bilateral 07/02/2016   Procedure: LAPAROSCOPIC  BILATERAL TUBAL LIGATION;  Surgeon: Adam Phenix, MD;  Location: [facility] ORS;  Service: Gynecology;  Laterality: Bilateral;  . PILONIDAL CYST / SINUS EXCISION       OB History    Gravida  3   Para  2   Term  2   Preterm      AB  1   Living  2     SAB      TAB  1   Ectopic      Multiple  0   Live Births  2            Home Medications    Prior to Admission medications   Medication Sig Start Date End Date Taking? Authorizing Provider  amoxicillin (AMOXIL) 500 MG capsule Take 1 capsule (500 mg total) by mouth 3 (three) times daily. 08/26/18   Caccavale, Sophia, PA-C  famotidine (PEPCID) 20 MG tablet Take 1 tablet (20 mg total) by mouth 2 (two) times daily. 11/22/17   Renne Crigler, PA-C  fluticasone (FLONASE) 50 MCG/ACT nasal spray Place 1 spray into both nostrils daily. 08/14/18   Maxwell Caul, PA-C  ondansetron (ZOFRAN ODT) 4 MG disintegrating tablet Take 1 tablet (4 mg total) by mouth every 8 (eight) hours as needed for nausea or vomiting. 11/22/17   Renne Crigler, PA-C  ondansetron (ZOFRAN ODT) 4 MG disintegrating tablet Take 1 tablet (4 mg total) by mouth every 8 (eight) hours as needed for nausea  or vomiting. 03/19/18   Street, [place], PA-C  oseltamivir (TAMIFLU) 75 MG capsule Take 1 capsule (75 mg total) by mouth every 12 (twelve) hours. 03/19/18   Street, Mercedes, PA-C  phenol (CHLORASEPTIC) 1.4 % LIQD Use as directed 1 spray in the mouth or throat as needed for throat irritation / pain. 08/26/18   Caccavale, Sophia, PA-C    Family History Family History  Problem Relation Age of Onset  . Cancer Mother   . Hypertension Mother   . Diabetes Father   . Hypertension Father   . Hyperlipidemia Father   . Stroke Father   . Asthma Brother   . Multiple sclerosis Sister     Social History Social History   Tobacco Use  . Smoking status: Former Smoker    Packs/day: 0.25    Years: 5.00    Pack years: 1.25    Types: Cigarettes  . Smokeless tobacco: Never Used    Substance Use Topics  . Alcohol use: Yes    Comment: Vodka last drink last night   . Drug use: No     Allergies   Patient has no known allergies.   Review of Systems Review of Systems  Constitutional: Negative for fever.  Respiratory: Negative for shortness of breath.   Cardiovascular: Negative for chest pain.  Gastrointestinal: Negative for abdominal pain.  Genitourinary: Positive for vaginal discharge. Negative for dysuria, flank pain, vaginal bleeding and vaginal pain.  Musculoskeletal: Negative for back pain.  Neurological: Negative for light-headedness and headaches.  All other systems reviewed and are negative.    Physical Exam Updated Vital Signs BP (!) 149/87 (BP Location: Right Arm)   Pulse 68   Temp 98.6 F (37 C) (Oral)   Resp 18   Ht 5\' 6"  (1.676 m)   Wt 104.3 kg   LMP 08/29/2018   SpO2 100%   BMI 37.12 kg/m   Physical Exam  Constitutional: She is oriented to person, place, and time. She appears well-developed and well-nourished.  HENT:  Head: Normocephalic and atraumatic.  Neck: Normal range of motion. Neck supple.  Cardiovascular: Normal heart sounds.  Pulmonary/Chest: Effort normal.  Abdominal: Soft.  Genitourinary: Pelvic exam was performed with patient supine. There is no rash or tenderness on the right labia. There is no rash or tenderness on the left labia. Cervix exhibits no motion tenderness. Right adnexum displays no tenderness. Left adnexum displays no tenderness. No erythema, tenderness or bleeding in the vagina. No foreign body in the vagina. Vaginal discharge found.  Genitourinary Comments: Small amount of copious white discharge from vaginal vault, no foul odor noted.  No vesicles, rash, ulcerations present. Chaperone by Nehemiah Settle RN  Neurological: She is alert and oriented to person, place, and time.  Skin: Skin is warm and dry.  Nursing note and vitals reviewed.    ED Treatments / Results  Labs (all labs ordered are listed, but only  abnormal results are displayed) Labs Reviewed  WET PREP, GENITAL - Abnormal; Notable for the following components:      Result Value   WBC, Wet Prep HPF POC FEW (*)    All other components within normal limits  URINALYSIS, ROUTINE W REFLEX MICROSCOPIC  PREGNANCY, URINE  GC/CHLAMYDIA PROBE AMP ([place]) NOT AT [facility]    EKG None  Radiology No results found.  Procedures Procedures (including critical care time)  Medications Ordered in ED Medications  cefTRIAXone (ROCEPHIN) injection 250 mg (has no administration in time range)  azithromycin (ZITHROMAX) tablet 1,000 mg (has  no administration in time range)     Initial Impression / Assessment and Plan / ED Course  I have reviewed the triage vital signs and the nursing notes.  Pertinent labs & imaging results that were available during my care of the patient were reviewed by me and considered in my medical decision making (see chart for details).     Presents requesting to be treated for chlamydia, her partner tested positive this past week and she is having some white foul discharge.  Will send in a UA to rule out any urinary tract infection.  Patient denies any abdominal pain at this time but states the discharge has gone foul in odor will perform pelvic exam and collect GC and wet prep to rule out any STI.  UA showed no nitrites, leukocytes, white blood cell count, or blood.  At this time I believe urinary tract infection is less likely or stone disease is less likely as there is no blood or symptoms from patient.  Will treat patient for gonorrhea and chlamydia as she reports her partner tested positive this week.  During pelvic examination there is slight amount of white copious discharge coming from the vaginal vault cervix looks within normal limits no petechiae noted no erythema noted.  Patient's urinalysis showed no nitrites, leukocytes, white blood cell count she denies any urinary symptoms.  Wet prep showed few white  count however no trichomonas, clue cells.  I believe she is less likely to be having trichomonas or bacterial vaginosis at this time.  Her GC has been sent off for culture but patient is requesting to be treated now as patient's boyfriend recently got diagnosed with chlamydia.  I have treated patient with azithromycin and Rocephin.  She is advised to return if any of her symptoms worsen or she develops any urinary symptoms.  Patient understands and agrees with plan.  Return precautions provided. Final Clinical Impressions(s) / ED Diagnoses   Final diagnoses:  STD exposure    ED Discharge Orders    None       Claude Manges, Cordelia Poche 09/26/18 2042    Sabas Sous, MD 09/26/18 [phone]

## 2018-09-28 LAB — GC/CHLAMYDIA PROBE AMP (~~LOC~~) NOT AT ARMC
Chlamydia: NEGATIVE
Neisseria Gonorrhea: NEGATIVE

## 2018-10-05 NOTE — ED Notes (Signed)
10/05/2018, Pt. Called for STD results , Results are negative discussed with pt. All questions answered

## 2020-12-06 ENCOUNTER — Ambulatory Visit (HOSPITAL_COMMUNITY)
Admission: EM | Admit: 2020-12-06 | Discharge: 2020-12-06 | Disposition: A | Discharge status: Home / Self Care | Payer: Medicaid Other | Attending: Family Medicine | Admitting: Family Medicine

## 2020-12-06 ENCOUNTER — Encounter (HOSPITAL_COMMUNITY): Payer: Self-pay

## 2020-12-06 DIAGNOSIS — R109 Unspecified abdominal pain: Secondary | ICD-10-CM

## 2020-12-06 DIAGNOSIS — N898 Other specified noninflammatory disorders of vagina: Secondary | ICD-10-CM | POA: Diagnosis present

## 2020-12-06 LAB — POCT URINALYSIS DIPSTICK, ED / UC
Bilirubin Urine: NEGATIVE
Glucose, UA: NEGATIVE mg/dL
Leukocytes,Ua: NEGATIVE
Nitrite: NEGATIVE
Protein, ur: NEGATIVE mg/dL
Specific Gravity, Urine: >=1.03 (ref 1.005–1.030)
Urobilinogen, UA: 0.2 mg/dL (ref 0.0–1.0)
pH: 6 (ref 5.0–8.0)

## 2020-12-06 LAB — POC URINE PREG, ED: Preg Test, Ur: NEGATIVE

## 2020-12-06 MED ORDER — METRONIDAZOLE 500 MG PO TABS: 2000.0000 mg | ORAL_TABLET | Freq: Once | ORAL | 0 refills | Status: AC

## 2020-12-06 MED ORDER — LIDOCAINE HCL (PF) 1 % IJ SOLN
INTRAMUSCULAR | Status: AC
  Filled 2020-12-06: qty 2

## 2020-12-06 MED ORDER — CEFTRIAXONE SODIUM 500 MG IJ SOLR
500.0000 mg | Freq: Once | INTRAMUSCULAR | Status: AC
  Administered 2020-12-06: 21:00:00 500 mg via INTRAMUSCULAR

## 2020-12-06 MED ORDER — CEFTRIAXONE SODIUM 500 MG IJ SOLR
INTRAMUSCULAR | Status: AC
Start: 2020-12-06 — End: ?
  Filled 2020-12-06: qty 500

## 2020-12-06 MED ORDER — DOXYCYCLINE HYCLATE 100 MG PO CAPS: 100.0000 mg | ORAL_CAPSULE | Freq: Two times a day (BID) | ORAL | 0 refills | Status: DC

## 2020-12-06 MED ORDER — METRONIDAZOLE 500 MG PO TABS
ORAL_TABLET | ORAL | Status: AC
  Filled 2020-12-06: qty 1

## 2020-12-06 MED ORDER — METRONIDAZOLE 500 MG PO TABS: 2000.0000 mg | ORAL_TABLET | Freq: Once | ORAL | Status: DC

## 2020-12-06 NOTE — Discharge Instructions (Addendum)

## 2020-12-06 NOTE — ED Triage Notes (Signed)
Pt presents with white vaginal discharge. Pt states she is also having lower abdominal pain x 2 days. Pt states she had a new partner 4 days ago. Pt denies having dysuria, urgency and frequency.

## 2020-12-07 ENCOUNTER — Telehealth (HOSPITAL_COMMUNITY): Payer: Self-pay | Admitting: Emergency Medicine

## 2020-12-07 LAB — CERVICOVAGINAL ANCILLARY ONLY
Bacterial Vaginitis (gardnerella): NEGATIVE
Candida Glabrata: NEGATIVE
Candida Vaginitis: POSITIVE — AB
Chlamydia: POSITIVE — AB
Comment: NEGATIVE
Comment: NEGATIVE
Comment: NEGATIVE
Comment: NEGATIVE
Comment: NEGATIVE
Comment: NORMAL
Neisseria Gonorrhea: NEGATIVE
Trichomonas: NEGATIVE

## 2020-12-07 MED ORDER — FLUCONAZOLE 150 MG PO TABS: 150.0000 mg | ORAL_TABLET | Freq: Once | ORAL | 0 refills | Status: AC

## 2020-12-09 NOTE — ED Provider Notes (Signed)
[facility]   [id number] 12/06/20 Arrival Time: 1919  ASSESSMENT & PLAN:  1. Abdominal cramping   2. Vaginal discharge     Also treated with metronidazole.   Discharge Instructions     You have been given the following today for treatment of suspected gonorrhea and/or chlamydia:  cefTRIAXone (ROCEPHIN) injection 500 mg  Please pick up your prescription for doxycycline 100 mg and begin taking twice daily for the next seven (7) days.  Even though we have treated you today, we have sent testing for sexually transmitted infections. We will notify you of any positive results once they are received. If required, we will prescribe any medications you might need.  Please refrain from all sexual activity for at least the next seven days.      Without s/s of PID.  Labs Reviewed  POCT URINALYSIS DIPSTICK, ED / UC - Abnormal; Notable for the following components:      Result Value   Ketones, ur TRACE (*)    Hgb urine dipstick TRACE (*)    All other components within normal limits  CERVICOVAGINAL ANCILLARY ONLY -   POC URINE PREG, ED     Will notify of any positive results. Instructed to refrain from sexual activity for at least seven days.  Reviewed expectations re: course of current medical issues. Questions answered. Outlined signs and symptoms indicating need for more acute intervention. Patient verbalized understanding. After Visit Summary given.   SUBJECTIVE:  Caitlin Sullivan is a 29 y.o. female who presents with complaint of vaginal discharge. Onset gradual. First noticed 1-2 d ago. Describes discharge as thin and opaque; without odor. No specific aggravating or alleviating factors reported. Denies: urinary frequency, dysuria and gross hematuria. Afebrile. Lower abdominal discomfort; mild. Normal PO intake wihout n/v. No genital rashes or lesions. Reports new sexual partner approx 4 d ago. OTC treatment: none. History of STI: none reported.  Patient's last  menstrual period was 11/08/2020 (exact date).   OBJECTIVE:  Vitals:   12/06/20 2004  BP: 117/77  Pulse: 68  Resp: 18  Temp: 98 F (36.7 C)  TempSrc: Oral  SpO2: 97%     General appearance: alert, cooperative, appears stated age and no distress Lungs: unlabored respirations; speaks full sentences without difficulty Back: no CVA tenderness; FROM at waist Abdomen: soft, non-tender GU: deferred Skin: warm and dry Psychological: alert and cooperative; normal mood and affect.  Results for orders placed or performed during the hospital encounter of 12/06/20  POC Urinalysis dipstick  Result Value Ref Range   Glucose, UA NEGATIVE NEGATIVE mg/dL   Bilirubin Urine NEGATIVE NEGATIVE   Ketones, ur TRACE (A) NEGATIVE mg/dL   Specific Gravity, Urine >=1.030 1.005 - 1.030   Hgb urine dipstick TRACE (A) NEGATIVE   pH 6.0 5.0 - 8.0   Protein, ur NEGATIVE NEGATIVE mg/dL   Urobilinogen, UA 0.2 0.0 - 1.0 mg/dL   Nitrite NEGATIVE NEGATIVE   Leukocytes,Ua NEGATIVE NEGATIVE  POC urine pregnancy  Result Value Ref Range   Preg Test, Ur NEGATIVE NEGATIVE  Cervicovaginal ancillary only  Result Value Ref Range   Neisseria Gonorrhea Negative    Chlamydia Positive (A)    Trichomonas Negative    Bacterial Vaginitis (gardnerella) Negative    Candida Vaginitis Positive (A)    Candida Glabrata Negative    Comment      Normal Reference Range Bacterial Vaginosis - Negative   Comment Normal Reference Range Candida Species - Negative    Comment Normal Reference Range Candida  Galbrata - Negative    Comment Normal Reference Range Trichomonas - Negative    Comment Normal Reference Ranger Chlamydia - Negative    Comment      Normal Reference Range Neisseria Gonorrhea - Negative    Labs Reviewed  POCT URINALYSIS DIPSTICK, ED / UC - Abnormal; Notable for the following components:      Result Value   Ketones, ur TRACE (*)    Hgb urine dipstick TRACE (*)    All other components within normal limits   CERVICOVAGINAL ANCILLARY ONLY - Abnormal; Notable for the following components:   Chlamydia Positive (*)    Candida Vaginitis Positive (*)    All other components within normal limits  POC URINE PREG, ED    Not on File  Past Medical History:  Diagnosis Date  . Asthma   . Complication of anesthesia    wakes up combative, fearful of being abducted, needs mother immediately after waking up  . Gall stones   . GERD (gastroesophageal reflux disease)    with pregnancy  . Heart murmur   . Hyperemesis arising during pregnancy   . Iron deficiency anemia   . Umbilical hernia    Family History  Problem Relation Age of Onset  . Cancer Mother   . Hypertension Mother   . Diabetes Father   . Hypertension Father   . Hyperlipidemia Father   . Stroke Father   . Asthma Brother   . Multiple sclerosis Sister    Social History   Socioeconomic History  . Marital status: Single    Spouse name: Not on file  . Number of children: Not on file  . Years of education: Not on file  . Highest education level: Not on file  Occupational History  . Not on file  Tobacco Use  . Smoking status: Former Smoker    Packs/day: 0.25    Years: 5.00    Pack years: 1.25    Types: Cigarettes  . Smokeless tobacco: Never Used  Vaping Use  . Vaping Use: Never used  Substance and Sexual Activity  . Alcohol use: Yes    Comment: Vodka last drink last night   . Drug use: No  . Sexual activity: Never    Birth control/protection: None  Other Topics Concern  . Not on file  Social History Narrative  . Not on file   Social Determinants of Health   Financial Resource Strain: Not on file  Food Insecurity: Not on file  Transportation Needs: Not on file  Physical Activity: Not on file  Stress: Not on file  Social Connections: Not on file  Intimate Partner Violence: Not on file          Mardella Layman, MD 12/09/20 [phone]

## 2023-04-17 ENCOUNTER — Ambulatory Visit (HOSPITAL_COMMUNITY)
Admission: RE | Admit: 2023-04-17 | Discharge: 2023-04-17 | Disposition: A | Discharge status: Home / Self Care | Payer: Medicaid Other | Source: Ambulatory Visit | Attending: Family Medicine | Admitting: Family Medicine

## 2023-04-17 ENCOUNTER — Encounter (HOSPITAL_COMMUNITY): Payer: Self-pay

## 2023-04-17 VITALS — BP 129/85 | HR 71 | Temp 98.7°F | Resp 16

## 2023-04-17 DIAGNOSIS — Z113 Encounter for screening for infections with a predominantly sexual mode of transmission: Secondary | ICD-10-CM | POA: Insufficient documentation

## 2023-04-17 DIAGNOSIS — Z87891 Personal history of nicotine dependence: Secondary | ICD-10-CM | POA: Diagnosis not present

## 2023-04-17 DIAGNOSIS — R1030 Lower abdominal pain, unspecified: Secondary | ICD-10-CM | POA: Diagnosis not present

## 2023-04-17 DIAGNOSIS — N898 Other specified noninflammatory disorders of vagina: Secondary | ICD-10-CM | POA: Diagnosis not present

## 2023-04-17 DIAGNOSIS — R059 Cough, unspecified: Secondary | ICD-10-CM | POA: Diagnosis present

## 2023-04-17 DIAGNOSIS — J069 Acute upper respiratory infection, unspecified: Secondary | ICD-10-CM

## 2023-04-17 DIAGNOSIS — J029 Acute pharyngitis, unspecified: Secondary | ICD-10-CM

## 2023-04-17 DIAGNOSIS — Z1152 Encounter for screening for COVID-19: Secondary | ICD-10-CM | POA: Diagnosis not present

## 2023-04-17 LAB — POCT URINALYSIS DIP (MANUAL ENTRY)
Glucose, UA: NEGATIVE mg/dL
Leukocytes, UA: NEGATIVE
Nitrite, UA: NEGATIVE
Protein Ur, POC: 30 mg/dL — AB
Spec Grav, UA: 1.025 (ref 1.010–1.025)
Urobilinogen, UA: 0.2 E.U./dL
pH, UA: 5.5 (ref 5.0–8.0)

## 2023-04-17 LAB — POCT URINE PREGNANCY: Preg Test, Ur: NEGATIVE

## 2023-04-17 MED ORDER — METRONIDAZOLE 500 MG PO TABS: 500.0000 mg | ORAL_TABLET | Freq: Two times a day (BID) | ORAL | 0 refills | Status: DC

## 2023-04-17 NOTE — ED Provider Notes (Signed)
[facility]    CSN: [id number] Arrival date & time: 04/17/23  1252      History   Chief Complaint Chief Complaint  Patient presents with   SEXUALLY TRANSMITTED DISEASE    New partner experiencing back and lower abdominal pain, also like to be seen for possible cold or sinus infection - Entered by patient   Abdominal Pain   std testing   Nasal Congestion    HPI Caitlin Sullivan is a 32 y.o. female.   She started off with cold like symptoms about 2 days ago with with cough, sore throat, sinus congestion and drainage.  She is also having low back pain and low abdominal pain.  Woke up not feeling well.  Also with fevers/chills, low grade 99.8.  No n/v, decreased appetite.  No frequency or dysuria.  Some diarrhea, no constipation.  Some vaginal d/c, white, milkly in color.  No vaginal itching.  New sexual partner 5 days ago, not sure if related.  No protection.  Was both vaginal and oral.  No known exposure.  Took tyelnol cold/flu and mucinex.  LMP was 04/02/23.  She states she gets BV often.  The d/c is similar, but not the abd pain/back pain.       Past Medical History:  Diagnosis Date   Asthma    Complication of anesthesia    wakes up combative, fearful of being abducted, needs mother immediately after waking up   Gall stones    GERD (gastroesophageal reflux disease)    with pregnancy   Heart murmur    Hyperemesis arising during pregnancy    Iron deficiency anemia    Umbilical hernia     Patient Active Problem List   Diagnosis Date Noted   Leakage, amniotic fluid 12/21/2015   Supervision of normal pregnancy in third trimester 10/04/2015   Tobacco smoking affecting pregnancy in third trimester, antepartum 10/04/2015   Marijuana use 10/04/2015   Sudden onset of severe abdominal pain    Cholelithiasis complicating pregnancy in third trimester, antepartum 05/15/2013   Hyperemesis gravidarum 01/07/2013   Asthma 01/07/2013    Past Surgical History:   Procedure Laterality Date   CHOLECYSTECTOMY     CHOLECYSTECTOMY N/A 09/22/2014   Procedure: LAPAROSCOPIC CHOLECYSTECTOMY;  Surgeon: Axel Filler, MD;  Location: [facility] OR;  Service: General;  Laterality: N/A;   LAPAROSCOPIC TUBAL LIGATION Bilateral 07/02/2016   Procedure: LAPAROSCOPIC BILATERAL TUBAL LIGATION;  Surgeon: Adam Phenix, MD;  Location: [facility]S;  Service: Gynecology;  Laterality: Bilateral;   PILONIDAL CYST / SINUS EXCISION      OB History     Gravida  3   Para  2   Term  2   Preterm      AB  1   Living  2      SAB      IAB  1   Ectopic      Multiple  0   Live Births  2            Home Medications    Prior to Admission medications   Medication Sig Start Date End Date Taking? Authorizing Provider  doxycycline (VIBRAMYCIN) 100 MG capsule Take 1 capsule (100 mg total) by mouth 2 (two) times daily. 12/06/20   Mardella Layman, MD  famotidine (PEPCID) 20 MG tablet Take 1 tablet (20 mg total) by mouth 2 (two) times daily. 11/22/17   Renne Crigler, PA-C  fluticasone (FLONASE) 50 MCG/ACT nasal spray Place 1 spray into both nostrils  daily. 08/14/18   Graciella Freer A, PA-C  ondansetron (ZOFRAN ODT) 4 MG disintegrating tablet Take 1 tablet (4 mg total) by mouth every 8 (eight) hours as needed for nausea or vomiting. 11/22/17   Renne Crigler, PA-C  ondansetron (ZOFRAN ODT) 4 MG disintegrating tablet Take 1 tablet (4 mg total) by mouth every 8 (eight) hours as needed for nausea or vomiting. 03/19/18   Street, [place], PA-C  oseltamivir (TAMIFLU) 75 MG capsule Take 1 capsule (75 mg total) by mouth every 12 (twelve) hours. 03/19/18   Street, [place], PA-C  phenol (CHLORASEPTIC) 1.4 % LIQD Use as directed 1 spray in the mouth or throat as needed for throat irritation / pain. 08/26/18   Caccavale, Sophia, PA-C    Family History Family History  Problem Relation Age of Onset   Cancer Mother    Hypertension Mother    Diabetes Father    Hypertension Father     Hyperlipidemia Father    Stroke Father    Asthma Brother    Multiple sclerosis Sister     Social History Social History   Tobacco Use   Smoking status: Former    Packs/day: 0.25    Years: 5.00    Additional pack years: 0.00    Total pack years: 1.25    Types: Cigarettes   Smokeless tobacco: Never  Vaping Use   Vaping Use: Never used  Substance Use Topics   Alcohol use: Yes   Drug use: No     Allergies   Patient has no known allergies.   Review of Systems Review of Systems  Constitutional:  Positive for fatigue and fever.  HENT:  Positive for congestion and sore throat.   Respiratory:  Positive for cough.   Gastrointestinal:  Positive for abdominal pain and diarrhea. Negative for vomiting.  Genitourinary:  Positive for vaginal discharge. Negative for frequency and urgency.  Musculoskeletal: Negative.   Skin: Negative.      Physical Exam Triage Vital Signs ED Triage Vitals  Enc Vitals Group     BP 04/17/23 1304 129/85     Pulse Rate 04/17/23 1304 71     Resp 04/17/23 1304 16     Temp 04/17/23 1304 98.7 F (37.1 C)     Temp Source 04/17/23 1304 Oral     SpO2 04/17/23 1304 94 %     Weight --      Height --      Head Circumference --      Peak Flow --      Pain Score 04/17/23 1306 6     Pain Loc --      Pain Edu? --      Excl. in GC? --    No data found.  Updated Vital Signs BP 129/85 (BP Location: Right Arm)   Pulse 71   Temp 98.7 F (37.1 C) (Oral)   Resp 16   LMP 04/02/2023 (Approximate)   SpO2 94%   Visual Acuity Right Eye Distance:   Left Eye Distance:   Bilateral Distance:    Right Eye Near:   Left Eye Near:    Bilateral Near:     Physical Exam Constitutional:      General: She is not in acute distress.    Appearance: She is well-developed. She is not ill-appearing.  HENT:     Head: Normocephalic.     Nose:     Right Sinus: Maxillary sinus tenderness present.     Left Sinus: Maxillary sinus tenderness present.  Mouth/Throat:     Mouth: Mucous membranes are moist.     Pharynx: Posterior oropharyngeal erythema present. No oropharyngeal exudate.  Cardiovascular:     Rate and Rhythm: Normal rate and regular rhythm.  Pulmonary:     Effort: Pulmonary effort is normal.     Breath sounds: Normal breath sounds.  Abdominal:     General: Bowel sounds are normal.     Palpations: Abdomen is soft.     Tenderness: There is abdominal tenderness in the suprapubic area. There is no right CVA tenderness or left CVA tenderness.  Musculoskeletal:     Cervical back: Normal range of motion and neck supple. No tenderness.  Skin:    General: Skin is warm.  Neurological:     General: No focal deficit present.     Mental Status: She is alert.      UC Treatments / Results  Labs (all labs ordered are listed, but only abnormal results are displayed) Labs Reviewed  SARS CORONAVIRUS 2 (TAT 6-24 HRS)  POCT URINALYSIS DIP (MANUAL ENTRY)  POCT URINE PREGNANCY  CERVICOVAGINAL ANCILLARY ONLY  CYTOLOGY, (ORAL, ANAL, URETHRAL) ANCILLARY ONLY    EKG   Radiology No results found.  Procedures Procedures (including critical care time)  Medications Ordered in UC Medications - No data to display  Initial Impression / Assessment and Plan / UC Course  I have reviewed the triage vital signs and the nursing notes.  Pertinent labs & imaging results that were available during my care of the patient were reviewed by me and considered in my medical decision making (see chart for details).  Final Clinical Impressions(s) / UC Diagnoses   Final diagnoses:  Vaginal discharge  Lower abdominal pain  Upper respiratory tract infection, unspecified type  Sore throat     Discharge Instructions      You were seen today for various symptoms.  I sent out flagyl to treat possible BV.  The vaginal and oral swab will be resulted tomorrow and if anything is positive that needs to be treated you will be notified.  Your urine  does not show overt infection today.  The rest of your symptoms appear viral in nature.  I have swabbed you for covid and this will also be resulted tomorrow.  You will be notified if positive.  All of your results can be seen on mychart as well.     ED Prescriptions     Medication Sig Dispense Auth. Provider   metroNIDAZOLE (FLAGYL) 500 MG tablet Take 1 tablet (500 mg total) by mouth 2 (two) times daily. 14 tablet Jannifer Franklin, MD      PDMP not reviewed this encounter.   Jannifer Franklin, MD 04/17/23 1340

## 2023-04-17 NOTE — Discharge Instructions (Addendum)
You were seen today for various symptoms.  I sent out flagyl to treat possible BV.  The vaginal and oral swab will be resulted tomorrow and if anything is positive that needs to be treated you will be notified.  Your urine does not show overt infection today.  The rest of your symptoms appear viral in nature.  I have swabbed you for covid and this will also be resulted tomorrow.  You will be notified if positive.  All of your results can be seen on mychart as well.

## 2023-04-17 NOTE — ED Triage Notes (Signed)
Patient states that she has a new sexual partner and is now having bilateral  lower back pain, bilateral abdominal pain, sore throat, and nasal congestion. Patient states she did have unprotected sex and oral sex.

## 2023-04-18 LAB — CERVICOVAGINAL ANCILLARY ONLY
Bacterial Vaginitis (gardnerella): NEGATIVE
Candida Glabrata: NEGATIVE
Candida Vaginitis: NEGATIVE
Chlamydia: NEGATIVE
Comment: NEGATIVE
Comment: NEGATIVE
Comment: NEGATIVE
Comment: NEGATIVE
Comment: NEGATIVE
Comment: NORMAL
Neisseria Gonorrhea: NEGATIVE
Trichomonas: NEGATIVE

## 2023-04-18 LAB — CYTOLOGY, (ORAL, ANAL, URETHRAL) ANCILLARY ONLY
Chlamydia: NEGATIVE
Comment: NEGATIVE
Comment: NEGATIVE
Comment: NORMAL
Neisseria Gonorrhea: NEGATIVE
Trichomonas: NEGATIVE

## 2023-04-18 LAB — SARS CORONAVIRUS 2 (TAT 6-24 HRS): SARS Coronavirus 2: NEGATIVE

## 2023-07-02 ENCOUNTER — Encounter (HOSPITAL_COMMUNITY): Payer: Self-pay

## 2023-07-02 ENCOUNTER — Ambulatory Visit (HOSPITAL_COMMUNITY)
Admission: RE | Admit: 2023-07-02 | Discharge: 2023-07-02 | Disposition: A | Discharge status: Home / Self Care | Payer: Medicaid Other | Source: Ambulatory Visit | Attending: Nurse Practitioner | Admitting: Nurse Practitioner

## 2023-07-02 VITALS — BP 131/82 | HR 59 | Temp 98.2°F | Resp 17

## 2023-07-02 DIAGNOSIS — N898 Other specified noninflammatory disorders of vagina: Secondary | ICD-10-CM | POA: Diagnosis present

## 2023-07-02 LAB — HIV ANTIBODY (ROUTINE TESTING W REFLEX): HIV Screen 4th Generation wRfx: NONREACTIVE

## 2023-07-02 LAB — POCT URINALYSIS DIP (MANUAL ENTRY)
Bilirubin, UA: NEGATIVE
Blood, UA: NEGATIVE
Glucose, UA: NEGATIVE mg/dL
Leukocytes, UA: NEGATIVE
Nitrite, UA: NEGATIVE
Protein Ur, POC: NEGATIVE mg/dL
Spec Grav, UA: 1.025 (ref 1.010–1.025)
Urobilinogen, UA: 1 E.U./dL
pH, UA: 7 (ref 5.0–8.0)

## 2023-07-02 NOTE — Discharge Instructions (Addendum)
Urine sample today is clear.  No signs of infection.    We will contact you if any of the testing from today comes back positive.  In the meantime, recommend condom use with every sexual encounter.  Increase water intake.  Have a great birthday tomorrow!!

## 2023-07-02 NOTE — ED Triage Notes (Signed)
Pt c/o chunky vaginal discharge that is whitish and slight odor but not "fishy" for about a week. Would like STD testing.

## 2023-07-02 NOTE — ED Provider Notes (Signed)
[facility]    CSN: [id number] Arrival date & time: 07/02/23  1527      History   Chief Complaint Chief Complaint  Patient presents with   Vaginal Discharge    HPI Caitlin Sullivan is a 32 y.o. female.   Patient presents today for 2-week history of increased urinary frequency, voiding smaller amounts, cloudy urine, foul urinary odor, and vaginal discharge.  She reports the vaginal discharge is colorless, clumpy, and is malodorous.  She denies vaginal itching, dysuria, urinary urgency, hematuria, abdominal pain or back pain, fever, nausea/vomiting.  No vaginal sores, rashes, or lesions that she is concerned about.  Reports she has frequent history of BV and self treated for this with an old prescription without improvement in symptoms.  She is requesting STI testing today as she has recently had unprotected sexual intercourse.  No known exposures to STI.  LMP 06/20/2023.    Past Medical History:  Diagnosis Date   Asthma    Complication of anesthesia    wakes up combative, fearful of being abducted, needs mother immediately after waking up   Gall stones    GERD (gastroesophageal reflux disease)    with pregnancy   Heart murmur    Hyperemesis arising during pregnancy    Iron deficiency anemia    Umbilical hernia     Patient Active Problem List   Diagnosis Date Noted   Leakage, amniotic fluid 12/21/2015   Supervision of normal pregnancy in third trimester 10/04/2015   Tobacco smoking affecting pregnancy in third trimester, antepartum 10/04/2015   Marijuana use 10/04/2015   Sudden onset of severe abdominal pain    Cholelithiasis complicating pregnancy in third trimester, antepartum 05/15/2013   Hyperemesis gravidarum 01/07/2013   Asthma 01/07/2013    Past Surgical History:  Procedure Laterality Date   CHOLECYSTECTOMY     CHOLECYSTECTOMY N/A 09/22/2014   Procedure: LAPAROSCOPIC CHOLECYSTECTOMY;  Surgeon: Axel Filler, MD;  Location: [facility];  Service: General;   Laterality: N/A;   LAPAROSCOPIC TUBAL LIGATION Bilateral 07/02/2016   Procedure: LAPAROSCOPIC BILATERAL TUBAL LIGATION;  Surgeon: Adam Phenix, MD;  Location: [facility];  Service: Gynecology;  Laterality: Bilateral;   PILONIDAL CYST / SINUS EXCISION      OB History     Gravida  3   Para  2   Term  2   Preterm      AB  1   Living  2      SAB      IAB  1   Ectopic      Multiple  0   Live Births  2            Home Medications    Prior to Admission medications   Not on File    Family History Family History  Problem Relation Age of Onset   Cancer Mother    Hypertension Mother    Diabetes Father    Hypertension Father    Hyperlipidemia Father    Stroke Father    Asthma Brother    Multiple sclerosis Sister     Social History Social History   Tobacco Use   Smoking status: Former    Packs/day: 0.25    Years: 5.00    Additional pack years: 0.00    Total pack years: 1.25    Types: Cigarettes   Smokeless tobacco: Never  Vaping Use   Vaping Use: Never used  Substance Use Topics   Alcohol use: Yes   Drug use: No  Allergies   Patient has no known allergies.   Review of Systems Review of Systems Per HPI  Physical Exam Triage Vital Signs ED Triage Vitals  Enc Vitals Group     BP 07/02/23 1543 131/82     Pulse Rate 07/02/23 1543 (!) 59     Resp 07/02/23 1543 17     Temp 07/02/23 1543 98.2 F (36.8 C)     Temp Source 07/02/23 1543 Oral     SpO2 07/02/23 1543 97 %     Weight --      Height --      Head Circumference --      Peak Flow --      Pain Score 07/02/23 1542 0     Pain Loc --      Pain Edu? --      Excl. in GC? --    No data found.  Updated Vital Signs BP 131/82 (BP Location: Right Arm)   Pulse (!) 59   Temp 98.2 F (36.8 C) (Oral)   Resp 17   LMP 06/20/2023   SpO2 97%   Visual Acuity Right Eye Distance:   Left Eye Distance:   Bilateral Distance:    Right Eye Near:   Left Eye Near:    Bilateral Near:      Physical Exam Vitals and nursing note reviewed.  Constitutional:      General: She is not in acute distress.    Appearance: Normal appearance. She is not toxic-appearing.  Pulmonary:     Effort: Pulmonary effort is normal. No respiratory distress.  Skin:    General: Skin is warm and dry.     Coloration: Skin is not jaundiced or pale.     Findings: No erythema.  Neurological:     Mental Status: She is alert and oriented to person, place, and time.     Motor: No weakness.     Gait: Gait normal.  Psychiatric:        Behavior: Behavior is cooperative.      UC Treatments / Results  Labs (all labs ordered are listed, but only abnormal results are displayed) Labs Reviewed  POCT URINALYSIS DIP (MANUAL ENTRY) - Abnormal; Notable for the following components:      Result Value   Ketones, POC UA trace (5) (*)    All other components within normal limits  HIV ANTIBODY (ROUTINE TESTING W REFLEX)  RPR  CERVICOVAGINAL ANCILLARY ONLY    EKG   Radiology No results found.  Procedures Procedures (including critical care time)  Medications Ordered in UC Medications - No data to display  Initial Impression / Assessment and Plan / UC Course  I have reviewed the triage vital signs and the nursing notes.  Pertinent labs & imaging results that were available during my care of the patient were reviewed by me and considered in my medical decision making (see chart for details).   Patient is well-appearing, normotensive, afebrile, not tachycardic, not tachypneic, oxygenating well on room air.    1. Vaginal discharge Urinalysis today is unrevealing Vaginal cytology is pending HIV and syphilis testing is also pending Treat as indicated Recommended increasing water intake, recommend condom use with every sexual encounter going forward  The patient was given the opportunity to ask questions.  All questions answered to their satisfaction.  The patient is in agreement to this plan.     Final Clinical Impressions(s) / UC Diagnoses   Final diagnoses:  Vaginal discharge  Discharge Instructions      Urine sample today is clear.  No signs of infection.    We will contact you if any of the testing from today comes back positive.  In the meantime, recommend condom use with every sexual encounter.  Increase water intake.  Have a great birthday tomorrow!!     ED Prescriptions   None    PDMP not reviewed this encounter.   Valentino Nose, NP 07/02/23 1623

## 2023-07-03 LAB — CERVICOVAGINAL ANCILLARY ONLY
Bacterial Vaginitis (gardnerella): NEGATIVE
Candida Glabrata: NEGATIVE
Candida Vaginitis: NEGATIVE
Chlamydia: NEGATIVE
Comment: NEGATIVE
Comment: NEGATIVE
Comment: NEGATIVE
Comment: NEGATIVE
Comment: NEGATIVE
Comment: NORMAL
Neisseria Gonorrhea: NEGATIVE
Trichomonas: NEGATIVE

## 2023-07-03 LAB — RPR: RPR Ser Ql: NONREACTIVE

## 2023-07-10 ENCOUNTER — Ambulatory Visit (HOSPITAL_COMMUNITY): Payer: Self-pay

## 2023-12-06 ENCOUNTER — Encounter (HOSPITAL_COMMUNITY): Payer: Self-pay

## 2023-12-06 ENCOUNTER — Ambulatory Visit (HOSPITAL_COMMUNITY)
Admission: RE | Admit: 2023-12-06 | Discharge: 2023-12-06 | Disposition: A | Discharge status: Home / Self Care | Payer: Self-pay | Source: Ambulatory Visit | Attending: Emergency Medicine | Admitting: Emergency Medicine

## 2023-12-06 VITALS — BP 131/78 | HR 46 | Temp 98.1°F | Resp 16

## 2023-12-06 DIAGNOSIS — Z113 Encounter for screening for infections with a predominantly sexual mode of transmission: Secondary | ICD-10-CM | POA: Diagnosis present

## 2023-12-06 LAB — POCT URINALYSIS DIP (MANUAL ENTRY)
Bilirubin, UA: NEGATIVE
Blood, UA: NEGATIVE
Glucose, UA: NEGATIVE mg/dL
Ketones, POC UA: NEGATIVE mg/dL
Leukocytes, UA: NEGATIVE
Nitrite, UA: NEGATIVE
Protein Ur, POC: NEGATIVE mg/dL
Spec Grav, UA: 1.02 (ref 1.010–1.025)
Urobilinogen, UA: 0.2 U/dL
pH, UA: 6.5 (ref 5.0–8.0)

## 2023-12-06 NOTE — ED Triage Notes (Signed)
Pt is here for STI-testing and a urine test.Pt reports some vaginal discomfort. Pt does not want blood work today.

## 2023-12-06 NOTE — Discharge Instructions (Signed)
We will call you if anything on your swab returns positive. You can also see these results on MyChart. Please abstain from sexual intercourse until your results return.

## 2023-12-06 NOTE — ED Provider Notes (Signed)
[facility]    CSN: [id number] Arrival date & time: 12/06/23  1434     History   Chief Complaint Chief Complaint  Patient presents with   Exposure to STD    Entered by patient    HPI Caitlin Sullivan is a 32 y.o. female.  Presents for STD testing Reports new sexual partner.  No known exposure She is having some vaginal discomfort with some discharge. No itching or odor. Denies rash  Also having some irritation with urination.  Has been trying to drink fluids No fever or flank pain 12/5  Past Medical History:  Diagnosis Date   Asthma    Complication of anesthesia    wakes up combative, fearful of being abducted, needs mother immediately after waking up   Gall stones    GERD (gastroesophageal reflux disease)    with pregnancy   Heart murmur    Hyperemesis arising during pregnancy    Iron deficiency anemia    Umbilical hernia     Patient Active Problem List   Diagnosis Date Noted   Leakage, amniotic fluid 12/21/2015   Supervision of normal pregnancy in third trimester 10/04/2015   Tobacco smoking affecting pregnancy in third trimester 10/04/2015   Marijuana use 10/04/2015   Sudden onset of severe abdominal pain    Cholelithiasis complicating pregnancy in third trimester, antepartum 05/15/2013   Hyperemesis gravidarum 01/07/2013   Asthma 01/07/2013    Past Surgical History:  Procedure Laterality Date   CHOLECYSTECTOMY     CHOLECYSTECTOMY N/A 09/22/2014   Procedure: LAPAROSCOPIC CHOLECYSTECTOMY;  Surgeon: Axel Filler, MD;  Location: [facility];  Service: General;  Laterality: N/A;   LAPAROSCOPIC TUBAL LIGATION Bilateral 07/02/2016   Procedure: LAPAROSCOPIC BILATERAL TUBAL LIGATION;  Surgeon: Adam Phenix, MD;  Location: [facility];  Service: Gynecology;  Laterality: Bilateral;   PILONIDAL CYST / SINUS EXCISION      OB History     Gravida  3   Para  2   Term  2   Preterm      AB  1   Living  2      SAB      IAB  1   Ectopic      Multiple   0   Live Births  2            Home Medications    Prior to Admission medications   Not on File    Family History Family History  Problem Relation Age of Onset   Cancer Mother    Hypertension Mother    Diabetes Father    Hypertension Father    Hyperlipidemia Father    Stroke Father    Asthma Brother    Multiple sclerosis Sister     Social History Social History   Tobacco Use   Smoking status: Former    Current packs/day: 0.25    Average packs/day: 0.3 packs/day for 5.0 years (1.3 ttl pk-yrs)    Types: Cigarettes   Smokeless tobacco: Never  Vaping Use   Vaping status: Never Used  Substance Use Topics   Alcohol use: Yes   Drug use: No     Allergies   Patient has no allergy information on record.   Review of Systems Review of Systems Per HPI  Physical Exam Triage Vital Signs ED Triage Vitals [12/06/23 1528]  Encounter Vitals Group     BP 131/78     Systolic BP Percentile      Diastolic BP Percentile  Pulse Rate (!) 46     Resp 16     Temp 98.1 F (36.7 C)     Temp Source Oral     SpO2 99 %     Weight      Height      Head Circumference      Peak Flow      Pain Score      Pain Loc      Pain Education      Exclude from Growth Chart    No data found.  Updated Vital Signs BP 131/78 (BP Location: Left Arm)   Pulse (!) 46   Temp 98.1 F (36.7 C) (Oral)   Resp 16   LMP 11/27/2023 (Approximate)   SpO2 99%   Physical Exam Vitals and nursing note reviewed.  Constitutional:      General: She is not in acute distress.    Appearance: Normal appearance.  HENT:     Mouth/Throat:     Pharynx: Oropharynx is clear.  Cardiovascular:     Rate and Rhythm: Normal rate and regular rhythm.     Pulses: Normal pulses.     Heart sounds: Normal heart sounds.  Pulmonary:     Effort: Pulmonary effort is normal.     Breath sounds: Normal breath sounds.  Abdominal:     Palpations: Abdomen is soft.     Tenderness: There is no abdominal  tenderness. There is no right CVA tenderness, left CVA tenderness or guarding.  Neurological:     Mental Status: She is alert and oriented to person, place, and time.      UC Treatments / Results  Labs (all labs ordered are listed, but only abnormal results are displayed) Labs Reviewed  POCT URINALYSIS DIP (MANUAL ENTRY)  CERVICOVAGINAL ANCILLARY ONLY    EKG  Radiology No results found.  Procedures Procedures   Medications Ordered in UC Medications - No data to display  Initial Impression / Assessment and Plan / UC Course  I have reviewed the triage vital signs and the nursing notes.  Pertinent labs & imaging results that were available during my care of the patient were reviewed by me and considered in my medical decision making (see chart for details).  UA unremarkable Cytology swab pending. Patient has history of antibiotic associated yeast infection.  If she requires antibiotic treatment, please send fluconazole 2 dose concurrently.  Final Clinical Impressions(s) / UC Diagnoses   Final diagnoses:  Screen for STD (sexually transmitted disease)     Discharge Instructions      We will call you if anything on your swab returns positive. You can also see these results on MyChart. Please abstain from sexual intercourse until your results return.     ED Prescriptions   None    PDMP not reviewed this encounter.   Marlow Baars, [place] 12/06/23 [id number]

## 2023-12-08 LAB — CERVICOVAGINAL ANCILLARY ONLY
Bacterial Vaginitis (gardnerella): NEGATIVE
Candida Glabrata: NEGATIVE
Candida Vaginitis: POSITIVE — AB
Chlamydia: NEGATIVE
Comment: NEGATIVE
Comment: NEGATIVE
Comment: NEGATIVE
Comment: NEGATIVE
Comment: NEGATIVE
Comment: NORMAL
Neisseria Gonorrhea: NEGATIVE
Trichomonas: POSITIVE — AB

## 2023-12-10 ENCOUNTER — Telehealth (HOSPITAL_COMMUNITY): Payer: Self-pay

## 2023-12-10 MED ORDER — FLUCONAZOLE 150 MG PO TABS: 150.0000 mg | ORAL_TABLET | Freq: Once | ORAL | 0 refills | Status: AC

## 2023-12-10 MED ORDER — METRONIDAZOLE 500 MG PO TABS: 2000.0000 mg | ORAL_TABLET | Freq: Once | ORAL | 0 refills | Status: AC

## 2023-12-10 NOTE — Telephone Encounter (Signed)
Per protocol, pt requires tx with metronidazole. Reviewed with patient, verified pharmacy, prescription sent.

## 2024-01-13 ENCOUNTER — Ambulatory Visit (HOSPITAL_COMMUNITY): Payer: Self-pay
